# Patient Record
Sex: Male | Born: 1945 | Race: White | Hispanic: No | Smoking: Former smoker
Health system: Southern US, Community
[De-identification: ages and names within clinical notes are randomized; demographics above are authoritative.]

## PROBLEM LIST (undated history)

## (undated) DIAGNOSIS — C189 Malignant neoplasm of colon, unspecified: Secondary | ICD-10-CM

## (undated) DIAGNOSIS — G4733 Obstructive sleep apnea (adult) (pediatric): Secondary | ICD-10-CM

## (undated) DIAGNOSIS — R0789 Other chest pain: Secondary | ICD-10-CM

## (undated) DIAGNOSIS — D509 Iron deficiency anemia, unspecified: Secondary | ICD-10-CM

## (undated) DIAGNOSIS — E119 Type 2 diabetes mellitus without complications: Secondary | ICD-10-CM

## (undated) DIAGNOSIS — I219 Acute myocardial infarction, unspecified: Secondary | ICD-10-CM

## (undated) DIAGNOSIS — I2510 Atherosclerotic heart disease of native coronary artery without angina pectoris: Secondary | ICD-10-CM

## (undated) DIAGNOSIS — I739 Peripheral vascular disease, unspecified: Secondary | ICD-10-CM

## (undated) DIAGNOSIS — I639 Cerebral infarction, unspecified: Secondary | ICD-10-CM

## (undated) DIAGNOSIS — E669 Obesity, unspecified: Secondary | ICD-10-CM

## (undated) DIAGNOSIS — M489 Spondylopathy, unspecified: Secondary | ICD-10-CM

## (undated) DIAGNOSIS — G40909 Epilepsy, unspecified, not intractable, without status epilepticus: Secondary | ICD-10-CM

## (undated) DIAGNOSIS — K219 Gastro-esophageal reflux disease without esophagitis: Secondary | ICD-10-CM

## (undated) DIAGNOSIS — E782 Mixed hyperlipidemia: Secondary | ICD-10-CM

## (undated) DIAGNOSIS — I779 Disorder of arteries and arterioles, unspecified: Secondary | ICD-10-CM

## (undated) DIAGNOSIS — I480 Paroxysmal atrial fibrillation: Secondary | ICD-10-CM

## (undated) HISTORY — DX: Gastro-esophageal reflux disease without esophagitis: K21.9

## (undated) HISTORY — PX: OTHER SURGICAL HISTORY: SHX169

## (undated) HISTORY — PX: FEMORAL ARTERY - POPLITEAL ARTERY BYPASS GRAFT: SUR180

## (undated) HISTORY — DX: Peripheral vascular disease, unspecified: I73.9

## (undated) HISTORY — DX: Type 2 diabetes mellitus without complications: E11.9

## (undated) HISTORY — DX: Disorder of arteries and arterioles, unspecified: I77.9

## (undated) HISTORY — DX: Mixed hyperlipidemia: E78.2

## (undated) HISTORY — PX: CAROTID ENDARTERECTOMY: SUR193

## (undated) HISTORY — DX: Obstructive sleep apnea (adult) (pediatric): G47.33

## (undated) HISTORY — DX: Atherosclerotic heart disease of native coronary artery without angina pectoris: I25.10

## (undated) HISTORY — DX: Paroxysmal atrial fibrillation: I48.0

## (undated) HISTORY — PX: PARTIAL COLECTOMY: SHX5273

## (undated) HISTORY — DX: Spondylopathy, unspecified: M48.9

## (undated) HISTORY — PX: CHOLECYSTECTOMY: SHX55

## (undated) HISTORY — DX: Iron deficiency anemia, unspecified: D50.9

## (undated) HISTORY — PX: TONSILLECTOMY AND ADENOIDECTOMY: SUR1326

## (undated) HISTORY — DX: Obesity, unspecified: E66.9

## (undated) HISTORY — DX: Epilepsy, unspecified, not intractable, without status epilepticus: G40.909

## (undated) HISTORY — DX: Cerebral infarction, unspecified: I63.9

## (undated) HISTORY — DX: Other chest pain: R07.89

---

## 1997-09-27 ENCOUNTER — Ambulatory Visit (HOSPITAL_COMMUNITY): Admission: RE | Admit: 1997-09-27 | Discharge: 1997-09-27 | Payer: Self-pay | Admitting: Neurosurgery

## 1997-10-30 ENCOUNTER — Ambulatory Visit (HOSPITAL_COMMUNITY): Admission: RE | Admit: 1997-10-30 | Discharge: 1997-10-30 | Payer: Self-pay | Admitting: Neurosurgery

## 1999-09-01 ENCOUNTER — Encounter: Payer: Self-pay | Admitting: Emergency Medicine

## 1999-09-01 ENCOUNTER — Inpatient Hospital Stay (HOSPITAL_COMMUNITY): Admission: EM | Admit: 1999-09-01 | Discharge: 1999-09-07 | Payer: Self-pay | Admitting: Emergency Medicine

## 1999-11-16 ENCOUNTER — Encounter: Admission: RE | Admit: 1999-11-16 | Discharge: 1999-11-16 | Payer: Self-pay | Admitting: Orthopedic Surgery

## 1999-11-16 ENCOUNTER — Encounter: Payer: Self-pay | Admitting: Orthopedic Surgery

## 1999-12-31 ENCOUNTER — Encounter: Payer: Self-pay | Admitting: Orthopedic Surgery

## 2000-01-06 ENCOUNTER — Inpatient Hospital Stay (HOSPITAL_COMMUNITY): Admission: RE | Admit: 2000-01-06 | Discharge: 2000-01-07 | Payer: Self-pay | Admitting: Orthopedic Surgery

## 2000-02-02 ENCOUNTER — Ambulatory Visit (HOSPITAL_COMMUNITY): Admission: RE | Admit: 2000-02-02 | Discharge: 2000-02-02 | Payer: Self-pay | Admitting: *Deleted

## 2000-02-18 ENCOUNTER — Ambulatory Visit (HOSPITAL_COMMUNITY): Admission: RE | Admit: 2000-02-18 | Discharge: 2000-02-19 | Payer: Self-pay | Admitting: *Deleted

## 2000-02-21 ENCOUNTER — Inpatient Hospital Stay (HOSPITAL_COMMUNITY): Admission: EM | Admit: 2000-02-21 | Discharge: 2000-02-23 | Payer: Self-pay | Admitting: Interventional Cardiology

## 2001-02-22 ENCOUNTER — Encounter: Payer: Self-pay | Admitting: *Deleted

## 2001-02-24 ENCOUNTER — Ambulatory Visit (HOSPITAL_COMMUNITY): Admission: RE | Admit: 2001-02-24 | Discharge: 2001-02-24 | Payer: Self-pay | Admitting: *Deleted

## 2001-03-28 ENCOUNTER — Inpatient Hospital Stay (HOSPITAL_COMMUNITY): Admission: RE | Admit: 2001-03-28 | Discharge: 2001-04-01 | Payer: Self-pay | Admitting: *Deleted

## 2001-03-29 ENCOUNTER — Encounter: Payer: Self-pay | Admitting: *Deleted

## 2002-04-19 DIAGNOSIS — I639 Cerebral infarction, unspecified: Secondary | ICD-10-CM

## 2002-04-19 HISTORY — DX: Cerebral infarction, unspecified: I63.9

## 2002-09-11 ENCOUNTER — Encounter: Payer: Self-pay | Admitting: *Deleted

## 2002-09-13 ENCOUNTER — Ambulatory Visit (HOSPITAL_COMMUNITY): Admission: RE | Admit: 2002-09-13 | Discharge: 2002-09-13 | Payer: Self-pay | Admitting: *Deleted

## 2002-09-19 ENCOUNTER — Inpatient Hospital Stay (HOSPITAL_COMMUNITY): Admission: RE | Admit: 2002-09-19 | Discharge: 2002-09-23 | Payer: Self-pay | Admitting: *Deleted

## 2002-09-20 ENCOUNTER — Encounter: Payer: Self-pay | Admitting: *Deleted

## 2003-04-17 ENCOUNTER — Inpatient Hospital Stay (HOSPITAL_COMMUNITY): Admission: EM | Admit: 2003-04-17 | Discharge: 2003-04-27 | Payer: Self-pay | Admitting: Neurology

## 2003-04-25 ENCOUNTER — Encounter (INDEPENDENT_AMBULATORY_CARE_PROVIDER_SITE_OTHER): Payer: Self-pay | Admitting: Specialist

## 2003-05-07 ENCOUNTER — Encounter (HOSPITAL_COMMUNITY): Admission: RE | Admit: 2003-05-07 | Discharge: 2003-06-06 | Payer: Self-pay | Admitting: Neurology

## 2003-06-11 ENCOUNTER — Encounter (HOSPITAL_COMMUNITY): Admission: RE | Admit: 2003-06-11 | Discharge: 2003-07-11 | Payer: Self-pay | Admitting: Neurology

## 2003-07-16 ENCOUNTER — Encounter (HOSPITAL_COMMUNITY): Admission: RE | Admit: 2003-07-16 | Discharge: 2003-08-15 | Payer: Self-pay | Admitting: Neurology

## 2003-08-20 ENCOUNTER — Encounter (HOSPITAL_COMMUNITY): Admission: RE | Admit: 2003-08-20 | Discharge: 2003-09-19 | Payer: Self-pay | Admitting: Neurology

## 2003-09-19 ENCOUNTER — Encounter (HOSPITAL_COMMUNITY): Admission: RE | Admit: 2003-09-19 | Discharge: 2003-10-22 | Payer: Self-pay | Admitting: Neurology

## 2003-10-24 ENCOUNTER — Encounter (HOSPITAL_COMMUNITY): Admission: RE | Admit: 2003-10-24 | Discharge: 2003-11-23 | Payer: Self-pay | Admitting: Neurology

## 2003-11-26 ENCOUNTER — Encounter (HOSPITAL_COMMUNITY): Admission: RE | Admit: 2003-11-26 | Discharge: 2003-12-26 | Payer: Self-pay | Admitting: Neurology

## 2004-01-22 ENCOUNTER — Encounter (HOSPITAL_COMMUNITY): Admission: RE | Admit: 2004-01-22 | Discharge: 2004-02-25 | Payer: Self-pay | Admitting: Neurology

## 2004-07-07 ENCOUNTER — Inpatient Hospital Stay (HOSPITAL_COMMUNITY): Admission: EM | Admit: 2004-07-07 | Discharge: 2004-07-12 | Payer: Self-pay | Admitting: Emergency Medicine

## 2004-12-22 ENCOUNTER — Encounter (HOSPITAL_COMMUNITY): Admission: RE | Admit: 2004-12-22 | Discharge: 2005-01-15 | Payer: Self-pay | Admitting: Neurology

## 2005-04-15 ENCOUNTER — Ambulatory Visit: Payer: Self-pay | Admitting: Cardiology

## 2005-04-19 DIAGNOSIS — C189 Malignant neoplasm of colon, unspecified: Secondary | ICD-10-CM

## 2005-04-19 HISTORY — DX: Malignant neoplasm of colon, unspecified: C18.9

## 2005-05-18 ENCOUNTER — Emergency Department (HOSPITAL_COMMUNITY): Admission: EM | Admit: 2005-05-18 | Discharge: 2005-05-18 | Payer: Self-pay | Admitting: Emergency Medicine

## 2005-10-28 ENCOUNTER — Ambulatory Visit: Payer: Self-pay | Admitting: Cardiology

## 2005-11-08 ENCOUNTER — Encounter: Admission: RE | Admit: 2005-11-08 | Discharge: 2005-11-08 | Payer: Self-pay | Admitting: Cardiology

## 2005-11-10 ENCOUNTER — Encounter: Admission: RE | Admit: 2005-11-10 | Discharge: 2005-11-10 | Payer: Self-pay | Admitting: Cardiology

## 2005-11-25 ENCOUNTER — Ambulatory Visit: Payer: Self-pay | Admitting: Cardiology

## 2006-02-10 ENCOUNTER — Emergency Department (HOSPITAL_COMMUNITY): Admission: EM | Admit: 2006-02-10 | Discharge: 2006-02-10 | Payer: Self-pay | Admitting: *Deleted

## 2006-05-20 HISTORY — PX: CORONARY ARTERY BYPASS GRAFT: SHX141

## 2006-06-05 ENCOUNTER — Inpatient Hospital Stay (HOSPITAL_COMMUNITY): Admission: EM | Admit: 2006-06-05 | Discharge: 2006-06-17 | Payer: Self-pay | Admitting: Emergency Medicine

## 2006-06-06 ENCOUNTER — Encounter: Payer: Self-pay | Admitting: Vascular Surgery

## 2006-06-06 ENCOUNTER — Ambulatory Visit: Payer: Self-pay | Admitting: Surgery

## 2006-06-23 ENCOUNTER — Ambulatory Visit: Payer: Self-pay | Admitting: Cardiothoracic Surgery

## 2006-06-27 ENCOUNTER — Inpatient Hospital Stay (HOSPITAL_COMMUNITY): Admission: AD | Admit: 2006-06-27 | Discharge: 2006-07-01 | Payer: Self-pay | Admitting: Cardiothoracic Surgery

## 2006-06-27 ENCOUNTER — Ambulatory Visit: Payer: Self-pay | Admitting: Cardiothoracic Surgery

## 2006-07-12 ENCOUNTER — Ambulatory Visit: Payer: Self-pay | Admitting: Surgery

## 2006-07-26 ENCOUNTER — Ambulatory Visit: Payer: Self-pay | Admitting: Surgery

## 2006-09-22 ENCOUNTER — Encounter: Admission: RE | Admit: 2006-09-22 | Discharge: 2006-09-22 | Payer: Self-pay | Admitting: Cardiology

## 2006-09-23 ENCOUNTER — Ambulatory Visit: Payer: Self-pay | Admitting: Hematology & Oncology

## 2006-10-07 LAB — CBC WITH DIFFERENTIAL/PLATELET
EOS%: 3.3 % (ref 0.0–7.0)
LYMPH%: 44.3 % (ref 14.0–48.0)
MCH: 24.9 pg — ABNORMAL LOW (ref 28.0–33.4)
MCHC: 33.4 g/dL (ref 32.0–35.9)
MCV: 74.7 fL — ABNORMAL LOW (ref 81.6–98.0)
MONO%: 13.2 % — ABNORMAL HIGH (ref 0.0–13.0)
Platelets: 211 10*3/uL (ref 145–400)
RBC: 3.79 10*6/uL — ABNORMAL LOW (ref 4.20–5.71)
RDW: 18.2 % — ABNORMAL HIGH (ref 11.2–14.6)

## 2006-10-10 LAB — COMPREHENSIVE METABOLIC PANEL
Albumin: 3.9 g/dL (ref 3.5–5.2)
Alkaline Phosphatase: 50 U/L (ref 39–117)
Calcium: 8.5 mg/dL (ref 8.4–10.5)
Chloride: 104 mEq/L (ref 96–112)
Glucose, Bld: 102 mg/dL — ABNORMAL HIGH (ref 70–99)
Potassium: 4.9 mEq/L (ref 3.5–5.3)
Sodium: 139 mEq/L (ref 135–145)
Total Protein: 7.9 g/dL (ref 6.0–8.3)

## 2006-10-10 LAB — LACTATE DEHYDROGENASE: LDH: 120 U/L (ref 94–250)

## 2006-10-10 LAB — ERYTHROPOIETIN: Erythropoietin: 65.4 m[IU]/mL — ABNORMAL HIGH (ref 2.6–34.0)

## 2006-11-23 ENCOUNTER — Ambulatory Visit: Payer: Self-pay | Admitting: Hematology & Oncology

## 2006-11-25 LAB — CBC & DIFF AND RETIC
BASO%: 0.5 % (ref 0.0–2.0)
EOS%: 2.5 % (ref 0.0–7.0)
HCT: 33.6 % — ABNORMAL LOW (ref 38.7–49.9)
LYMPH%: 41 % (ref 14.0–48.0)
MCH: 28.6 pg (ref 28.0–33.4)
MCHC: 34.5 g/dL (ref 32.0–35.9)
NEUT%: 43.8 % (ref 40.0–75.0)
Platelets: 250 10*3/uL (ref 145–400)
lymph#: 2 10*3/uL (ref 0.9–3.3)

## 2007-01-03 ENCOUNTER — Ambulatory Visit: Payer: Self-pay | Admitting: Hematology & Oncology

## 2007-02-13 LAB — CBC & DIFF AND RETIC
Basophils Absolute: 0 10*3/uL (ref 0.0–0.1)
Eosinophils Absolute: 0.1 10*3/uL (ref 0.0–0.5)
HGB: 12.3 g/dL — ABNORMAL LOW (ref 13.0–17.1)
LYMPH%: 37.8 % (ref 14.0–48.0)
MCV: 89.9 fL (ref 81.6–98.0)
MONO%: 8.6 % (ref 0.0–13.0)
NEUT#: 2.9 10*3/uL (ref 1.5–6.5)
Platelets: 247 10*3/uL (ref 145–400)
RETIC #: 65.2 10*3/uL (ref 31.8–103.9)

## 2007-04-06 ENCOUNTER — Ambulatory Visit: Payer: Self-pay | Admitting: Hematology & Oncology

## 2007-04-10 LAB — CBC & DIFF AND RETIC
Basophils Absolute: 0.1 10*3/uL (ref 0.0–0.1)
Eosinophils Absolute: 0.2 10*3/uL (ref 0.0–0.5)
HGB: 13.4 g/dL (ref 13.0–17.1)
IRF: 0.29 (ref 0.070–0.380)
MONO#: 0.6 10*3/uL (ref 0.1–0.9)
MONO%: 11.1 % (ref 0.0–13.0)
NEUT#: 2.1 10*3/uL (ref 1.5–6.5)
RBC: 4.13 10*6/uL — ABNORMAL LOW (ref 4.20–5.71)
RDW: 13.6 % (ref 11.2–14.6)
RETIC #: 35.9 10*3/uL (ref 31.8–103.9)
Retic %: 0.9 % (ref 0.7–2.3)
WBC: 5.1 10*3/uL (ref 4.0–10.0)
lymph#: 2.2 10*3/uL (ref 0.9–3.3)

## 2007-04-10 LAB — FERRITIN: Ferritin: 255 ng/mL (ref 22–322)

## 2007-05-04 ENCOUNTER — Ambulatory Visit: Payer: Self-pay | Admitting: *Deleted

## 2007-07-11 ENCOUNTER — Ambulatory Visit: Payer: Self-pay | Admitting: Hematology & Oncology

## 2007-09-25 ENCOUNTER — Ambulatory Visit: Payer: Self-pay | Admitting: Vascular Surgery

## 2007-10-05 ENCOUNTER — Ambulatory Visit: Payer: Self-pay | Admitting: Hematology & Oncology

## 2007-10-09 LAB — CBC & DIFF AND RETIC
BASO%: 0.4 % (ref 0.0–2.0)
EOS%: 2.2 % (ref 0.0–7.0)
Eosinophils Absolute: 0.1 10*3/uL (ref 0.0–0.5)
LYMPH%: 36.9 % (ref 14.0–48.0)
MCH: 31.2 pg (ref 28.0–33.4)
MCHC: 35.1 g/dL (ref 32.0–35.9)
MCV: 88.8 fL (ref 81.6–98.0)
MONO%: 10.5 % (ref 0.0–13.0)
NEUT#: 2.5 10*3/uL (ref 1.5–6.5)
RBC: 3.82 10*6/uL — ABNORMAL LOW (ref 4.20–5.71)
RDW: 13 % (ref 11.2–14.6)
RETIC #: 51.6 10*3/uL (ref 31.8–103.9)
Retic %: 1.4 % (ref 0.7–2.3)

## 2007-11-30 ENCOUNTER — Ambulatory Visit: Payer: Self-pay | Admitting: Hematology & Oncology

## 2007-12-04 LAB — FERRITIN: Ferritin: 292 ng/mL (ref 22–322)

## 2007-12-04 LAB — CBC WITH DIFFERENTIAL (CANCER CENTER ONLY)
BASO%: 0.5 % (ref 0.0–2.0)
EOS%: 4.5 % (ref 0.0–7.0)
HCT: 36.7 % — ABNORMAL LOW (ref 38.7–49.9)
LYMPH#: 2.2 10*3/uL (ref 0.9–3.3)
LYMPH%: 50 % — ABNORMAL HIGH (ref 14.0–48.0)
MCHC: 35.3 g/dL (ref 32.0–35.9)
NEUT%: 35.5 % — ABNORMAL LOW (ref 40.0–80.0)
Platelets: 233 10*3/uL (ref 145–400)
RDW: 12.3 % (ref 10.5–14.6)
WBC: 4.4 10*3/uL (ref 4.0–10.0)

## 2007-12-04 LAB — RETICULOCYTES (CHCC)
RBC.: 4.19 MIL/uL — ABNORMAL LOW (ref 4.22–5.81)
Retic Ct Pct: 1.5 % (ref 0.4–3.1)

## 2008-01-24 ENCOUNTER — Encounter: Payer: Self-pay | Admitting: Gastroenterology

## 2008-01-24 DIAGNOSIS — R933 Abnormal findings on diagnostic imaging of other parts of digestive tract: Secondary | ICD-10-CM | POA: Insufficient documentation

## 2008-03-05 ENCOUNTER — Ambulatory Visit: Payer: Self-pay | Admitting: Hematology & Oncology

## 2008-03-06 LAB — CBC WITH DIFFERENTIAL (CANCER CENTER ONLY)
BASO%: 0.6 % (ref 0.0–2.0)
EOS%: 4.3 % (ref 0.0–7.0)
HCT: 35.7 % — ABNORMAL LOW (ref 38.7–49.9)
LYMPH%: 47.1 % (ref 14.0–48.0)
MCH: 30.9 pg (ref 28.0–33.4)
MCHC: 34.2 g/dL (ref 32.0–35.9)
MCV: 90 fL (ref 82–98)
MONO#: 0.6 10*3/uL (ref 0.1–0.9)
MONO%: 13.3 % — ABNORMAL HIGH (ref 0.0–13.0)
NEUT%: 34.7 % — ABNORMAL LOW (ref 40.0–80.0)
Platelets: 218 10*3/uL (ref 145–400)
RDW: 12.6 % (ref 10.5–14.6)

## 2008-03-06 LAB — CHCC SATELLITE - SMEAR

## 2008-03-06 LAB — RETICULOCYTES (CHCC): ABS Retic: 55.2 10*3/uL (ref 19.0–186.0)

## 2008-05-02 ENCOUNTER — Ambulatory Visit: Payer: Self-pay | Admitting: *Deleted

## 2008-07-03 ENCOUNTER — Ambulatory Visit: Payer: Self-pay | Admitting: Hematology & Oncology

## 2008-07-04 LAB — FERRITIN: Ferritin: 167 ng/mL (ref 22–322)

## 2008-07-04 LAB — RETICULOCYTES (CHCC)
RBC.: 4.29 MIL/uL (ref 4.22–5.81)
Retic Ct Pct: 1.1 % (ref 0.4–3.1)

## 2008-07-04 LAB — CBC WITH DIFFERENTIAL (CANCER CENTER ONLY)
BASO#: 0 10*3/uL (ref 0.0–0.2)
Eosinophils Absolute: 0.2 10*3/uL (ref 0.0–0.5)
HCT: 40.4 % (ref 38.7–49.9)
HGB: 13.5 g/dL (ref 13.0–17.1)
LYMPH#: 1.7 10*3/uL (ref 0.9–3.3)
MCV: 93 fL (ref 82–98)
MONO#: 0.4 10*3/uL (ref 0.1–0.9)
NEUT%: 47.4 % (ref 40.0–80.0)
WBC: 4.4 10*3/uL (ref 4.0–10.0)

## 2008-07-04 LAB — CHCC SATELLITE - SMEAR

## 2008-10-23 ENCOUNTER — Ambulatory Visit: Payer: Self-pay | Admitting: Hematology & Oncology

## 2008-10-23 LAB — RETICULOCYTES (CHCC)
RBC.: 4.08 MIL/uL — ABNORMAL LOW (ref 4.22–5.81)
Retic Ct Pct: 1.2 % (ref 0.4–3.1)

## 2008-10-23 LAB — CBC WITH DIFFERENTIAL (CANCER CENTER ONLY)
BASO%: 0.4 % (ref 0.0–2.0)
Eosinophils Absolute: 0.2 10*3/uL (ref 0.0–0.5)
HCT: 37.7 % — ABNORMAL LOW (ref 38.7–49.9)
LYMPH#: 1.9 10*3/uL (ref 0.9–3.3)
MONO#: 0.5 10*3/uL (ref 0.1–0.9)
NEUT%: 36.2 % — ABNORMAL LOW (ref 40.0–80.0)
Platelets: 180 10*3/uL (ref 145–400)
RBC: 4.11 10*6/uL — ABNORMAL LOW (ref 4.20–5.70)
RDW: 12.6 % (ref 10.5–14.6)
WBC: 4.2 10*3/uL (ref 4.0–10.0)

## 2008-10-23 LAB — CHCC SATELLITE - SMEAR

## 2009-02-26 ENCOUNTER — Ambulatory Visit: Payer: Self-pay | Admitting: Hematology & Oncology

## 2009-02-27 LAB — RETICULOCYTES (CHCC): ABS Retic: 42 10*3/uL (ref 19.0–186.0)

## 2009-02-27 LAB — CBC WITH DIFFERENTIAL (CANCER CENTER ONLY)
BASO%: 0.7 % (ref 0.0–2.0)
Eosinophils Absolute: 0.2 10*3/uL (ref 0.0–0.5)
LYMPH%: 48.1 % — ABNORMAL HIGH (ref 14.0–48.0)
MCV: 88 fL (ref 82–98)
MONO#: 0.4 10*3/uL (ref 0.1–0.9)
NEUT#: 1.4 10*3/uL — ABNORMAL LOW (ref 1.5–6.5)
Platelets: 166 10*3/uL (ref 145–400)
RBC: 4.13 10*6/uL — ABNORMAL LOW (ref 4.20–5.70)
RDW: 12.5 % (ref 10.5–14.6)
WBC: 4 10*3/uL (ref 4.0–10.0)

## 2009-02-27 LAB — FERRITIN: Ferritin: 269 ng/mL (ref 22–322)

## 2009-05-07 ENCOUNTER — Ambulatory Visit: Payer: Self-pay | Admitting: Vascular Surgery

## 2009-05-20 ENCOUNTER — Ambulatory Visit: Payer: Self-pay | Admitting: Vascular Surgery

## 2009-06-03 ENCOUNTER — Ambulatory Visit: Payer: Self-pay | Admitting: Surgery

## 2009-06-03 ENCOUNTER — Inpatient Hospital Stay (HOSPITAL_COMMUNITY): Admission: AD | Admit: 2009-06-03 | Discharge: 2009-06-08 | Payer: Self-pay | Admitting: Surgery

## 2009-06-05 ENCOUNTER — Ambulatory Visit: Payer: Self-pay | Admitting: Vascular Surgery

## 2009-06-05 ENCOUNTER — Encounter: Payer: Self-pay | Admitting: Surgery

## 2009-07-01 ENCOUNTER — Ambulatory Visit: Payer: Self-pay | Admitting: Vascular Surgery

## 2009-07-29 ENCOUNTER — Ambulatory Visit: Payer: Self-pay | Admitting: Hematology & Oncology

## 2009-07-31 LAB — CBC WITH DIFFERENTIAL (CANCER CENTER ONLY)
EOS%: 3.6 % (ref 0.0–7.0)
Eosinophils Absolute: 0.2 10*3/uL (ref 0.0–0.5)
HCT: 33.4 % — ABNORMAL LOW (ref 38.7–49.9)
HGB: 11.2 g/dL — ABNORMAL LOW (ref 13.0–17.1)
LYMPH#: 1.6 10*3/uL (ref 0.9–3.3)
MCH: 30.9 pg (ref 28.0–33.4)
MCV: 92 fL (ref 82–98)
RBC: 3.64 10*6/uL — ABNORMAL LOW (ref 4.20–5.70)
WBC: 5.4 10*3/uL (ref 4.0–10.0)

## 2009-07-31 LAB — RETICULOCYTES (CHCC)
RBC.: 3.67 MIL/uL — ABNORMAL LOW (ref 4.22–5.81)
Retic Ct Pct: 0.9 % (ref 0.4–3.1)

## 2009-10-08 ENCOUNTER — Ambulatory Visit: Payer: Self-pay | Admitting: Vascular Surgery

## 2009-10-10 ENCOUNTER — Inpatient Hospital Stay (HOSPITAL_COMMUNITY): Admission: EM | Admit: 2009-10-10 | Discharge: 2009-10-11 | Payer: Self-pay | Admitting: Emergency Medicine

## 2009-10-16 ENCOUNTER — Ambulatory Visit: Payer: Self-pay | Admitting: Vascular Surgery

## 2009-10-16 ENCOUNTER — Ambulatory Visit (HOSPITAL_COMMUNITY): Admission: RE | Admit: 2009-10-16 | Discharge: 2009-10-16 | Payer: Self-pay | Admitting: Vascular Surgery

## 2009-10-22 ENCOUNTER — Ambulatory Visit: Payer: Self-pay | Admitting: Hematology & Oncology

## 2009-11-18 ENCOUNTER — Ambulatory Visit: Payer: Self-pay | Admitting: Vascular Surgery

## 2009-11-19 ENCOUNTER — Inpatient Hospital Stay (HOSPITAL_COMMUNITY): Admission: RE | Admit: 2009-11-19 | Discharge: 2009-11-25 | Payer: Self-pay | Admitting: Vascular Surgery

## 2009-11-19 ENCOUNTER — Ambulatory Visit: Payer: Self-pay | Admitting: Vascular Surgery

## 2009-11-21 ENCOUNTER — Encounter: Payer: Self-pay | Admitting: Vascular Surgery

## 2009-12-09 ENCOUNTER — Ambulatory Visit: Payer: Self-pay | Admitting: Vascular Surgery

## 2010-02-13 ENCOUNTER — Ambulatory Visit: Payer: Self-pay | Admitting: Cardiovascular Disease

## 2010-03-10 ENCOUNTER — Ambulatory Visit: Payer: Self-pay | Admitting: Vascular Surgery

## 2010-05-10 ENCOUNTER — Encounter: Payer: Self-pay | Admitting: Cardiology

## 2010-06-15 ENCOUNTER — Ambulatory Visit (INDEPENDENT_AMBULATORY_CARE_PROVIDER_SITE_OTHER): Payer: Medicare Other | Admitting: Cardiology

## 2010-06-15 DIAGNOSIS — M79609 Pain in unspecified limb: Secondary | ICD-10-CM

## 2010-06-15 DIAGNOSIS — I2510 Atherosclerotic heart disease of native coronary artery without angina pectoris: Secondary | ICD-10-CM

## 2010-07-03 LAB — BASIC METABOLIC PANEL
BUN: 18 mg/dL (ref 6–23)
CO2: 29 mEq/L (ref 19–32)
CO2: 29 mEq/L (ref 19–32)
Calcium: 7.7 mg/dL — ABNORMAL LOW (ref 8.4–10.5)
Chloride: 102 mEq/L (ref 96–112)
Chloride: 106 mEq/L (ref 96–112)
Chloride: 107 mEq/L (ref 96–112)
Creatinine, Ser: 0.59 mg/dL (ref 0.4–1.5)
Creatinine, Ser: 0.64 mg/dL (ref 0.4–1.5)
GFR calc Af Amer: >60 mL/min (ref >60)
GFR calc Af Amer: >60 mL/min (ref >60)
GFR calc non Af Amer: >60 mL/min (ref >60)
Glucose, Bld: 118 mg/dL — ABNORMAL HIGH (ref 70–99)
Potassium: 3.6 mEq/L (ref 3.5–5.1)
Potassium: 4.2 mEq/L (ref 3.5–5.1)
Sodium: 138 mEq/L (ref 135–145)
Sodium: 140 mEq/L (ref 135–145)

## 2010-07-03 LAB — BLOOD GAS, ARTERIAL
Acid-Base Excess: 2.4 mmol/L — ABNORMAL HIGH (ref 0.0–2.0)
O2 Saturation: 97.2 %
TCO2: 28.4 mmol/L (ref 0–100)
pCO2 arterial: 46.3 mmHg — ABNORMAL HIGH (ref 35.0–45.0)
pO2, Arterial: 82.2 mmHg (ref 80.0–100.0)

## 2010-07-03 LAB — URINALYSIS, ROUTINE W REFLEX MICROSCOPIC
Bilirubin Urine: NEGATIVE
Glucose, UA: NEGATIVE mg/dL
Hgb urine dipstick: NEGATIVE
Ketones, ur: 15 mg/dL — AB
Ketones, ur: NEGATIVE mg/dL
Leukocytes, UA: NEGATIVE
Nitrite: NEGATIVE
Nitrite: NEGATIVE
Specific Gravity, Urine: 1.019 (ref 1.005–1.030)
Urobilinogen, UA: 1 mg/dL (ref 0.0–1.0)
pH: 6 (ref 5.0–8.0)
pH: 6 (ref 5.0–8.0)

## 2010-07-03 LAB — PROTIME-INR
INR: 1.04 (ref 0.00–1.49)
INR: 1.28 (ref 0.00–1.49)
INR: 2.41 — ABNORMAL HIGH (ref 0.00–1.49)
INR: 2.89 — ABNORMAL HIGH (ref 0.00–1.49)
Prothrombin Time: 13.8 seconds (ref 11.6–15.2)
Prothrombin Time: 16.2 seconds — ABNORMAL HIGH (ref 11.6–15.2)
Prothrombin Time: 26.4 seconds — ABNORMAL HIGH (ref 11.6–15.2)
Prothrombin Time: 30.3 seconds — ABNORMAL HIGH (ref 11.6–15.2)
Prothrombin Time: 35.8 seconds — ABNORMAL HIGH (ref 11.6–15.2)

## 2010-07-03 LAB — COMPREHENSIVE METABOLIC PANEL
ALT: 8 U/L (ref 0–53)
Calcium: 8.6 mg/dL (ref 8.4–10.5)
GFR calc non Af Amer: >60 mL/min (ref >60)
Glucose, Bld: 123 mg/dL — ABNORMAL HIGH (ref 70–99)
Sodium: 139 mEq/L (ref 135–145)
Total Bilirubin: 0.4 mg/dL (ref 0.3–1.2)
Total Protein: 7.5 g/dL (ref 6.0–8.3)

## 2010-07-03 LAB — CBC
HCT: 25.1 % — ABNORMAL LOW (ref 39.0–52.0)
HCT: 28.4 % — ABNORMAL LOW (ref 39.0–52.0)
HCT: 28.6 % — ABNORMAL LOW (ref 39.0–52.0)
Hemoglobin: 8.2 g/dL — ABNORMAL LOW (ref 13.0–17.0)
Hemoglobin: 9.3 g/dL — ABNORMAL LOW (ref 13.0–17.0)
Hemoglobin: 9.5 g/dL — ABNORMAL LOW (ref 13.0–17.0)
Hemoglobin: 9.7 g/dL — ABNORMAL LOW (ref 13.0–17.0)
MCH: 31.3 pg (ref 26.0–34.0)
MCH: 31.3 pg (ref 26.0–34.0)
MCH: 31.4 pg (ref 26.0–34.0)
MCHC: 32.7 g/dL (ref 30.0–36.0)
MCHC: 33.4 g/dL (ref 30.0–36.0)
MCV: 93.5 fL (ref 78.0–100.0)
MCV: 94.4 fL (ref 78.0–100.0)
MCV: 95.8 fL (ref 78.0–100.0)
Platelets: 145 10*3/uL — ABNORMAL LOW (ref 150–400)
Platelets: 152 10*3/uL (ref 150–400)
RBC: 3.01 MIL/uL — ABNORMAL LOW (ref 4.22–5.81)
RBC: 3.09 MIL/uL — ABNORMAL LOW (ref 4.22–5.81)
RBC: 3.26 MIL/uL — ABNORMAL LOW (ref 4.22–5.81)
RBC: 3.58 MIL/uL — ABNORMAL LOW (ref 4.22–5.81)
RDW: 13.7 % (ref 11.5–15.5)
RDW: 13.7 % (ref 11.5–15.5)
WBC: 19.9 10*3/uL — ABNORMAL HIGH (ref 4.0–10.5)
WBC: 5.4 10*3/uL (ref 4.0–10.5)
WBC: 6.2 10*3/uL (ref 4.0–10.5)

## 2010-07-03 LAB — GLUCOSE, CAPILLARY
Glucose-Capillary: 101 mg/dL — ABNORMAL HIGH (ref 70–99)
Glucose-Capillary: 102 mg/dL — ABNORMAL HIGH (ref 70–99)
Glucose-Capillary: 103 mg/dL — ABNORMAL HIGH (ref 70–99)
Glucose-Capillary: 126 mg/dL — ABNORMAL HIGH (ref 70–99)
Glucose-Capillary: 141 mg/dL — ABNORMAL HIGH (ref 70–99)
Glucose-Capillary: 70 mg/dL (ref 70–99)
Glucose-Capillary: 72 mg/dL (ref 70–99)
Glucose-Capillary: 82 mg/dL (ref 70–99)
Glucose-Capillary: 84 mg/dL (ref 70–99)
Glucose-Capillary: 92 mg/dL (ref 70–99)

## 2010-07-03 LAB — CROSSMATCH: ABO/RH(D): A POS

## 2010-07-03 LAB — HEPARIN LEVEL (UNFRACTIONATED): Heparin Unfractionated: <0.1 IU/mL — ABNORMAL LOW (ref 0.30–0.70)

## 2010-07-03 LAB — SURGICAL PCR SCREEN: MRSA, PCR: NEGATIVE

## 2010-07-03 LAB — APTT: aPTT: 38 seconds — ABNORMAL HIGH (ref 24–37)

## 2010-07-03 LAB — HEMOGLOBIN A1C: Hgb A1c MFr Bld: 6.6 % — ABNORMAL HIGH (ref <5.7)

## 2010-07-05 LAB — CBC
MCHC: 34.2 g/dL (ref 30.0–36.0)
Platelets: 180 10*3/uL (ref 150–400)
RDW: 13.1 % (ref 11.5–15.5)
WBC: 9.3 10*3/uL (ref 4.0–10.5)

## 2010-07-05 LAB — CULTURE, BLOOD (ROUTINE X 2)
Culture: NO GROWTH
Culture: NO GROWTH

## 2010-07-05 LAB — URINALYSIS, ROUTINE W REFLEX MICROSCOPIC
Nitrite: NEGATIVE
Protein, ur: 30 mg/dL — AB
Specific Gravity, Urine: 1.015 (ref 1.005–1.030)
Urobilinogen, UA: 1 mg/dL (ref 0.0–1.0)

## 2010-07-05 LAB — BASIC METABOLIC PANEL
BUN: 28 mg/dL — ABNORMAL HIGH (ref 6–23)
Calcium: 9 mg/dL (ref 8.4–10.5)
GFR calc non Af Amer: >60 mL/min (ref >60)
Glucose, Bld: 160 mg/dL — ABNORMAL HIGH (ref 70–99)

## 2010-07-05 LAB — GLUCOSE, CAPILLARY
Glucose-Capillary: 128 mg/dL — ABNORMAL HIGH (ref 70–99)
Glucose-Capillary: 92 mg/dL (ref 70–99)

## 2010-07-05 LAB — CK TOTAL AND CKMB (NOT AT ARMC)
CK, MB: 1 ng/mL (ref 0.3–4.0)
CK, MB: 1.1 ng/mL (ref 0.3–4.0)
CK, MB: 1.5 ng/mL (ref 0.3–4.0)
Relative Index: 0.9 (ref 0.0–2.5)
Total CK: 106 U/L (ref 7–232)
Total CK: 80 U/L (ref 7–232)

## 2010-07-05 LAB — POCT I-STAT, CHEM 8
BUN: 22 mg/dL (ref 6–23)
HCT: 33 % — ABNORMAL LOW (ref 39.0–52.0)
Sodium: 144 mEq/L (ref 135–145)
TCO2: 30 mmol/L (ref 0–100)

## 2010-07-05 LAB — DIFFERENTIAL
Basophils Absolute: 0 10*3/uL (ref 0.0–0.1)
Basophils Relative: 0 % (ref 0–1)
Lymphocytes Relative: 13 % (ref 12–46)
Neutro Abs: 7.4 10*3/uL (ref 1.7–7.7)
Neutrophils Relative %: 79 % — ABNORMAL HIGH (ref 43–77)

## 2010-07-05 LAB — CARDIAC PANEL(CRET KIN+CKTOT+MB+TROPI)
CK, MB: 1.1 ng/mL (ref 0.3–4.0)
Total CK: 71 U/L (ref 7–232)
Troponin I: 0.02 ng/mL (ref 0.00–0.06)

## 2010-07-05 LAB — LACTIC ACID, PLASMA: Lactic Acid, Venous: 2.8 mmol/L — ABNORMAL HIGH (ref 0.5–2.2)

## 2010-07-05 LAB — HEMOGLOBIN A1C: Mean Plasma Glucose: 140 mg/dL — ABNORMAL HIGH (ref <117)

## 2010-07-05 LAB — URINE CULTURE: Colony Count: NO GROWTH

## 2010-07-05 LAB — BRAIN NATRIURETIC PEPTIDE: Pro B Natriuretic peptide (BNP): 504 pg/mL — ABNORMAL HIGH (ref 0.0–100.0)

## 2010-07-05 LAB — URINE MICROSCOPIC-ADD ON

## 2010-07-08 LAB — GLUCOSE, CAPILLARY
Glucose-Capillary: 101 mg/dL — ABNORMAL HIGH (ref 70–99)
Glucose-Capillary: 104 mg/dL — ABNORMAL HIGH (ref 70–99)
Glucose-Capillary: 107 mg/dL — ABNORMAL HIGH (ref 70–99)
Glucose-Capillary: 111 mg/dL — ABNORMAL HIGH (ref 70–99)
Glucose-Capillary: 119 mg/dL — ABNORMAL HIGH (ref 70–99)
Glucose-Capillary: 125 mg/dL — ABNORMAL HIGH (ref 70–99)
Glucose-Capillary: 125 mg/dL — ABNORMAL HIGH (ref 70–99)
Glucose-Capillary: 131 mg/dL — ABNORMAL HIGH (ref 70–99)
Glucose-Capillary: 145 mg/dL — ABNORMAL HIGH (ref 70–99)
Glucose-Capillary: 146 mg/dL — ABNORMAL HIGH (ref 70–99)
Glucose-Capillary: 149 mg/dL — ABNORMAL HIGH (ref 70–99)
Glucose-Capillary: 149 mg/dL — ABNORMAL HIGH (ref 70–99)
Glucose-Capillary: 87 mg/dL (ref 70–99)
Glucose-Capillary: 90 mg/dL (ref 70–99)
Glucose-Capillary: 98 mg/dL (ref 70–99)

## 2010-07-08 LAB — HEMOGLOBIN A1C
Hgb A1c MFr Bld: 6.4 % — ABNORMAL HIGH (ref 4.6–6.1)
Mean Plasma Glucose: 137 mg/dL

## 2010-07-08 LAB — POCT I-STAT, CHEM 8
Calcium, Ion: 1.15 mmol/L (ref 1.12–1.32)
Chloride: 105 mEq/L (ref 96–112)
Glucose, Bld: 121 mg/dL — ABNORMAL HIGH (ref 70–99)
HCT: 33 % — ABNORMAL LOW (ref 39.0–52.0)
Hemoglobin: 11.2 g/dL — ABNORMAL LOW (ref 13.0–17.0)
TCO2: 27 mmol/L (ref 0–100)

## 2010-07-08 LAB — BASIC METABOLIC PANEL
BUN: 15 mg/dL (ref 6–23)
BUN: 18 mg/dL (ref 6–23)
BUN: 38 mg/dL — ABNORMAL HIGH (ref 6–23)
CO2: 26 mEq/L (ref 19–32)
Calcium: 8.5 mg/dL (ref 8.4–10.5)
Chloride: 103 mEq/L (ref 96–112)
Chloride: 103 mEq/L (ref 96–112)
Chloride: 103 mEq/L (ref 96–112)
Creatinine, Ser: 0.8 mg/dL (ref 0.4–1.5)
Creatinine, Ser: 0.82 mg/dL (ref 0.4–1.5)
Creatinine, Ser: 0.89 mg/dL (ref 0.4–1.5)
Glucose, Bld: 107 mg/dL — ABNORMAL HIGH (ref 70–99)
Glucose, Bld: 138 mg/dL — ABNORMAL HIGH (ref 70–99)
Glucose, Bld: 206 mg/dL — ABNORMAL HIGH (ref 70–99)
Potassium: 4.1 mEq/L (ref 3.5–5.1)

## 2010-07-08 LAB — CBC
HCT: 29.5 % — ABNORMAL LOW (ref 39.0–52.0)
HCT: 33.1 % — ABNORMAL LOW (ref 39.0–52.0)
MCHC: 33.8 g/dL (ref 30.0–36.0)
MCHC: 34.6 g/dL (ref 30.0–36.0)
MCHC: 34.9 g/dL (ref 30.0–36.0)
MCV: 95.4 fL (ref 78.0–100.0)
MCV: 95.6 fL (ref 78.0–100.0)
MCV: 95.8 fL (ref 78.0–100.0)
Platelets: 176 10*3/uL (ref 150–400)
Platelets: 199 10*3/uL (ref 150–400)
RBC: 3.52 MIL/uL — ABNORMAL LOW (ref 4.22–5.81)
RDW: 13.1 % (ref 11.5–15.5)
RDW: 13.3 % (ref 11.5–15.5)
RDW: 13.5 % (ref 11.5–15.5)
WBC: 5.1 10*3/uL (ref 4.0–10.5)

## 2010-07-08 LAB — CROSSMATCH

## 2010-07-08 LAB — MRSA PCR SCREENING: MRSA by PCR: NEGATIVE

## 2010-07-08 LAB — PROTIME-INR: Prothrombin Time: 13.9 seconds (ref 11.6–15.2)

## 2010-07-15 ENCOUNTER — Ambulatory Visit (HOSPITAL_COMMUNITY)
Admission: RE | Admit: 2010-07-15 | Discharge: 2010-07-15 | Disposition: A | Discharge status: Home / Self Care | Payer: Medicare Other | Source: Ambulatory Visit | Attending: Physical Medicine & Rehabilitation | Admitting: Physical Medicine & Rehabilitation

## 2010-07-17 ENCOUNTER — Ambulatory Visit (HOSPITAL_COMMUNITY)
Admission: RE | Admit: 2010-07-17 | Discharge: 2010-07-17 | Disposition: A | Discharge status: Home / Self Care | Payer: Medicare Other | Source: Ambulatory Visit | Attending: Physical Therapy | Admitting: Physical Therapy

## 2010-07-22 ENCOUNTER — Ambulatory Visit (HOSPITAL_COMMUNITY): Payer: Medicare Other

## 2010-07-23 ENCOUNTER — Ambulatory Visit (HOSPITAL_COMMUNITY): Payer: Medicare Other

## 2010-07-29 ENCOUNTER — Ambulatory Visit (HOSPITAL_COMMUNITY)
Admission: RE | Admit: 2010-07-29 | Discharge: 2010-07-29 | Disposition: A | Discharge status: Home / Self Care | Payer: Medicare Other | Source: Ambulatory Visit | Attending: Physical Medicine & Rehabilitation | Admitting: Physical Medicine & Rehabilitation

## 2010-07-31 ENCOUNTER — Ambulatory Visit (HOSPITAL_COMMUNITY): Admission: RE | Admit: 2010-07-31 | Payer: Medicare Other | Source: Ambulatory Visit | Admitting: Physical Therapy

## 2010-08-05 ENCOUNTER — Ambulatory Visit (HOSPITAL_COMMUNITY): Payer: Medicare Other | Admitting: Physical Therapy

## 2010-08-07 ENCOUNTER — Ambulatory Visit (HOSPITAL_COMMUNITY)
Admission: RE | Admit: 2010-08-07 | Discharge: 2010-08-07 | Disposition: A | Discharge status: Home / Self Care | Payer: Medicare Other | Source: Ambulatory Visit | Admitting: Physical Therapy

## 2010-08-19 ENCOUNTER — Ambulatory Visit (HOSPITAL_COMMUNITY): Payer: Medicare Other

## 2010-08-21 ENCOUNTER — Ambulatory Visit (HOSPITAL_COMMUNITY): Payer: Medicare Other | Admitting: Physical Therapy

## 2010-09-01 NOTE — Consult Note (Signed)
NEW PATIENT CONSULTATION   [name], [name]  DOB:  January 27, 1946                                       05/20/2009  ZOXWR#:[id number]   The patient is a 65 year old male patient who has been operated on  multiple times in the past by Dr. Liliane Bade but has not been seen by a  surgeon in this group for many years.  He has been followed on the  protocol.  He has remote history of left superficial femoral to  popliteal bypass graft and a superficial femoral endarterectomy done in  1998 with patency remaining in the left leg graft.  He also has had a  right lower extremity bypass graft performed in 2004.  ABIs have  remained about 1.0 bilaterally over the years but his most recent study  on the left has diminished to 0.67.  I ordered the study today and  reviewed this and interpreted it and I am concerned that there may be a  new lesion developing in his left arterial tree causing a decrease in  the ABI on the left side.  His right ABI has remained patent.  He does  not complain of any severe claudication symptoms, rest pain, history of  nonhealing ulcers, infection or gangrene.   He is able to ambulate long distances he states.   CHRONIC STABLE PROBLEMS:  1. Coronary artery disease.  2. Hypertension.  3. Hyperlipidemia.  4. Seizure disorder.  5. 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  6. Diabetes mellitus.  7. GERD.   FAMILY HISTORY:  Positive for coronary artery disease, diabetes and  stroke in his mother and carotid artery disease in a brother.   SOCIAL HISTORY:  He is married, has one child and is disabled.  Has not  smoked since 1995.  Does not use alcohol.   REVIEW OF SYSTEMS:  Negative for chest pain, dyspnea on exertion, no  weight loss, anorexia.  Does have a history of reflux esophagitis and  hiatal hernia, history of stroke causing cognitive problems, seizure  disorder, arthritis, anxiety.  All other systems in review of  systems  are negative.   PHYSICAL EXAMINATION:  Vital signs:  Blood pressure 124/70, heart rate  50, respirations 14, temperature 98.  General:  He is a middle-aged male  who is chronically ill, is alert and oriented x3.  He is well-developed  and well-nourished.  HEENT:  Reveals poor oral dentition.  EOMs intact.  Chest:  Clear to auscultation.  Cardiovascular:  Regular rhythm.  No  murmurs.  Neck:  Is supple, 3+ carotid pulses palpable.  No bruits.  Neurological:  Normal.  Abdomen:  Obese.  No palpable masses.  Right leg  has 3+ femoral, 2+ popliteal pulse.  No distal pulses.  Left leg has 3+  femoral, 2+ popliteal pulse.  No distal pulses.  Both the grafts  perfused.   I reviewed his lower extremity Doppler studies as well as his carotid  duplex exam.  The carotid duplex reveals no evidence of restenosis on  the left and widely patent right ICA.  As noted he does appear to have  diminished left ABI.  We will schedule him for angiography to rule out a  lesion which might be amenable to angioplasty and stenting on the left  side to protect his left femoral-popliteal graft  which has been patent  for 13 years.  We have scheduled that for 05/27/2009 with Dr. Ivar Bury D. Hart Rochester, M.D.  Electronically Signed   JDL/MEDQ  D:  05/20/2009  T:  05/21/2009  Job:  [id number]

## 2010-09-01 NOTE — Procedures (Signed)
DUPLEX DEEP VENOUS EXAM - LOWER EXTREMITY   INDICATION:  Right leg swelling.   HISTORY:  Edema:  Yes.  Trauma/Surgery:  Right leg graft revision.  Pain:  No.  PE:  No.  Previous DVT:  No.  Anticoagulants:  Coumadin.  Other:   DUPLEX EXAM:                CFV   SFV   PopV  PTV    GSV                R  L  R  L  R  L  R   L  R     L  Thrombosis    o  o  o     o     o      Not imaged  Spontaneous   +  +  +     +     +      Not imaged  Phasic        +  +  +     +     +  Augmentation  +  +  +     +     +  Compressible  +  +  +     +     +  Competent     +  +  +     +     +   Legend:  + - yes  o - no  p - partial  D - decreased   IMPRESSION:  All veins imaged appear compressible and free from deep  venous thrombus.  A technically difficult study due to swelling in right  leg.  Unable to adequately image mid superficial femoral vein.    _____________________________  Quita Skye. Hart Rochester, M.D.   CB/MEDQ  D:  12/09/2009  T:  12/09/2009  Job:  [id number]

## 2010-09-01 NOTE — Procedures (Signed)
BYPASS GRAFT EVALUATION   INDICATION:  Follow up left superficial femoral artery to posterior  tibial artery bypass graft.   HISTORY:  Diabetes:  Yes.  Cardiac:  CABG.  Hypertension:  Yes.  Smoking:  Yes.  Previous Surgery:  Re-do of the left superficial femoral artery to  posterior tibial artery, 06/04/2009.   SINGLE LEVEL ARTERIAL EXAM                               RIGHT              LEFT  Brachial:                    139                134  Anterior tibial:             Not detected       132  Posterior tibial:            31                 131  Peroneal:  Ankle/brachial index:        0.22               0.95   PREVIOUS ABI:  Date: 05/07/09  RIGHT:  1.04  LEFT:  0.67   LOWER EXTREMITY BYPASS GRAFT DUPLEX EXAM:   DUPLEX:  Patent left superficial femoral artery to posterior tibial  artery bypass graft with no evidence of focal stenosis.  There is an  incidental finding of an occluded right femoral to popliteal bypass  graft.   IMPRESSION:  1. Normal left ankle brachial index; however, the right ankle brachial      index has dropped.  The right ankle brachial index suggests severe      arterial disease.  2. Patent left superficial femoral artery to posterior tibial artery      bypass graft.  3. Occluded right femoral to popliteal bypass graft.   ___________________________________________  Quita Skye. Hart Rochester, M.D.   CB/MEDQ  D:  10/09/2009  T:  10/09/2009  Job:  [id number]

## 2010-09-01 NOTE — Procedures (Signed)
BYPASS GRAFT EVALUATION   INDICATION:  Followup bilateral lower extremity bypass grafts.   HISTORY:  Diabetes:  Yes.  Cardiac:  CABG.  Hypertension:  Yes.  Smoking:  No.  Previous Surgery:  Bilateral fem-pop bypass grafts 1998 with revision of  right 2004 by Dr. Madilyn Fireman.   SINGLE LEVEL ARTERIAL EXAM                               RIGHT              LEFT  Brachial:                    127                131  Anterior tibial:             133                87  Posterior tibial:            136                88  Peroneal:  Ankle/brachial index:        1.04               0.67   PREVIOUS ABI:  Date:  05/02/2008  RIGHT:  1.05  LEFT:  0.98   LOWER EXTREMITY BYPASS GRAFT DUPLEX EXAM:   DUPLEX:  Patent right femoral-popliteal artery bypass graft with no  evidence of significant stenosis.  Patent left superficial femoral artery to popliteal artery bypass graft  with a significant velocity increase in popliteal artery compared to  graft velocities.  Monophasic Doppler waveforms throughout left superficial femoral artery  and the graft with heavy acoustic shadowing in the left common femoral  artery area which may mask higher velocities.   IMPRESSION:  1. Right ankle brachial index is within normal limits and stable.  2. Left ankle brachial index shows significant decrease from previous      study.  3. Patent right femoral-popliteal artery bypass graft.  4. Patent left superficial femoral artery to popliteal artery bypass      graft with native popliteal stenosis.  5. Monophasic waveforms in left superficial femoral artery and bypass      graft with heavy acoustic shadowing in the left common femoral      artery which may be masking higher velocities.  6. Appointment scheduled to see Dr. Hart Rochester.         ___________________________________________  Janetta Hora. Fields, MD   AS/MEDQ  D:  05/07/2009  T:  05/08/2009  Job:  [id number]

## 2010-09-01 NOTE — Procedures (Signed)
CAROTID DUPLEX EXAM   INDICATION:  Follow up left carotid endarterectomy.   HISTORY:  Diabetes:  yes  Cardiac:  CABG  Hypertension:  yes  Smoking:  no  Previous Surgery:  Left CEA on 04/25/2003. Bilateral femoral popliteal  bypass graft 1998 and revision on right 2004 by Dr. Madilyn Fireman.  CV History:  asymptomatic  Amaurosis Fugax No, Paresthesias No, Hemiparesis No                                       RIGHT             LEFT  Brachial systolic pressure:         127               131  Brachial Doppler waveforms:         WNL               WNL  Vertebral direction of flow:        Not detected      antegrade  DUPLEX VELOCITIES (cm/sec)  CCA peak systolic                   74                83  ECA peak systolic                   128               92  ICA peak systolic                   95                57  ICA end diastolic                   24                17  PLAQUE MORPHOLOGY:                  calcific          mixed  PLAQUE AMOUNT:                      moderate          mild  PLAQUE LOCATION:                    ICA/CCA           CCA   IMPRESSION:  1. Right internal carotid artery velocities are suggestive of a 20-39%      stenosis; however may be underestimated due to acoustic shadowing.  2. Left internal carotid artery shows no evidence of restenosis,      status post carotid endarterectomy.  3. Right vertebral artery flow not detected.  4. Left vertebral artery flow antegrade.  5. No significant changes from previous study.         ___________________________________________  Janetta Hora Fields, MD   AS/MEDQ  D:  05/07/2009  T:  05/08/2009  Job:  [id number]

## 2010-09-01 NOTE — Procedures (Signed)
BYPASS GRAFT EVALUATION   INDICATION:  Followup right bypass graft placement.   HISTORY:  Diabetes:  Yes.  Cardiac:  CABG.  Hypertension:  Yes.  Smoking:  Previous.  Previous Surgery:  Left SFA to popliteal artery bypass graft 07/30/1996.  Revision of right femoral to popliteal artery bypass graft on  10/11/2002.  Both by Dr. Madilyn Fireman.  Right femoral to tib peroneal trunk  bypass graft on 11/19/2009.  Redo of left SFA to posterior tibial artery  bypass graft 06/04/2009 by Dr. Hart Rochester.   SINGLE LEVEL ARTERIAL EXAM                               RIGHT              LEFT  Brachial:                    145                140  Anterior tibial:             195                186  Posterior tibial:            198                193  Peroneal:  Ankle/brachial index:        1.37               1.33   PREVIOUS ABI:  Date:  12/09/2009  RIGHT:  1.10  LEFT:  1.10   LOWER EXTREMITY BYPASS GRAFT DUPLEX EXAM:   DUPLEX:  Patent right femoral to tibial peroneal trunk bypass graft with  biphasic waveforms noted throughout.  Distal thigh had limited visualization of the bypass graft.   IMPRESSION:  1. Elevated ankle brachial indices bilaterally, with biphasic      waveforms.  2. Patent right bypass graft with velocity measurements attached.   ___________________________________________  Quita Skye. Hart Rochester, M.D.   EM/MEDQ  D:  03/10/2010  T:  03/10/2010  Job:  [id number]

## 2010-09-01 NOTE — Assessment & Plan Note (Signed)
OFFICE VISIT   LYNDALL, WINDT  DOB:  1946-03-07                                       12/09/2009  ZOXWR#:[id number]   The patient had redo right common femoral to tibial peroneal trunk  bypass graft with 6 mm Gore-Tex performed by me on August 3 for an  ischemic right leg.  He has done well from a vascular standpoint since  that time with resolution of his rest pain and severe ischemic symptoms.  He has had a moderate amount of swelling in the calf and thigh which has  been worse than it was preoperatively.  He has previously had saphenous  vein removed from the right leg.   On examination today blood pressure 128/69, heart rate 60, respirations  20.  His lower extremity exam reveals 3+ femoral and 2+ popliteal graft  pulse on the right with diffuse 1-2+ edema from the foot to the inguinal  crease.  His right foot is well-perfused.   Today I ordered lower extremity arterial Doppler study as well as a  venous duplex to rule out DVT.  There is no evidence of deep venous  obstruction and his ABI in the right leg is now 1.10 compared to 0.22  preoperatively.   I think he is doing well.  I have encouraged him to elevate the leg as  much as possible both at night and during the day.  He will return in 3  months with a scan of the graft and to see me at that time.  He is on  Coumadin therapy which Dr. Sherril Croon is managing.     Quita Skye Hart Rochester, M.D.  Electronically Signed   JDL/MEDQ  D:  12/09/2009  T:  12/10/2009  Job:  [id number]

## 2010-09-01 NOTE — Assessment & Plan Note (Signed)
OFFICE VISIT   [name], [name]  DOB:  03/09/1946                                       07/01/2009  ZOXWR#:[id number]   The patient returns for a followup having undergone a left popliteal to  posterior tibial bypass by me on February 16 for failing left  superficial femoral to popliteal vein graft which had been placed in the  1990s.  He had stenosis of his proximal superficial femoral artery which  was treated by Dr. Myra Gianotti with PTA and stenting and he had severe  tibial peroneal trunk disease which was treated by me with insertion of  a vein graft from the existing vein graft to the below knee popliteal  artery down to the posterior tibial artery making this now sequential  superficial femoral to popliteal to posterior tibial graft.  He does  have 1+ edema in the leg on examination today and well-healed incisions.  He has a 2-3+ posterior tibial pulse in the left foot.  ABI has improved  from 0.6-0.9 on the left and is stable on the right at 0.74.   I stressed to him the importance of continuing the surveillance with  scanning of the graft on a regular basis and he will return to see [place] in  3 months with a fem-pop protocol.  Today his blood pressure is 119/73,  heart rate 58 and temperature 98.     Quita Skye Hart Rochester, M.D.  Electronically Signed   JDL/MEDQ  D:  07/01/2009  T:  07/02/2009  Job:  [id number]

## 2010-09-01 NOTE — Assessment & Plan Note (Signed)
OFFICE VISIT   [name], [name]  DOB:  05-27-1945                                       03/10/2010  ZOXWR#:[id number]   Patient returns today, having had a redo femoral tibial peroneal trunk  bypass graft performed by me August 23 for rest ischemia.  He has done  well since that time with the exception of some moderate swelling in the  right leg, which is improving with elevation.  He is still taking Tylox  for pain, as he does have a chronic pain problem.   On physical exam today, his blood pressure is 159/73, heart rate 47  respirations 18.  The incisions in the right leg have healed nicely.  He  has 1+ edema throughout.  He has a palpable popliteal graft pulse with a  well-perfused right foot.   Today I ordered lower extremity arterial Dopplers which revealed a  widely patent graft on duplex scan and biphasic flow in the right foot  with an ABI of 1.34.   He was reassured regarding these findings and will be followed on a  regular basis in the vascular lab.  He was given a prescription for  Tylox #30 tablets today with the understanding that we would not  continue to prescribe pain medicine for chronic pain syndrome.     Quita Skye Hart Rochester, M.D.  Electronically Signed   JDL/MEDQ  D:  03/10/2010  T:  03/11/2010  Job:  [id number]

## 2010-09-01 NOTE — Assessment & Plan Note (Signed)
OFFICE VISIT   KILO, ESHELMAN  DOB:  11-Mar-1946                                       11/18/2009  EAVWU#:[id number]   The patient has an occluded right femoral popliteal graft inserted by  Dr. Madilyn Fireman in 2004.  He had an angiogram performed by Dr. Darrick Penna on June  30.  This revealed occlusion of the graft with severe disease in the  right common femoral artery and reconstitution of the tibial peroneal  trunk with two-vessel runoff.  His family has requested another  physician to perform the surgery and since I operated on the patient in  February of this year, they requested me.  The patient does have an  extensive history of cardiac problems including coronary artery bypass  grafting and had evaluation by Dr. Colleen Can. Deborah Chalk one month ago  including a stress test which was negative and he was cleared for redo  femoral popliteal surgery.  He has also had a recurrent stroke in the  past and has been seen by Dr. Sharene Skeans in the past.   CHRONIC MEDICAL PROBLEMS:  1. Coronary artery disease.  2. Hypertension.  3. Hyperlipidemia.  4. Seizure disorder.  5. Stroke.  6. Diabetes.  7. Reflux esophagitis.   SOCIAL HISTORY:  He is married and has 1 child and is disabled. He has  not smoked since 1995.  He does not use alcohol regularly.   REVIEW OF SYSTEMS:  Denies any active chest pain or dyspnea on exertion.  He does have aphasia from his previous stroke.   PHYSICAL EXAMINATION:  Blood pressure 163/76, heart rate 50,  respirations 14, temperature 98.  General:  He is a chronically ill-  appearing male who is in no apparent distress, alert and oriented x3.  HEENT:  EOMs intact.  Lungs:  Clear to auscultation.  No rhonchi or  wheezing.  Cardiovascular:  Regular rhythm. No murmurs.  Lower extremity  exam:  Reveals right leg to have  2+ femoral, absent popliteal and  distal pulse.  Left femoral pulse is difficult to palpate but he does  have a 3+ popliteal and  posterior tibial pulse.  Saphenous vein has been  removed entirely in left leg and to the mid calf in the right leg.   I reviewed the angiogram today and the patient is a candidate for redo  right femoral tibial peroneal trunk grafting with 6 mm Gore-Tex.  Risks  and benefits were  thoroughly discussed with the patient as far as a  possible failure of the graft and ultimate amputation and he would like  proceed.  We will schedule that for tomorrow.     Quita Skye Hart Rochester, M.D.  Electronically Signed   JDL/MEDQ  D:  11/18/2009  T:  11/18/2009  Job:  [id number]

## 2010-09-01 NOTE — Procedures (Signed)
CAROTID DUPLEX EXAM   INDICATION:  Follow-up left carotid endarterectomy.   HISTORY:  Diabetes:  Yes.  Cardiac:  CABG.  Hypertension:  Yes.  Smoking:  No.  Previous Surgery:  Left carotid endarterectomy on 04/25/2003, bilateral  fem-pop bypass grafts.  CV History:  Asymptomatic.  Amaurosis Fugax No, Paresthesias No, Hemiparesis No.                                       RIGHT             LEFT  Brachial systolic pressure:         174               168  Brachial Doppler waveforms:         Normal            Normal  Vertebral direction of flow:        Not visualized    Antegrade  DUPLEX VELOCITIES (cm/sec)  CCA peak systolic                   89                110  ECA peak systolic                   88                116  ICA peak systolic                   88                43  ICA end diastolic                   11                15  PLAQUE MORPHOLOGY:                  Calcific          Mixed  PLAQUE AMOUNT:                      Moderate          Mild  PLAQUE LOCATION:                    ICA/CCA           CCA   IMPRESSION:  1. Doppler velocities suggest a 1-39% stenosis of the right internal      carotid artery, however the percentage of stenosis may be      underestimated due to calcific shadowing.  2. Patent left carotid endarterectomy site with no evidence of      stenosis.  3. The right vertebral artery flow was not adequately visualized.  4. No significant change noted when compared to the previous exam on      05/04/2007.   ___________________________________________  P. Liliane Bade, M.D.   CH/MEDQ  D:  05/02/2008  T:  05/02/2008  Job:  [id number]

## 2010-09-01 NOTE — Procedures (Signed)
BYPASS GRAFT EVALUATION   INDICATION:  Follow-up bilateral lower extremity bypass grafts.   HISTORY:  Diabetes:  Yes.  Cardiac:  CABG.  Hypertension:  Yes.  Smoking:  No.  Previous Surgery:  Left superficial femoral-to-popliteal artery bypass  graft on 07/30/1996, right fem-pop bypass graft revision on 10/11/2002.   SINGLE LEVEL ARTERIAL EXAM                               RIGHT              LEFT  Brachial:                    174                168  Anterior tibial:             168                160  Posterior tibial:            182                170  Peroneal:  Ankle/brachial index:        1.05               0.98   PREVIOUS ABI:  Date: 09/25/2007  RIGHT:  1.08  LEFT:  1.07   LOWER EXTREMITY BYPASS GRAFT DUPLEX EXAM:   DUPLEX:  Biphasic Doppler waveforms noted throughout the bilateral lower  extremity bypass grafts and native vessels with an increased velocity of  208 cm/s noted in the left popliteal artery.   IMPRESSION:  1. Patent bilateral fem-pop bypass grafts with increased velocity of      the left popliteal artery noted, as described above.  2. Stable bilateral ankle brachial indices noted.  3. No significant change when compared to the previous exam on      05/04/2007.   ___________________________________________  P. Liliane Bade, M.D.   CH/MEDQ  D:  05/02/2008  T:  05/02/2008  Job:  [id number]

## 2010-09-01 NOTE — Procedures (Signed)
BYPASS GRAFT EVALUATION   INDICATION:  Followup, bilateral bypass grafts.   HISTORY:  Diabetes:  Yes, on oral medication.  Cardiac:  CABG in February, 2008 by Dr. Laneta Simmers.  Hypertension:  Yes.  Smoking:  No.  Previous Surgery:  Left superficial femoral to popliteal artery bypass  graft with saphenous vein on 07/30/96, revision of right femoral to  popliteal artery bypass graft with Gore-Tex interposition graft and  reverse saphenous vein on 10/11/2002, both by Dr. Madilyn Fireman.   SINGLE LEVEL ARTERIAL EXAM                               RIGHT              LEFT  Brachial:                    140                144  Anterior tibial:             144                134  Posterior tibial:            150                140  Peroneal:  Ankle/brachial index:        >1.0               >1.0   PREVIOUS ABI:  Date: 06/06/2006  RIGHT:  1.0  LEFT:  >1.0   LOWER EXTREMITY BYPASS GRAFT DUPLEX EXAM:   DUPLEX:  Doppler arterial waveforms are biphasic proximal to,  throughout, and distal to the grafts bilaterally.  Elevated velocities  in the native artery on the left distal to the graft are stable from  previous exam.   IMPRESSION:  1. Patent bilateral lower extremity grafts.  2. Ankle brachial indices are stable bilaterally.   ___________________________________________  P. Liliane Bade, M.D.   DP/MEDQ  D:  05/04/2007  T:  05/04/2007  Job:  [id number]

## 2010-09-01 NOTE — Procedures (Signed)
CAROTID DUPLEX EXAM   INDICATION:  Follow up carotid artery disease.   HISTORY:  Diabetes:  Yes, on oral medication.  Cardiac:  CABG in February, 2008 by Dr. Laneta Simmers.  Hypertension:  Yes.  Smoking:  No.  Previous Surgery:  Left carotid endarterectomy with DPA on April 25, 2003 by Dr. Madilyn Fireman.  CV History:  Patient states that he had a CVA prior to his surgery in  2005.  Amaurosis Fugax No, Paresthesias No, Hemiparesis No                                       RIGHT             LEFT  Brachial systolic pressure:         140               144  Brachial Doppler waveforms:         Biphasic          Biphasic  Vertebral direction of flow:        Not identified    Antegrade  DUPLEX VELOCITIES (cm/sec)  CCA peak systolic                   72                67  ECA peak systolic                   68                46  ICA peak systolic                   60                72  ICA end diastolic                   10                13  PLAQUE MORPHOLOGY:                  Calcified with shadowing            Intimal thickening  PLAQUE AMOUNT:                      Moderate          Mild  PLAQUE LOCATION:                    ICA               Proximal ICA   IMPRESSION:  1. 20-39% right internal carotid artery stenosis; however, calcified      plaque with shadowing could obscure a more severe stenosis.  2. No left internal carotid artery stenosis, status post      endarterectomy.  3. Right vertebral artery not identified, possible occlusion.  4. Study essentially unchanged from January, 2008.   ___________________________________________  P. Liliane Bade, M.D.   DP/MEDQ  D:  05/04/2007  T:  05/04/2007  Job:  [id number]

## 2010-09-01 NOTE — Assessment & Plan Note (Signed)
OFFICE VISIT   [name], [name]  DOB:  Mar 31, 1946                                       10/08/2009  NFAOZ#:[id number]   CHIEF COMPLAINT:  Right foot pain.   The patient is a 65 year old male who has previously undergone a right  femoral to below knee popliteal bypass by Dr. Madilyn Fireman in December 2002.  He subsequently underwent revision with replacement of the proximal  aspect with PTFE in June 2004.  His wife states that he has been having  pain with numbness in the right foot for approximately 2 weeks.  He was  seen in our noninvasive vascular lab today and found have an occluded  fem-pop bypass on the right side.   Of note, he recently had some left superficial femoral artery stenting  followed by revision of his left fem-pop bypass to posterior tibial  artery by Dr. Hart Rochester in February 2011.  During that hospital stay he was  felt to also have had a recurrent stroke and was seen by Dr. Sharene Skeans  during that hospital admission.  He is followed chronically by Dr.  Deborah Chalk or his cardiology issues.  Chronic medical problems include  coronary artery disease, hypertension, hyperlipidemia, seizure disorder,  stroke, diabetes and reflux.  These are all currently stable.  His  neurologic problems have been followed by Dr. Sharene Skeans most recently.   He denies any claudication symptoms. He actually has to walk with a cane  currently and he is ambulatory but only minimally.  He also has some  cognitive deficits from his previous stroke.  His wife gave most of the  information during the interview today.   FAMILY HISTORY:  Remarkable for coronary artery disease and diabetes.   SOCIAL HISTORY:  He is married, has 1 child and is disabled.  He has not  smoked since 1995.  He does not consume alcohol regularly.   REVIEW OF SYSTEMS:  A full 12 point review of systems was reviewed from  February 2011 in his office visit with Dr. Hart Rochester and this is unchanged.   PHYSICAL  EXAMINATION:  Blood pressure is 126/80 in the right arm, pulse  is 16 and regular.  Temperature is 98, respirations 16. General:  White  male in no acute distress.  HEENT:  Unremarkable.  Neck:  Has 2+ carotid  pulses without bruit.  Chest:  Clear to auscultation but with distant  breath sounds.  He also has distant cardiac sounds but has regular rate  and rhythm.  Abdomen:  Slightly obese, soft, nontender, nondistended.  Extremities:  He had bilateral radial artery harvest sites.  In the  lower extremity he has a 2+ right femoral pulse.  He has an absent left  femoral pulse.  He has a pink, warm, left foot with absent popliteal or  pedal pulses.  The right foot is slightly more dusky but delayed  capillary refill, slightly decreased sensation in the right foot.  There  are no open ulcerations.  Skin:  Has no other ulcers or rashes.  Neurologic exam:  He is slow to answer some questions and becomes  confused easily.  Upper and lower extremities have 5 over 5 motor  strength.   Duplex exam today shows an occluded right fem-pop bypass graft.   MEDICATIONS:  Depakote, Tegretol,  Procardia XL, Lopid, Imdur, Prilosec,  Zocor, Glucotrol, Altace,  Synthroid, Lopressor, Celexa, Xanax and Tylox.  He was given a renewal for his Tylox prescription today for the right  foot pain.   In summary, this patient now has occluded his right leg bypass that has  previously undergone 2 previous operations.  He apparently has fairly  significant overall cardiac risk but apparently was seen by Dr. Deborah Chalk  prior to his last bypass operation.  He has an office visit scheduled  with Dr. Deborah Chalk next week.  We will plan to do an arteriogram and lower  extremity runoff on him on Friday, June 24 with a plan to potentially  redo his right leg bypass on the following Thursday, June the 30th.  This will be pending Dr. Ronnald Nian  evaluation on Wednesday June 29.  Risks, benefits, possible complications and procedure  details of the  arteriogram as well as bypass were explained to the patient and his wife  today.  He understands and agrees to proceed.     Janetta Hora. Fields, MD  Electronically Signed   CEF/MEDQ  D:  10/08/2009  T:  10/09/2009  Job:  [id number]   cc:   Colleen Can. Deborah Chalk, M.D.

## 2010-09-04 NOTE — Consult Note (Signed)
NAMECRISTAN, [name] NO.:  [id number]   MEDICAL RECORD NO.:  [id number]          PATIENT TYPE:  INP   LOCATION:  2907                         FACILITY:  [facility]   PHYSICIAN:  Payton Doughty, M.D.      DATE OF BIRTH:  23-Feb-1946   DATE OF CONSULTATION:  07/09/2004  DATE OF DISCHARGE:                                   CONSULTATION   REFERRING PHYSICIAN:  Colleen Can. Deborah Chalk, M.D.   I was called to see this 65 year old right-handed white gentleman who is  well-known to me with a long history of left chest and arm pain.  He was  admitted for shortness of breath, left chest and arm pain and underwent  coronary angiography, the results of which I do not know.  Dr. Deborah Chalk asked  me to see him.  He has some complaints of some left arm numbness and pain  that is paroxysmal in nature.  There is a medical history of question of  post traumatic stress disorder and hypertension.  He uses chronic narcotics  for his peripheral vascular disease, he has seizure disorder, hyponatremia.  He had several episodes such as this in the past.   PHYSICAL EXAMINATION:  GENERAL APPEARANCE:  He is awake, alert and oriented  x3.  HEENT:  Pupils are equal, round and reactive to light.  Extraocular  movements are intact.  Facial movement and sensation are intact.  Tongue is  in the midline. Shoulder shrug is normal.  He describes swallowing  difficulties.  MOTOR:  Examination shows 5/5 strength throughout the upper and lower  extremities.  Sensation intact.  Reflexes are 1 at the biceps and triceps, 1  at the knees, 1 at the ankles, toes downgoing.  Hoffman's is negative.  MR  was apparently tried to be obtained but because of his claustrophobia, he  could not do it.   CLINICAL IMPRESSION:  Left arm pain without neurologic deficit __________  for over 10 years and he has been evaluated for episodes such as this on  numerous occasions in the past.  He probably will require some sedation for  his  MR.  I will review it as it becomes available.      MWR/MEDQ  D:  07/09/2004  T:  07/09/2004  Job:  [id number]

## 2010-09-04 NOTE — Procedures (Signed)
EEG [id number].  [age]  The patient is a right handed Caucasian gentleman  with an excessive history of peripheral vascular disease, coronary artery  disease and a seizure disorder of unknown origin.   REFERRING PHYSICIAN:  Dr. Doreen Beam.   CONSULTING PHYSICIAN:  Melvyn Novas, M.D.   TYPE OF ELECTROENCEPHALOGRAM RECORDING:  Portable ICU study.   STATE OF PATIENT:  Awake and asleep, right handed.   ACTIVATION PROCEDURES:  None.   MEDICATIONS:  Depakote, carbamazepine, Isosorbide, dinitrate, nifedipine,  Guaifenesin, Protonix, Lipitor, Glucotrol, Altace, Synthroid, Metoprolol,  Tylox and Phenergan.   TECHNICAL DATA:  This is a 17-channel EEG recording with one EEG channel  representing heart rate and rhythm.  The 10-20 placement system of  electrodes was chosen to monitor the study.  This is a portable study.   The posterior dominant background was difficult to establish and at the same  time there is excessive beta fast activity in the anterior leads indicating  __________ medication related changes.  There is focal additional slowing.  The background can be estimated at 7 hertz bilaterally fairly equal.  The  focal slowing occurs over the left temporal region with a maximum at T5P3  and lacks correlation to it's right sided counterparts.  There is no  epileptiform discharge noted.  There is drowsiness with theta range  frequencies noted throughout the study, but the patient never proceeded into  full sleep pattern with delta range frequencies.  There are no vertex waves  or __________ seen.   This is an abnormal electroencephalogram due to:  1. Generalized slowing with a background of only 7 hertz frequency.  2. Focal slowing to 3 and 4 hertz frequencies especially over the left     temporal region T3/5 and excessive movement artifact as well as beta fast     activity in the anterior leads, indicating noncompliance.   Clinical correlation is recommended.  The patient was  transferred from an  outside hospital with a question of right sided acute MCA stroke by CT.    Melvyn Novas, M.D.   ZO:XWRU  D:  29/03/2003 20:27:20  T:  29/03/2003 04:54:09  Job #:  [id number]

## 2010-09-04 NOTE — Op Note (Signed)
NAME:  [name]                           ACCOUNT NO.:  [id number]   MEDICAL RECORD NO.:  [id number]                   PATIENT TYPE:  INP   LOCATION:  2876                                 FACILITY:  [facility]   PHYSICIAN:  Balinda Quails, M.D.                 DATE OF BIRTH:  1945/09/27   DATE OF PROCEDURE:  09/19/2002  DATE OF DISCHARGE:                                 OPERATIVE REPORT   PREOPERATIVE DIAGNOSIS:  Poorly functioning right superficial femoral  popliteal bypass graft.   POSTOPERATIVE DIAGNOSIS:  Poorly functioning right superficial femoral  popliteal bypass graft.   OPERATION PERFORMED:  Revision of right superficial femoral popliteal bypass  graft with 6 mm Gore-Tex interposition.   SURGEON:  Balinda Quails, M.D.   ASSISTANT:  Eber Hong, P.A.   ANESTHESIA:  General endotracheal.   ANESTHESIOLOGIST:  Judie Petit, M.D.   INDICATIONS FOR PROCEDURE:  [name] is a 65 year old male with a history of  advanced peripheral vascular disease.  He has previously undergone bilateral  superficial femoral to popliteal bypass vein grafts.  He developed a drop in  the pressure in his right leg and with recurrent claudication symptoms.  He  underwent arteriography in the right superficial femoral artery proximal to  the take off of the bypass graft.  This is severely stenotic and subtotally  occluded.  He is brought to the operating room at this time for revision.   DESCRIPTION OF PROCEDURE:  The patient was brought to the operating room in  stable condition.  Placed in supine position.  General endotracheal  anesthesia induced.  The right leg prepped and draped in sterile fashion.   A longitudinal skin incision made through the scar in the right groin.  Dissection carried down through the subcutaneous tissue.  Lymphatics divided  with electrocautery.  Deep dissection carried down to expose the common  femoral artery at the inguinal ligament.  This was layered  with plaque. The  common femoral artery mobilized and encircled with a vessel loop.  Distal  dissection carried down to the margin of the profundus and superficial  femoral arteries.  The profunda femoris artery encircled with a vessel loop.  The origin of the superficial femoral artery severely diseased and encircled  with a vessel loop.  There was a soft area in the right lateral anterior  aspect of the right common femoral artery for take off of the graft.  A  second skin incision then made in the midthigh through the scar.  Dissection  carried down through the subcutaneous tissue.  The sartorius muscle  mobilized.  The proximal vein graft was identified right at the anastomosis  to the superficial femoral artery.  This was mobilized and encircled with a  vessel loop.   The patient was administered 7000 units of heparin intravenously. Adequate  circulation time permitted.  The right femoral vessels  were controlled with  clamps.  A longitudinal arteriotomy made in the right common femoral artery.  A 6 mm  Gore-Tex graft was anastomosed end-to-side to the right common  femoral artery using running 6-0 Prolene suture.  The femoral vessels then  flushed and the graft controlled with a fistula clamp.  This was then  brought down through a subsartorial tunnel along the right superficial  femoral artery to the exposed vein graft.  The vein graft controlled  proximally and distally with Seraphim clamps. A longitudinal venotomy made  in the vein graft.  The Gore-Tex graft beveled and anastomosis end-to-side  to the vein graft using running 6-0 Prolene suture.  Adequate flushing  carried out.  Clamps were then removed.  Excellent flow present.  Adequate  hemostasis obtained.  Sponge and instrument counts were correct.   Patient administered 50 mg of protamine intravenously.  The wound was  irrigated with antibiotic solution.  Both incisions were closed with a deep  layer of running 2-0 Vicryl  suture and subcutaneous layer of running 2-0  Vicryl suture.  Staples applied to the skin.  Sterile dressings applied.  The patient tolerated the procedure well.  Transferred to recovery room in  stable condition.                                               Balinda Quails, M.D.    PGH/MEDQ  D:  09/19/2002  T:  09/19/2002  Job:  [id number]

## 2010-09-04 NOTE — Procedures (Signed)
[facility]  Patient:    [name], [name] Visit Number: [id number] MRN: [id number]          Service Type: DSU Location: [facility] 2899 [id number] Attending Physician:  Melvenia Needles Dictated by:   Denman George, M.D. Proc. Date: 02/24/01 Admit Date:  02/24/2001 Discharge Date: 02/24/2001   CC:         Colleen Can. Deborah Chalk, M.D.             Peripheral Vascular Catheterization Lab                           Procedure Report  DIAGNOSIS: Recurrent left lower extremity claudication.  PROCEDURES: 1. Aortogram with bilateral lower extremity runoff arteriography. 2. Selective right lower extremity arteriogram.  CONTRAST: Visipaque 160 mL (severe generalized debilitation).  ACCESS: Right common femoral artery 6 French sheath.  CLINICAL NOTE: This is a 65 year old male, with a history of coronary artery disease and peripheral vascular disease. He has underlying diabetes. On two occasions he has previously undergone angioplasty of right superficial femoral artery lesion for severe limiting claudication. He has also previously undergone a left superficial femoral popliteal bypass graft.  He represented to the office with recurrent claudication symptoms in his right lower extremity. He last underwent angioplasty of the right superficial femoral artery one year ago. He is brought to the catheterization lab at this time for diagnostic arteriography and possible re-do intervention.  DESCRIPTION OF PROCEDURE: The patient was brought to the catheterization laboratory in stable condition. Informed consent was obtained. Both groins were prepped and draped in a sterile fashion.  Skin and subcutaneous tissue in the right groin was instilled with 1% Xylocaine. The patient was administered 2 mg of Versed, 4 mg of Nubain intravenously. During the procedure, he received 10 mg of labetalol intravenously for hypertension.  A needle was easily introduced to the left  common femoral artery. A 0.035 Rosen guide wire passed through the needle into the midabdominal aorta. Initial attempt was made to pass a 5 [place] short sheath; however, this was unsuccessful. A 6 French dilator was then advanced over the guide wire. Following this, a short 6 French sheath was advanced over the guide wire. However, the guide wire became dislodged. The sheath did not flush well. Attempt was made to reinsert the guide wire. A small injection of contrast was made through the sheath revealed the sheath to be outside of the artery. The sheath was then removed and pressure placed on the left groin. The patients blood pressure remained stable.  Further attempt was then made in the left groin with the left common femoral accessed with a needle. The Rosen guide wire was advanced through the needle into the mid abdominal aorta. A long 6 French sheath was then advanced over the guide wire into the left external iliac artery. The dilator was removed and the sheath was flushed with heparin saline solution. Position verified in the left external iliac artery.  A pigtail catheter was then advanced over the guide wire to the midabdominal aorta.  Standard AP midabdominal aortogram obtained. This revealed single bilateral renal arteries which were widely patent. The infrarenal aorta revealed mild atherosclerotic irregularity without significant stenosis. The common iliac artery bilaterally also revealed atherosclerotic irregularity without dominant stenosis. There was a large exophytic plaque at the origin of the left internal iliac artery. The right internal iliac artery was patent.  The pigtail catheter was then brought down  to the aortic bifurcation. Standard AP pelvic arteriogram obtained. This revealed the external iliac arteries bilaterally to be widely patent to the common femoral level. Lower extremity runoff arteriography was then obtained.  The left lower extremity revealed  the left common femoral artery to be widely patent.  There was a severe stenosis at the origin of the left profunda femoris artery. The proximal left superficial femoral artery was patent down to the adductor canal. There was occlusion of the left superficial femoral artery at the adductor canal.  There was patent left superficial femoral to popliteal vein graft bypass without evidence of stenosis in the graft or at the proximal or distal anastomoses. left lower extremity runoff revealed patent distal popliteal artery and three-vessel tibial artery runoff in the left calf.  Right lower extremity revealed the right external iliac artery to be patent. The right common femoral artery was normal. The right profunda femoris artery revealed mild stenosis at its origin. A proximal right superficial femoral artery revealed irregularity but there was no dominant stenosis. The right superficial femoral artery was then patent in the proximal and midcalf and then occluded at the adductor canal. The distal superficial femoral artery and proximal right popliteal artery were occluded. There was reconstitution of the popliteal artery at the knee joint and there was a exophytic plaque in the mid reconstituted midportion of the right popliteal artery.  The distal right popliteal artery was patent with intact three-vessel tibial runoff in the right lower extremity.  The pigtail catheter was then brought down to the aortic bifurcation. The Rosen guide wire advanced across the bifurcation into the right common and external iliac arteries. Exchange carried out for an end-hole catheter which was advanced into the right external iliac artery. Peak hole right lower extremity arteriogram was obtained at the knee joint. This revealed in detail complex plaque which was occluding the right superficial femoral popliteal junction. This appeared to be heavily calcified and exophytic.  Further peak hole view of the  tibial vessels revealed intact tibial runoff  with patent three-vessel tibial arteries in the right calf.  This completed the arteriogram procedure. The guide wire was reinserted and the end-hole catheter and guide wire removed. The patient was transferred to the holding area where the left femoral sheath was removed.  FINAL IMPRESSION: 1. Widely left superficial femoral popliteal vein graft bypass. 2. Recurrent occlusion of the right superficial femoral popliteal junction    with complex exophytic calcified plaque.  DISPOSITION: These results have been discussed with the patient and family. Further treatment will require bypass in the right lower extremity for revascularization. This will be reviewed with Dr. Deborah Chalk prior to scheduled surgery. Dictated by:   Denman George, M.D. Attending Physician:  Melvenia Needles DD:  02/24/01 TD:  02/25/01 Job: [id number]

## 2010-09-04 NOTE — Op Note (Signed)
NAME:  [name], [name]                           ACCOUNT NO.:  [id number]   MEDICAL RECORD NO.:  [id number]                   PATIENT TYPE:  INP   LOCATION:  2005                                 FACILITY:  [facility]   PHYSICIAN:  Balinda Quails, M.D.                 DATE OF BIRTH:  1945/07/02   DATE OF PROCEDURE:  04/25/2003  DATE OF DISCHARGE:  04/27/2003                                 OPERATIVE REPORT   SURGEON:  Balinda Quails, M.D.   ASSISTANT:  Coral Ceo, P.A.-C.   ANESTHESIA:  General endotracheal anesthesia.   ANESTHESIOLOGIST:  Bedelia Person, M.D.   PREOPERATIVE DIAGNOSIS:  Severe left internal carotid artery stenosis.   POSTOPERATIVE DIAGNOSIS:  Severe left internal carotid artery stenosis.   PROCEDURE:  Left carotid endarterectomy with Dacron patch angioplasty.   CLINICAL NOTE:  [name] is a 65 year old patient with diffuse  atherosclerotic vascular disease.  He was admitted to [facility] on  April 17, 2003, with a fluent aphasia.  Workup for this revealed evidence  of severe left internal carotid artery stenosis.  This was felt to be a  symptomatic lesion.  He was referred for evaluation and management of his  left carotid disease.  After a complete workup including preoperative  cardiac clearance, he is brought to the operating room at this time for left  carotid endarterectomy for reduction of stroke risk.  The risks of this  operative procedure with the major morbidity and mortality of 1-2% to  include but not limited to MI, CVA, cranial nerve injury, and death were  discussed with the patient.   OPERATIVE PROCEDURE:  The patient was brought to the operating room in  stable condition.  He was placed under general endotracheal anesthesia.  A  Foley catheter and arterial line were placed.  The left neck was prepped and  draped in a sterile fashion.  A curvilinear skin incision was made along the  anterior border of the left sternocleidomastoid muscle.   The incision was  extended deeply through the subcutaneous tissue.  The platysma was divided  with electrocautery.  Deep dissection was carried down along the  sternomastoid muscle to expose the the carotid bifurcation.  The facial vein  was ligated with 2-0 silk and divided.  The carotid bifurcation was exposed.  The common carotid artery was mobilized down to the omohyoid muscle and  encircled with a vessel loop.  The vagus nerve was reflected posteriorly and  preserved.  The origin of the superior thyroid and external carotid were  encircled with vessel loops.  The internal carotid artery was followed  distally up to the posterior belly of the digastric muscle.  There was a  large amount of plaque at the carotid bifurcation extending into the  internal carotid artery.  The distal internal carotid artery was soft and  free of plaque.   The  patient was administered 7000 units of heparin intravenously.  Adequate  circulation time was permitted.  The carotid vessels were controlled with  clamps.  A longitudinal arteriotomy was made in the distal common carotid  artery.  The arteriotomy extended across the carotid bulb and up into the  internal carotid artery.  There was a large amount of plaque at the carotid  bifurcation with a high grade left internal carotid artery stenosis.  A  shunt was inserted.  An endarterectomy was was carried down into the common  carotid artery where the plaque was divided transversely with Potts  scissors.  The plaque was then raised up into the bulb where the superior  thyroid and external carotid were endarterectomized using the eversion  technique.  The internal carotid plaque then feathered out distally.  Fragments of plaque were removed with plaque forceps.  The site was  irrigated with heparin saline solution.  A Finesse Dacron patch was placed  over the endarterectomy site with running 6-0 Prolene suture.  At the  completion of the patch angioplasty, the  shunt was removed.  All vessels  were well flushed.  Antegrade flow was introduced into the external carotid  artery, following this the internal carotid was released.  Adequate  hemostasis was obtained.  Sponge and instrument counts were correct.  The  patient was administered 50 mg protamine intravenously.  The  sternocleidomastoid fascia was closed with running 2-0 Vicryl suture.  The  platysma was closed with running 3-0 Vicryl suture.  The skin was closed  with 4-0 Monocryl.  Steri-Strips were applied.  The patient tolerated the  procedure well.  He was transferred to the recovery room in stable  condition.                                               Balinda Quails, M.D.    PGH/MEDQ  D:  08/21/2003  T:  08/21/2003  Job:  [id number]   cc:   Colleen Can. Deborah Chalk, M.D.  Fax: [phone]

## 2010-09-04 NOTE — Consult Note (Signed)
[name], [name]                 ACCOUNT NO.:  [id number]   MEDICAL RECORD NO.:  [id number]          PATIENT TYPE:  INP   LOCATION:  2012                         FACILITY:  [facility]   PHYSICIAN:  Evelene Croon, M.D.     DATE OF BIRTH:  April 24, 1945   DATE OF CONSULTATION:  06/06/2006  DATE OF DISCHARGE:                                 CONSULTATION   CARDIOVASCULAR SURGICAL CONSULTATION:   REASON FOR CONSULTATION:  Severe three-vessel coronary artery disease  with unstable angina.   CLINICAL HISTORY:  This patient is a 65 year old gentleman with diabetes  and diffuse vascular disease who has a history of coronary artery  disease in the past treated medically.  He presented with a 2-3 day  history of recurrent episodes of substernal chest pain requiring  sublingual nitroglycerin.  He went to [facility] but left against  medical advice.  He had recurrent chest pain home, and his wife brought  him to [facility] to see Dr. Deborah Chalk.  His initial enzymes were  negative.  Electrocardiogram showed inferolateral EKG changes.  He  underwent cardiac catheterization today which showed severe three-vessel  coronary disease with diffusely diseased coronary arteries.  There is  extensive coronary calcification.  The LAD had proximal irregularities  was 50-60% stenosis.  There is a 90% distal stenosis before the apex.  The first diagonal had about 80% ostial stenosis and was a moderate  sized vessel.  Left circumflex was occluded e proximally.  There was  TIMI II flow into the distal vessel.  The right coronary had 90% mid  vessel stenosis.  There was 99% stenosis of a small acute marginal  branch.  There was about 30-50% stenosis at the crux.  Left ventricular  ejection fraction of about 40% with apical akinesis.  There was no  significant mitral regurgitation.   REVIEW OF SYSTEMS:  His review of systems was obtained from his wife.  GENERAL:  No fever or chills.  No recent weight changes.   Appetite has  been good.  He has had fatigue.  EYES:  Negative.  ENT:  Negative.  ENDOCRINE:  He has adult onset diabetes and hypothyroidism.  CARDIOVASCULAR:  He has had recent recurrent episodes of substernal  chest pain.  He has had associated shortness of breath.  He denies any  palpitations.  She had no PND or orthopnea.  RESPIRATORY:  No cough or  sputum production.  GI:  No nausea or vomiting.  No melena or bright red  blood per rectum.  GU:  No dysuria or hematuria.  NEUROLOGICAL:  No  focal weakness or numbness.  No dizziness or syncope.  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  ALLERGIES:  MORPHINE SULFATE.  NEUROLOGICAL:  He does have a history of seizure  disorder.  PSYCHIATRIC:  Negative.  VASCULAR:  He has had a long history  of peripheral vascular disease as noted in his past medical history.   PAST MEDICAL HISTORY:  1. Significant for coronary disease as mentioned above.  His last  catheterization prior to this admission was in 2006, at which time      he had moderate disease and was treated medically.  2. He has a history of cerebrovascular disease status post stroke in      2004 or 2005.  He underwent left carotid endarterectomy by Dr.      Madilyn Fireman for high-grade left internal carotid artery stenosis in 2005.  3. He has a history of peripheral vascular disease status post a left      femoral popliteal bypass with saphenous vein around 1998 and      subsequent right femoral-popliteal bypass of the saphenous vein in      2002.  He underwent revision of the right femoral popliteal bypass      with Gore-Tex interposition graft in June of 2004.  4. History of diabetes.  5. Hypertension.  6. History of gastroesophageal reflux disease.  7. History of seizure disorder.  8. He is status post colon resection for colon cancer last August      performed at [facility].  He did not require chemotherapy or      any other adjuvant  therapy.  9. He is status post cholecystectomy in the 1980s.  10.He is status post left shoulder surgery.  11.Status post tonsillectomy and adenoidectomy.   MEDICATIONS:  As noted on his home medication reconciliation orders.   SOCIAL HISTORY:  He is disabled and lives with his wife.  He quit  smoking about 16 years ago.  Denies alcohol abuse.   FAMILY HISTORY:  Negative.   PHYSICAL EXAMINATION:  VITAL SIGNS:  Blood pressure is 148/80.  Pulse is  65 and regular.  Respiratory rate is 18.  GENERAL:  He is currently very sedated from his the cardiac  catheterization.  HEENT:  Normocephalic and atraumatic.  I was not able to examine his  pupils or extraocular muscles.  NECK:  Normal carotid pulses bilaterally.  There are no bruits.  There  is a left cervical scar from his previous carotid endarterectomy.  There  is no adenopathy or thyromegaly.  CARDIAC:  Regular rate and rhythm with normal S1-S2.  There is no  murmur, rub or gallop.  LUNGS:  Clear.  ABDOMEN:  Active bowel sounds.  His abdomen is soft and nontender.  There are no palpable masses or organomegaly.  There is an old right  upper quadrant scar from his cholecystectomy and a transverse right  lower quadrant scar from his colon resection.  EXTREMITIES:  Multiple bilateral leg incisions from previous lower  extremity revascularization surgery.  His femoral pulses are palpable  bilaterally.  Left dorsalis pedis pulses is strongly palpable.  Left  posterior tibial pulses not palpable.  Right pedal pulses are not  palpable.  SKIN:  Warm and dry.  NEUROLOGIC:  I was not able to perform.   CHEST X-RAY:  Cardiomegaly with mild vascular congestion, stable  peribronchial thickening and basilar atelectasis or scarring.   ELECTROCARDIOGRAM:  Normal sinus rhythm with inferolateral T-wave  changes.   LABORATORY DATA:  His CPK enzymes were negative.  Troponin was 0.06. Electrolytes were normal with a BUN of 12, creatinine of 0.7.   White  blood cell count 4.5, hemoglobin 9.9, platelet count 221,000.  Coagulation profile is within normal limits.   IMPRESSION:  Mr. Frankl has severe diffuse three-vessel coronary disease  with heavily calcified coronary arteries.  He has mild left ventricular  dysfunction.  He presented with unstable anginal symptoms.  I agree that  coronary artery bypass graft surgery is the best treatment for his  coronary disease.  Unfortunately, he has had bilateral lower extremity  revascularization using his saphenous veins, and there is no conduit in  his legs.  We can use bilateral internal mammary grafts although that  would not provide complete revascularization and would increase his risk  of sternal nonunion and wound healing problems including mediastinitis,  given diabetes and obesity are present.  We will have the vascular lab  do upper extremity vascular exams to decide whether his radial arteries  could be used.  I think his operative risk would be significantly  increased, given all of his underlying problems and diffuse vascular  disease.  I discussed all the above with the patient's wife.  He is very  sedated after his procedure and did not wake up at all while we were  talking of examining the patient.  We will await his upper extremity  vascular exam, and then make further decisions depending on that.      Evelene Croon, M.D.  Electronically Signed     BB/MEDQ  D:  06/06/2006  T:  06/07/2006  Job:  [id number]   cc:   Colleen Can. Deborah Chalk, M.D.

## 2010-09-04 NOTE — Op Note (Signed)
[facility]  Patient:    [name], [name] Visit Number: [id number] MRN: [id number]          Service Type: SUR Location: [id number] Attending Physician:  Melvenia Needles Dictated by:   Denman George, M.D. Proc. Date: 03/28/01 Admit Date:  03/28/2001   CC:         Colleen Can. Deborah Chalk, M.D.   Operative Report  SURGEON:  Denman George, M.D.  ASSISTANT:  Brayton Layman, M.D. and Liane Comber, P.A.  ANESTHESIA:  General endotracheal anesthesia.  ANESTHESIOLOGIST:  Cliffton Asters. Ivin Booty, M.D.  PREOPERATIVE DIAGNOSIS:  Ischemic rest pain right foot.  POSTOPERATIVE DIAGNOSIS:  Ischemic rest pain right foot.  PROCEDURE:  Right superficial femoral popliteal bypass with reversed saphenuos vein graft.  INDICATIONS:  This is a 65 year old male with diffuse peripheral vascular disease and coronary vascular disease.  He has previously undergone a left superficial femoral popliteal bypass approximately four years ago.  He has had two previous angioplasties of the right superficial femoral artery.  He recently presented with increasing claudication symptoms and rest pain at night.  He underwent repeat arteriography and this reveals an occluded right superficial femoral artery.  He is brought to the operating room at this time for revascularization of right lower extremity.  DESCRIPTION OF PROCEDURE:  The patient was brought to the operating room in stable condition.  He was placed under general endotracheal anesthesia.  Right leg prepped and draped in the usual sterile fashion.  A longitudinal skin incision was made in the right groin.  Dissection was carried down to expose the right saphenofemoral junction.  Tributaries of the saphenous vein ligated with 3-0 silk and divided.  Separated longitudinal skin incisions were then made throughout the course of the right thigh down to the right midcalf.  Saphenous vein exposed through these incisions.   Tributaries ligated with 3-0 silk and divided.  Saphenous vein then harvested ligating it proximally and distally with 2-0 silk.  The vein reversed, cannulated, and dilated with heparin saline solution.  Good size and quality throughout the length.  Through the midthigh level incision, the sartorius muscle was reflected anteriorly.  The superficial femoral artery was exposed and encircled proximally and distally with vessel loops.  Through the medial popliteal incision, the popliteal artery exposed.  The artery freed from the vein, encircled proximally and distally with vessel loops.  A subsartorial tunnel was then created between the two incisions.  The patient administered 5000 units of heparin intravenously.  The superficial femoral artery was then controlled proximally and distally with clamps.  Longitudinal arteriotomy made.  The reversed vein anastomosed end-to-side to the superficial femoral artery with running 6-0 Prolene suture. At completion of the proximal anastomosis the artery was flushed.  Clamps were removed from the artery and the vein revealed excellent flow controlled with Serephine clamp.  The vein then tunneled through the subsartorial tunnel to the popliteal artery.  The popliteal artery controlled proximally and distally with fine clamps. Longitudinal arteriotomy made.  The vein trimmed an appropriate length and anastomosed end-to-side to the popliteal artery with running 6-0 Prolene suture.  The artery and vein then flushed.  Clamps were removed.  Excellent flow present.  Adequate hemostasis obtained.  Sponge, needle, and instrument counts were correct.  Subcutaneous tissue closed in the harvest incisions in two layers of running 2-0 Vicryl suture and staples applied to the skin.  The popliteal incision closed with a deep layer of interrupted 2-0 Vicryl  suture for the fascia.  Subcutaneous layer closed with running 2-0 Vicryl suture.  Staples applied to the  skin.  A sterile dressing applied.  The patient tolerated the procedure well and transferred to the recovery room in stable condition. Dictated by:   Denman George, M.D. Attending Physician:  Melvenia Needles DD:  03/28/01 TD:  03/28/01 Job: [id number]

## 2010-09-04 NOTE — Discharge Summary (Signed)
[name], [name] NO.:  [id number]   MEDICAL RECORD NO.:  [id number]          PATIENT TYPE:  INP   LOCATION:  2021                         FACILITY:  [facility]   PHYSICIAN:  Sheliah Plane, MD    DATE OF BIRTH:  1946-03-12   DATE OF ADMISSION:  06/27/2006  DATE OF DISCHARGE:                               DISCHARGE SUMMARY   PRIMARY DIAGNOSIS:  Left forearm radial artery harvest wound hematoma  and infection.   SECONDARY DIAGNOSES:  1. Coronary artery disease, status post coronary artery bypass      grafting June 10, 2006.  2. History of cerebrovascular disease status post stroke in December      of 2004.  Patient continues to have residual expressive and      cognitive dysphagia.  3. Status post left carotid endarterectomy by Dr. Madilyn Fireman in 2005.  4. History of peripheral vascular occlusive disease, status post left      femoral popliteal bypass with saphenous vein graft in 1998 and      subsequent right femoral popliteal bypass to saphenous vein in      2002.  5. The patient underwent revision of right femoral popliteal bypass      with Gore-Tex interposition graft in June of 2004.  6. Diabetes mellitus type two.  7. Hypertension.  8. Gastroesophageal reflux disease.  9. History of seizures.  10.Status post colon resection for colon cancer in August of 2007.  11.History of cholecystectomy in 1980s.  12.Left shoulder surgery.  13.Status post tonsillectomy and adenoidectomy.   HOSPITAL OPERATIONS/PROCEDURES:  Incision and drainage of left forearm  wound, hematoma.   HISTORY AND PHYSICAL/HOSPITAL COURSE:  [name] is a 65 year old  Caucasian male with a history of prior CVA and status post coronary  artery bypass grafting using bilateral internal mammary arteries and  bilateral radial artery June 10, 2006 by Dr. Laneta Simmers.  The patient's  hospital course was prolonged secondary to rehabilitation issues.  Ultimately, he went home with home health  nursing, physical therapy and  occupational therapy June 17, 2006.  Nurse evaluated the patient's  arm June 23, 2006 saying that left forearm had developed severe redness.  The patient contacted office and he was seen and evaluated by Dr.  Tyrone Sage.  At that time, Keflex was prescribed.  He was told to clean  his incision daily with hydrogen peroxide.  Despite this, the patient  presented back to the office on June 27, 2006 and states that it was  not looking any better.  Dr. Cornelius Moras did a debridement of left arm wound  and felt that the patient needed admission to [facility] on IV  antibiotics.  For details of the patient's past medical history and  physical exam, please see dictated history and physical.   The patient was admitted to [facility] on June 27, 2006 with  left forearm hematoma and infection.  He was started on IV antibiotics  at that time.  The patient had ordered dressing changes b.i.d. with wet  to dry dressings.  Wound had no change and Dr.  Cornelius Moras felt best to take  the patient to the operating room for incision and drainage.  The  patient acknowledged understanding and agreed to proceed.   He was taken to the operating room June 29, 2006 for incision and  drainage of left forearm.  He tolerated this well and was readmitted to  the telemetry floor.  The patient was restarted on his IV antibiotics.  Wound Care was consulted June 30, 2006 for evaluation of wound vac  placement.  At that time, wound care felt best to apply wound vac to the  patient's left forearm and felt that this would be the best form of  treatment to promote granulation.  The patient continued on IV  vancomycin during his hospital course.   Prior to being admitted to [facility], Dr. Cornelius Moras did culture the  site and it grew a few staph aureus organisms.  During the patient's  hospital course, vital signs were monitored and noted to be stable.  The  patient remained afebrile.   He remained off oxygen sating greater than  90%.  Due to the patient's history of diabetes mellitus, blood sugars  were monitored closely and were stable.  He remained in normal sinus  rhythm.  Pulmonary stats remained stable.  Sternal incision continued to  heal as well as right forearm incision.   The patient tentatively is ready for discharge home on July 01, 2006,  postoperative day two with wound vac.  Home health nursing will also be  arranged to change the wound vac Monday, Wednesday and Friday.   FOLLOW UP:  Our office will contact the patient for follow up  appointment with Dr. Laneta Simmers.  The patient is to keep his follow up  appointment with his Cardiology.  Activity:  The patient is instructed  no driving, no heavy greater than 10 pounds.  He is told to ambulate 3-4  times per day as tolerated and continue his breathing exercises.   Incisional care: Home health nurse to change the wound vac on Monday,  Wednesday and Friday.  Contact our office if wound worsens.  He is to  wash incisions using soap and water.   Diet:  The patient is educated on diet to be carbohydrate modified,  medium calorie diet.   DISCHARGE MEDICATIONS:  1. Glipizide 5 mg daily.  2. Procardia XL 90 mg daily.  3. Xanax 1 mg at night.  4. Niferex 150 mg daily.  5. Aspirin 325 mg daily.  6. Lopressor 75 mg b.i.d.  7. Altace 10 mg b.i.d.  8. Lipitor 40 mg at night.  9. Celexa 20 mg daily.  10.Depakote 1000 mg b.i.d.  11.Depakote 500 mg lunch and dinner.  12.Tegretol 200 mg t.i.d. with meals.  13.Tegretol 400 mg at night.  14.Lopid 600 mg b.i.d.  15.Synthroid 175 mcg daily.  16.Prilosec 40 mg daily.  17.Percocet 5/325 1-2 tabs p.o. q4-6 hours p.r.n. pain.  18.Keflex 250 mg 4 times per day times 14 days.   DICTATED BY:  Theda Belfast, PA      Sheliah Plane, MD  Electronically Signed    EG/MEDQ  D:  06/30/2006  T:  07/01/2006  Job:  [id number]   cc:   Salvatore Decent. Cornelius Moras, M.D.

## 2010-09-04 NOTE — Cardiovascular Report (Signed)
NAMEDHANVIN, SZETO NO.:  [id number]   MEDICAL RECORD NO.:  [id number]          PATIENT TYPE:  INP   LOCATION:  2012                         FACILITY:  [facility]   PHYSICIAN:  Colleen Can. Deborah Chalk, M.D.DATE OF BIRTH:  10-31-1945   DATE OF PROCEDURE:  06/06/2006  DATE OF DISCHARGE:                            CARDIAC CATHETERIZATION   PROCEDURE:  Left heart catheterization with selective coronary  angiography and left ventricular angiography.   TYPE AND SITE OF ENTRY:  Percutaneous right femoral artery.   CATHETERS:  A 6-French 4 curved Judkins right and left coronary  catheters, 6-French pigtail ventriculographic catheter.   CONTRAST MATERIAL:  Omnipaque.   MEDICATIONS GIVEN PRIOR PROCEDURE:  Valium 10 mg p.o.   MEDICATIONS GIVEN DURING THE PROCEDURE:  Versed 2 mg IV.   COMMENTS:  The patient tolerated the procedure well.   HEMODYNAMIC DATA:  The aortic pressure was 145/77, LV was 163/20-30.  There was no aortic valve gradient noted on pullback.   ANGIOGRAPHIC FINDINGS:  The left ventricular angiogram was performed in  the RAO position.  There was apical akinesia and perhaps some mild  dyskinesia.  The global ejection fraction was estimated to be 40%.  There was no mitral regurgitation noted.   CORONARY ARTERIES:  There was extensive coronary calcification.   The right coronary artery had 90% stenosis in its midportion.  It was a  dominant vessel.  There was a 99% small acute marginal vessel.  There  was 30-50% narrowing at the crux.   Left main coronary artery had calcification but otherwise was normal.   Left circumflex:  The left circumflex was totally occluded.  There was  calcification and TIMI grade 3 flow and a questionable filling defect  after the essentially total occlusion.  There is extensive calcification  in the vessel.   The first diagonal vessel had an 80% ostial lesion.  He had good distal  vessels that would be suitable for bypass  grafting.   The left anterior descending had proximal irregularities.  There was 50-  60% narrowing in the midportion.  There is distal 90% stenosis as it  crosses the apex.  There is collaterals to what appear to be a distal  right coronary artery branch.   Left internal mammary artery:  The left internal mammary artery had  satisfactory flow from the subclavian and proximal portions.   OVERALL IMPRESSION:  1. Moderate left ventricular dysfunction with distal apical akinesia      with moderate reduction of ejection fraction of approximately 40%.  2. Extensive three-vessel coronary disease with 90% mid right coronary      artery, totally occluded left circumflex, 80% ostial large first      diagonal and distal left anterior descending disease with moderate      proximal disease.  3. Extensive coronary calcification.   DISCUSSION:  In light of the extensive nature of these findings, it is  felt that Dema Severin would be best served with coronary artery bypass  grafting.      Colleen Can. Deborah Chalk, M.D.  Electronically Signed  SNT/MEDQ  D:  06/06/2006  T:  06/06/2006  Job:  [id number]

## 2010-09-04 NOTE — Op Note (Signed)
[place]  Patient:    [name], [name]                        MRN: [id number] Proc. Date: 01/05/00 Adm. Date:  [id number] Disc. 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  OPERATIONS:  Diagnostic arthroscopy glenohumeral joint left shoulder; open resection distal clavicle left shoulder; NEER anterior 1/3rd acromioplasty left shoulder.  SURGEON:  Lenard Galloway. Chaney Malling, M.D.  ANESTHESIA:  General.  DESCRIPTION OF PROCEDURE:  The patient was placed on the operating table in the supine position.  After oral endotracheal anesthesia the patient was placed in a semisitting position.  The right upper extremity was prepped with DuraPrep and the left upper extremity was draped out in the usual manner. Through a posterior portal arthroscope was inserted into the glenohumeral joint.  The shoulder was then distended.  A very careful examination of the intra-articular structures were accomplished.  The articular cartilage over the humerus appeared absolutely normal.  The glenoid was visualized and was absolutely normal.  The anterior labrum was intact and was palpated with an 18 gauge spinal needle.  The biceps tendon appeared normal.  There were no tears at the origin of biceps tendon.  The rotator cuff was then evaluated very carefully and no tears or erosions were about the supraspinatus tendon or rotator cuff.  No lose bodies or other abnormalities were seen.  No synovitis. Essentially this was a normal arthroscopic examination of the shoulder with no significant pathology.  Once this was accomplished the arthroscope was then removed.  An incision made over the distal clavicle out to the tip of the acromion. Skin  edges were retracted.  Bleeders were coagulated.  A subperiosteal dissection of the distal clavicle was then accomplished.  Gelpis were placed in the wound.  Throughout the procedure the shoulder was irrigated with copious amounts of antibiotic solution.  The distal clavicle was then exposed at the [facility] joint.  There is significant osteoarthritis at the distal end of the clavicle.  There is essentially no articular cartilage over the distal clavicle and this had a cobblestone appearance.  Using a small power saw about 8 to 9 mm of distal clavicle was then resected.  Excellent decompression was achieved.  The deltoid fibers were then released off the anterior aspect of the acromion.  The deltoid fibers then split parallel to the muscular bundles. The saw was then used to do an anterior one third acromioplasty of the acromion.  Excellent decompression was achieved.  Again, the shoulder was irrigated with antibiotic solution.  Using 0 Vicryl the periosteum was then brought in a pants-over-vest fashion using interrupted sutures.  A complete excellent closure of the distal clavicle with excellent stability was achieved in the area between the distal clavicle and the acromion and was tied together in pursestring fashion.  The deltoid fibers were then reattached to the anterior aspect of the acromion.  Excellent repair was achieved.  Then 2-0 Vicryl was then used to close the subcutaneous tissue and stainless steel staples used to close the skin.   Sterile dressings were applied and the patient returned to recovery room in  excellent condition.  Technically this procedure went extremely well.  COMPLICATIONS:  None.  DRAINS:  None. DD:  01/05/00 TD:  01/07/00 Job: [id number]

## 2010-09-04 NOTE — Op Note (Signed)
[name], [name]                 ACCOUNT NO.:  [id number]   MEDICAL RECORD NO.:  [id number]          PATIENT TYPE:  INP   LOCATION:  2314                         FACILITY:  [facility]   PHYSICIAN:  Evelene Croon, M.D.     DATE OF BIRTH:  [birth year]   DATE OF PROCEDURE:  06/10/2006  DATE OF DISCHARGE:                               OPERATIVE REPORT   PREOPERATIVE DIAGNOSIS:  Severe three-vessel coronary artery disease  with unstable angina.   POSTOPERATIVE DIAGNOSIS:  Severe three-vessel coronary artery disease  with unstable angina.   OPERATIVE PROCEDURE:  1. Median sternotomy.  2. Extracorporeal circulation.  3. Coronary artery bypass graft surgery x4 using a left internal      mammary artery graft to the optional diagonal coronary artery, a      right internal mammary artery graft to the left anterior descending      coronary artery, a radial artery graft to the obtuse marginal      branch of the left circumflex coronary artery, and a radial artery      graft to the posterior descending branch of the right coronary      artery.  4. Insertion of right common femoral arterial line for blood pressure      monitoring.   ATTENDING SURGEON:  Evelene Croon, MD   ASSISTANT:  Ammie Ferrier, PA-C   ANESTHESIA:  General endotracheal.   CLINICAL HISTORY:  This patient is a 65 year old gentleman with history  of diabetes and diffuse vascular disease, who has a history of coronary  disease and has been treated medically in the past.  He was admitted  with unstable anginal symptoms.  Cardiac catheterization showed severe  three-vessel disease with extensively calcified coronary arteries.  The  LAD had proximal 50% to 60% stenosis.  There was also about 50% to 60%  midvessel stenosis and a 90% distal stenosis at the apex.  The first  diagonal branch was a moderate-sized vessel that had 80% ostial  stenosis.  The left circumflex was occluded proximally and filled with  TIMI-2 flow into the  distal vessel.  The right coronary artery had 90%  midvessel stenosis.  Left ventricular ejection fraction was about 40%  with apical akinesis.  There was no significant mitral regurgitation.  The patient also had a history of severe peripheral vascular disease,  status post bilateral femoropopliteal bypass grafting with saphenous  vein from the legs.  He was also status post left carotid endarterectomy  following a stroke in 2005.  Since there was no lower extremity  saphenous vein to use, we did upper extremity arterial Doppler exams  which showed normal flow in the palmar arch bilaterally with ulnar and  radial compression.  Therefore, plans were made use of both radial  arteries as well as bilateral internal mammary artery grafts.  I  discussed the operative procedure with the patient and his wife  including use of both internal mammary artery grafts and both radial  artery grafts.  I discussed alternatives, benefits, and potential risks  including, but not limited to, bleeding, blood transfusion, infection,  stroke, myocardial infarction, graft failure, paresthesias in the upper  extremities and hands secondary to the sternal retraction and harvesting  of the radial arteries, and possible difficulties healing his sternum  and an increased risk of mediastinitis since we are using bilateral  internal mammary grafts.  I also discussed the small possibility that he  may have death as an outcome from the surgery.  He understood all this  and agreed to proceed with surgery.   OPERATIVE PROCEDURE:  The patient was taken to the operating room and  placed on the table in a supine position.  After induction of general  endotracheal anesthesia, a Foley catheter was placed in the bladder  using sterile technique.  Then a right common femoral arterial line was  placed using standard Seldinger technique.  The femoral artery was  somewhat calcified.  There was a good arterial tracing and no  hematoma  formation.  Then the patient was positioned supine with the arms out on  arm boards.  The neck, chest, abdomen and both groins as well as both  arms were prepped with Betadine soaping solution and draped into one  sterile field.  Then the left and right radial arteries were harvested.  These were exposed through a longitudinal incision over the artery.  The  arteries were occluded distally with an atraumatic vascular clamp and  Doppler flow beyond the clamp was confirmed, indicating a patent  superficial palmar arch.  Then the radial arterial branches were divided  using the harmonic scalpel.  The arteries were divided proximally and  distally and the stumps suture-ligated with 2-0 silk suture ligatures.  Both arteries were flushed with papaverine and saline solution and the  side branches were clipped.  The wounds were irrigated with saline.  There was complete hemostasis.  The fascia was reapproximated with  continuous 2-0 Vicryl suture.  The subcutaneous tissue was closed with a  continuous 2-0 Vicryl suture and the skin with a 3-0 Vicryl subcuticular  closure.  The sponge, needles and instrument counts were correct  according to the scrub nurse.  Dry sterile dressings were applied over  the incisions.  The arms were wrapped with sterile Ace wraps.  Then the  arms were repositioned at the patient's sides for the remainder of the  surgery.   Then the chest was opened through a median sternotomy incision and the  pericardium opened in the midline.  Examination of the heart showed good  ventricular contractility.  The ascending aorta was of normal size and  relatively soft with no palpable plaque present.   Then the left internal mammary artery was harvested from the chest wall  as a pedicle graft.  This was a medium-caliber vessel with excellent  blood flow through it.  Then the right internal mammary artery was harvested as a pedicle graft.  This artery was also of medium  size and  good quality.   Then the patient was heparinized and when an adequate activated clotting  time was achieved, the distal ascending aorta was cannulated using a 22-  French aortic cannula for arterial inflow.  Venous outflow was achieved  using a 2-stage venous cannula through the right atrial appendage.  An  antegrade cardioplegia and vent cannula was inserted in the aortic root.   The patient placed on cardiopulmonary bypass and the distal coronary  arteries identified.  He had severe diffuse three-vessel coronary artery  disease.  There was calcified plaque extending the whole length of all  the  arteries.  The LAD did have an area in the midportion that was soft  and suitable for grafting, at least on the anterior wall of the vessel.  The optional diagonal branch was heavily calcified proximally and then  the midportion became intramyocardial.  It exited distally, where it was  suitable for grafting, but still had some plaque present.  The obtuse  marginal branch was lying fairly high on the lateral wall and was a  medium-sized graftable vessel.  The right coronary artery was diffusely  diseased.  The posterior descending branch was a medium-sized graftable  vessel.   Then the aorta was crossclamped and 1000 mL of cold blood antegrade  cardioplegia were administered in the aortic root with quick arrest of  the heart.  Systemic hypothermia to 28 degrees centigrade and topical  hypothermic with iced saline was used.  A temperature probe was placed  in septum and an insulating pad in the pericardium.   Then 1st distal anastomosis was performed to the obtuse marginal branch.  The internal diameter was about 1.6 mm.  The conduit that was used was  the left radial artery graft and this was anastomosed in an end-to-side  manner continuous 8-0 Prolene suture.  Flow was measured through the  graft and was excellent.   The 2nd distal anastomosis was then performed to the optional  diagonal  branch.  The internal diameter of this vessel distally was about 1.6 mm.  The conduit that used was the left internal mammary graft and this was  brought through an opening in the left pericardium anterior to the  phrenic nerve.  It was anastomosed to the optional diagonal branch using  a continuous 8-0 Prolene suture in an end-to-side manner.  Flow was  noted through the graft and was excellent.  The pedicle was suture to  the epicardium with 6-0 Prolene sutures.  Then another dose of  cardioplegia was given in the aortic root.   The 3rd distal anastomosis was performed to the posterior descending  coronary artery.  The internal diameter of this vessel was about 1.6 mm.  The conduit that was used was the other radial artery graft and this was  anastomosed in an end-to-side manner using continuous 8-0 Prolene  suture.  Flow was measured through the graft and was excellent.  Then the 2 proximal anastomoses of the radial artery grafts were  performed to the aortic root in an end-to-side manner using continuous 7-  0 Prolene suture.  Then another dose of cardioplegia was given.  The  final distal anastomosis was then performed to the midportion of the  LAD.  The internal diameter here was about 1.75 mm.  The conduit that  was used was the right internal mammary graft.  This was brought through  a split in the right pericardium anterior to the phrenic nerve.  It was  anastomosed to the LAD in an end-to-side manner using continuous 8-0  Prolene suture.  The pedicle was suture to the epicardium with 6-0  Prolene sutures.  Then the patient was rewarmed to 37 degrees  centigrade.  The clamps were removed from the mammary pedicles.  The  crossclamp was removed with a time of 117 minutes.  There was  spontaneous return of sinus rhythm.  The proximal and distal anastomoses  appeared hemostatic and the lines of the grafts satisfactory.  Graft  markers were placed around the proximal  anastomoses.  Two temporary  right ventricular and right atrial pacing wires  were placed and brought  out through the skin.   When the patient had rewarmed to 37 degrees centigrade, he weaned from  cardiopulmonary bypass on no inotropic agents.  Total bypass time was  139 minutes.  Cardiac function appeared excellent with a cardiac output  of 5 L a minute.  Protamine was given and the venous and aortic cannulas  were removed without difficulty.  Hemostasis was achieved.  Four chest  tubes were placed with a tube in the posterior pericardium, 1 in the  anterior mediastinum and 1 in each pleural space.  The sternum was then  reapproximated with double #6 stainless steel wires.  The fascia was  closed with a continuous #1 Vicryl suture.  Subcutaneous tissue was  closed with a continuous 2-0 Vicryl and the skin with a 3-0 Vicryl  subcuticular closure.  The sponge, needle and instrument counts were  correct according to the scrub nurse.  Dry sterile dressings were  applied over the incisions and around the chest tubes, which were hooked  to Pleur-evac suction.  The patient remained hemodynamically stable and  was transported to the SICU in guarded, but stable condition.      Evelene Croon, M.D.  Electronically Signed     BB/MEDQ  D:  06/10/2006  T:  06/11/2006  Job:  [id number]   cc:   Colleen Can. Deborah Chalk, M.D.  [facility]

## 2010-09-04 NOTE — H&P (Signed)
[place]. [facility]  Patient:    RENALD, HAITHCOCK Visit Number: [id number] MRN: [id number]          Service Type: Attending:  Denman George, M.D. Dictated by:   Durenda Age, P.A.-C. Adm. Date:  02/24/01   CC:         Colleen Can. Deborah Chalk, M.D.                         History and Physical  DATE OF BIRTH:  Sep 22, 1945  CHIEF COMPLAINT:  PVOD.  HISTORY OF PRESENT ILLNESS:  Mr. Stork is a pleasant 65 year old white male with known history of PVOD status post right PTA of the popliteal artery in 1996 and again in 2001.  The patient returned for a follow-up visit in October 2002 with new complaints of claudication in the right leg.  Dopplers demonstrated a decrease in the right ABI from greater than 1.0 to 0.58 now, consistent with restenosis of his right superficial femoral artery at the previous PTA site.  Dr. Madilyn Fireman, upon evaluation, recommended to proceed with a new arteriogram on February 24, 2001 and if possible, proceed with redo angioplasty in the area.  The patient complains of right thigh pain extending to the calf especially with ambulation, as well as right foot pain. Occasionally, he experiences rest and night pain.  He denies any slow healing ulcers or gangrenous changes.  No ischemic changes.  No peripheral edema.  He notices a slight decrease in temperature in his toes.  PAST MEDICAL HISTORY: 1. PVOD. 2. Hypertension. 3. Hypothyroidism. 4. History of seizure disorder. 5. CAD status post MI x 3. 6. GERD symptoms. 7. NIDDM. 8. Osteoarthritis. 9. History of nephrolithiasis.  SURGERY: 1. Status post fem-pop bypass graft in 1998 - Dr. Madilyn Fireman. 2. Status post right PTA of the popliteal artery on February 16, 2000 -    Dr. Madilyn Fireman. 3. Status post right PTA of the right popliteal artery in 1996. 4. Status post PTCA x 4 in 1991, 1992, 1994, and 1998. 5. Status post cholecystectomy in 1987. 6. Status post shoulder surgery in  2001.  MEDICATIONS:  1. Tegretol 100 mg three p.o. q.a.m., two p.o. at noon, three p.o. in early     evening, and two p.o. at bedtime.  2. Depakote 500 mg one p.o. q.a.m., one p.o. at noon, one p.o. early     evening, two p.o. before bedtime.  3. Procardia XL 90 mg q.d.  4. Synthroid 0.175 mg q.d.  5. Lopressor 50 mg one-and-a-half p.o. b.i.d.  6. Tenex 2 mg q.d.  7. Imdur 60 mg q.d.  8. Lipitor 40 mg q.d.  9. Protonix 40 mg q.d. 10. Glucotrol XL 5 mg q.d. 11. Altace 10 mg q.d. 12. Tylox 500 mg p.o. p.r.n.  ALLERGIES:  MORPHINE causes rash.  REVIEW OF SYSTEMS:  See HPI and past medical history for significant positives.  Occasionally, he experiences anginal symptoms, quickly resolved. No palpitations.  He has frequent shortness of breath with dyspnea on exertion after ambulating.  No recent history of kidney disease.  No asthma.  FAMILY HISTORY:  Mother alive; history of diabetes, heart disease, and PVOD. Father died in a motor vehicle accident at 51.  He has three brothers who died of cancer.  SOCIAL HISTORY:  Married.  He has one grown son.  He is on disability.  He served in battle during the [place] War.  He quit in 1995 the use of  one to two packs of cigarettes for 25 years.  He quit 20 years ago heavy alcohol intake.  PHYSICAL EXAMINATION:  GENERAL:  This is a well-developed, well-nourished 65 year old white male in no acute distress, alert and oriented x 3.  VITAL SIGNS:  Blood pressure 130/70, pulse 68, respirations 18.  HEENT:  Normocephalic, atraumatic.  PERRLA, EOMI.  Funduscopic exam reveals beginning stages of cataracts on the right and questionable left macular degeneration.  NECK:  Supple.  No JVD.  Right faint bruit.  No lymphadenopathy.  CHEST:  Symmetric on inspirations.  LUNGS:  With the exception of mild atelectasis in the right base, no wheezes, rhonchi, or rales.  No lymphadenopathy.  CARDIOVASCULAR:  Regular rate and rhythm.  No murmurs,  rubs, or gallops.  ABDOMEN:  Moderately obese.  Soft, nontender.  Bowel sounds x 4.  No masses or bruits.  GENITOURINARY AND RECTAL:  Deferred.  EXTREMITIES:  No clubbing, cyanosis, or edema.  No ulcerations.  Warm temperature.  The right toe has slightly decreased temperature.  PERIPHERAL PULSES:  Carotid 2+; femoral 2+; popliteal, dorsalis pedis, and posterior tibialis 2+ on the left, absent on the right.  NEUROLOGIC:  With the exception of mild resting tremor on the right index finger, no other neurological findings are seen.  The patient has not had a recurrence of any type of seizures during the past year.  Gait steady.  DTRs 2+.  Muscle strength 5/5.  ASSESSMENT AND PLAN:  Peripheral vascular occlusive disease, for arteriogram on February 24, 2001 with possible right percutaneous transluminal angioplasty for resolution of the claudication symptoms.  Dr. Madilyn Fireman has seen and evaluated this patient prior to the admission and has explained the risks and benefits involved in the procedure, and the patient has agreed to continue. Dictated by:   Durenda Age, P.A.-C. Attending:  Denman George, M.D. DD:  02/22/01 TD:  02/22/01 Job: [id number]

## 2010-09-04 NOTE — Cardiovascular Report (Signed)
[place]. [facility]  Patient:    [name], [name]                        MRN: [id number] Proc. Date: 02/18/00 Adm. Date:  [id number] Attending:  Melvenia Needles CC:         Colleen Can. Deborah Chalk, M.D.  Peripheral Vascular Catheterization Lab, [facility]   Cardiac Catheterization  DIAGNOSIS:  Right lower extremity claudication.  PROCEDURE: 1. Right femoral arteriogram. 2. Right superficial femoral percutaneous transluminal angioplasty. 3. Right popliteal percutaneous transluminal angioplasty.  SURGEON:  Denman George, M.D.  ACCESS:  Right common femoral artery antegrade 6-French sheath.  CONTRAST: 65 ml Visipaque (generalized severe debilitation).  COMPLICATIONS:  None apparent.  CLINICAL NOTE:  This is an 65 year old male with advanced peripheral vascular disease.  He is status post left superficial femoral-to-popliteal bypass. Approximately five years ago, he underwent right superficial femoral percutaneous transluminal angioplasty.  he did well initially from this until the past several months when he developed recurrent claudication.  He is brought to the [facility] Peripheral Catheterization Lab at this time for redo right superficial femoral percutaneous transluminal arteriography.  PROCEDURE NOTE:  The patient was brought to the catheterization lab in stable condition, placed in supine position, informed consent obtained.  Intravenous sedation 2 mg Versed, 4 mg Nubain administered.  Skin and subcutaneous tissue in right groin instilled with 1% Xylocaine.  Antegrade needle stick made in the right common femoral artery.  A 0.035 J wire passed through the needle into the superficial femoral artery under fluoroscopy.  A 6-French sheath passed over the guidewire and dilator removed. Sheath flushed with heparin and saline solution.  The patient was administered 3000 units of heparin intravenously.  A Wholey guidewire was then  advanced down to the area of superficial femoral artery stenosis.  There was heavily calcified exophytic plaque evident.  The Wholey guidewire was manipulated but could not be advanced through this.  The Wholey guidewire removed and a 0.035 glide wire advanced.  With the assistance of an end hole catheter, the glide wire was advanced across the stenotic distal right superficial femoral artery.  The end hole catheter, however, could not be advanced beyond this.  The end hole catheter was removed.  A Powerflex 4 x 2 balloon was advanced over the glide wire and could be advanced beyond the stenotic lesion.  The glide wire was removed.  A small injection of contrast made through the lumen of the balloon catheter, and this verified intraluminal position distally.  A Wholey guidewire was then readvanced through the balloon lumen.  A ______ tape placed on the right thigh.  Hand contrast injection made of 10 cc.  This verified position of the right superficial femoral stenosis.  The Powerflex balloon was appropriate positioned and inflated at 12 atmospheres x 30 seconds, the balloon then repositioned and inflated 10 atmospheres x 30 seconds.  The balloon was withdrawn, and repeat hand ingestion of contrast revealed improved lumen.  There was, however, heavily calcified exophytic plaque evident.  Repeat hand injection of 10 cc contrast revealed evidence of an area of radiolucency consistent with moderate narrowing in the popliteal artery.  The 4 x 2 balloon was advanced to this and inflated at 8 atmospheres x 40 seconds. the balloon was then withdrawn.  Completion arteriogram obtained with injections of 10 cc of contrast solution by hand antegrade through the sheath.  This verified moderate residual stenosis in the  right superficial artery and minimal residual stenosis in the right popliteal artery.  This was felt the represent the maximal technical result obtainable at this time.  The  guidewire was removed.  There were no apparent complications.  The patient was transferred to the holding area for removal of right femoral sheath.  FINAL IMPRESSION: 1. Successful right superficial femoral artery percutaneous transluminal    angioplasty with Powerflex 4 x 2 balloon. 2. Successful right popliteal percutaneous transluminal angioplasty with    Powerflex 4 x 2 balloon. DD:  02/18/00 TD:  02/18/00 Job: [id number]

## 2010-09-04 NOTE — H&P (Signed)
[place]. [facility]  Patient:    [name], [name]                          MRN: [id number] Adm. Date:  09/01/99 Attending:  Colleen Can. Deborah Chalk, M.D. Dictator:   Jennet Maduro. Earl Gala, R.N., A.N.P. CC:         Oleh Genin, M.D., [place], [place]                         History and Physical  CHIEF COMPLAINT:  Prolonged chest pain.  HISTORY OF PRESENT ILLNESS:  Mr. Blouch is a 65 year old white male who has known atherosclerotic cardiovascular disease.  He has had previous history of revascularization.  He presents to the emergency room this morning with refractory chest pain.  He notes that he awoke at approximately 5 a.m. with chest pain more located to to the left side, with radiation down the left arm and some numbness of the fingers.  He has had some associated shortness of breath and possibly some diaphoresis.  He took nitroglycerin x 2 at home without relief and subsequently was referred here.  Since he has been in the emergency room he has received IV nitroglycerin, heparin, and morphine and still continues to have chest pain, 6-7 on a scale of 0/10.  He is now admitted for further evaluation as well as for cardiac catheterization.  PAST MEDICAL HISTORY:  1. ASCVD with a history of angioplasty to the left circumflex, right coronary     artery on two occasions in the LAD in 1993.  His last catheterization was     in March 1998 showing diffuse and extensive coronary disease with     calcifications and inferoapical hypokinesis with previous infarction with     continued medical therapy.  He did have an adenosine Cardiolite study in     February of this year which showed inferoapical myocardial infarction scar     with wall motion, as well as a perfusion defect.  EF was 47% and there was     mild inferior ischemia and he was continued to be treated medically since     that point in time.  2. Seizure disorder.  3. Hyponatremia felt to be due to Tegretol.  4. Hypertension.  5. Hypothyroidism, currently on replacement.  6. Peripheral vascular disease, status post angioplasty of the iliac vessels     on the right in 1996, with a left femoral patch angioplasty in October     1998.  7. Hypercholesterolemia.  8. Diabetes, currently non-insulin dependent and on oral agents.  9. Chronic pain syndrome. 10. History of ECT for personality disorder. 11. Status post cholecystectomy. 12. Obesity. 13. Prior tobacco use.  ALLERGIES:  None known.  CURRENT MEDICATIONS:  1. Procardia XL 90 mg a day.  2. Lopressor 75 b.i.d.  3. Tenex 2 mg a day.  4. Synthroid 0.15 daily.  5. Tegretol 200 mg 5 times a day.  6. Depakote 500 mg 1 tablet in the morning, 1 tablet at supper, 2 tablets at     bedtime.  7. Nitroglycerin p.r.n.  8. Tylox 3 times a day, chronically.  9. Xanax 0.5 daily. 10. Lipitor 10 q.d. 11. Glucotrol XL 5 q.d.  FAMILY HISTORY:  Mother has had a history of diabetes.  Father died secondary to drowning.  SOCIAL HISTORY:  He is married with one daughter.  He is disabled.  There is  no recent tobacco abuse reported.  REVIEW OF SYSTEMS:  Basically as noted above.  He states he had previously been doing fairly well up until this morning.  He has noted to have some frequent night sweats of recent onset which is of questionable etiology. There has been no fevers.  There may be some associated diarrhea.  There has been no abdominal pain.  No constipation.  No blood in the stool.  No complaints of palpitations.  He remains on chronic Tylox therapy for chronic pain syndrome and is managed at the pain clinic at [facility].  He continues to have chronic pain in the left neck and shoulder, and certainly his symptoms are very difficult to sort out whether they are musculoskeletal origin or of other etiology.  PHYSICAL EXAMINATION:  GENERAL:  He is alert.  He is continuing to complain of chest pain.  He does not appear to be in any acute  distress.  VITAL SIGNS:  Blood pressure is 132/79, heart rate 76, respirations 20.  SKIN:  Warm and dry.  Color is unremarkable.  LUNGS:  Diminished breath sounds in the bases.  CARDIAC:  Regular rhythm.  ABDOMEN:  Obese, yet soft.  Positive bowel sounds.  EXTREMITIES:  Without edema.  LABORATORY:  EKG shows no acute changes.  Chest x-ray shows some scarring of the bases, but no acute abnormality.  Other labs are pending.  IMPRESSION:  1. Acute chest pain.  2. Known atherosclerotic cardiovascular disease.  3. Hypertension.  4. Chronic pain syndrome.  5. Hypercholesterolemia.  6. Diabetes, non-insulin dependent.  PLAN:  He will be admitted.  We will check follow-up labs to include CK-MBs serially.  We will continue IV nitroglycerin and IV heparin.  We will proceed on with cardiac catheterization this morning.DD:  09/01/99 TD:  09/01/99 Job: [id number]

## 2010-09-04 NOTE — Cardiovascular Report (Signed)
[place]. [facility]  Patient:    [name], [name]                        MRN: [id number] Proc. Date: 09/01/99 Adm. Date:  [id number] Attending:  Eleanora Neighbor                        Cardiac Catheterization  REFERRING PHYSICIAN:  Colleen Can. Deborah Chalk, M.D.  INDICATIONS:  Mr. Resnick presents with acute onset of chest pain of 5 oclock in the morning.  ECG showed nonspecific changes.  He has had multiple catheterizations, as well as angioplasties in the past.  He has peripheral vascular disease as well as cervical neuropathy and has a degree of ongoing pain in his neck and shoulders.  This pain was more severe, required nitroglycerin and was located in the anterior chest and was ongoing in the emergency room.  PROCEDURE:  Left heart catheterization with selective coronary angiography, left ventricular angiography.  TYPE AND SITE OF ENTRY:  Percutaneous right femoral artery with Perclose.  CATHETERS:  A 6 French 4 curved Judkins right and left coronary catheters, 6 French pigtail ventriculographic catheter (7 [place] sheath).  MEDICATIONS GIVEN PRIOR TO THE PROCEDURE:  Morphine, IV nitroglycerin and heparin.  MEDICATIONS GIVEN DURING THE PROCEDURE:  Morphine.  COMMENTS:  The patient tolerated the procedure well.  HEMODYNAMIC DATA:  The aortic pressure was 149.86, LV was 150/18. There was no aortic valve gradient noted on pullback.  ANGIOGRAPHIC DATA: The coronary arteries were extensively calcified throughout almost all segments. 1. Left main coronary artery:  Left main coronary artery has very mild    proximal narrowing. 2. Left circumflex:  The left circumflex is extensively calcified.  It    continues as a large bifurcating obtuse marginal system.  There is    calcification but no real significant focal disease. 3. Left anterior descending:  The left anterior descending extends to and    around the apex.  It bifurcates at the apex.  There is  extensive    calcification in the left anterior descending.  There are scattered 30-40%    narrowings in the left anterior descending but no significant focal    stenosis is present. 4. Intermediate coronary:  The intermediate coronary is of a moderate size.    It has a 50% narrowing near its beginning portion and distal vessel is    relatively large and free of significant focal disease. 5. Right coronary artery:  The right coronary artery is a moderate sized    dominant vessel.  There is extensive calcification throughout the vessel.    The right coronary artery itself has irregularities of 50-60% nature    scattered throughout but has satisfactory flow.  There is a small 1 mm    acute marginal vessel that has slow antegrade flow and is noted to have    some retrograde from left to right collaterals and is presumed to be    the infarct related vessel.  This vessel would not be approachable with    angioplasty because of its small nature.  LEFT VENTRICULOGRAPHY:  Left ventricular angiogram was performed in the RAO projection.  The overall cardiac size and silhouette are normal.  The global ejection fraction would be estimated to be in the 55-60% range. Regional wall motion appears to be satisfactory, although there may be some slight hypokinesis.  OVERALL IMPRESSION: 1. Well-preserved left ventricular function with  mild apical hypokinesis. 2. Extensive coronary calcification. 3. Mild to moderate diffuse coronary atherosclerosis with severe stenosis in    acute marginal vessel of the right coronary artery with 30-50% narrowings    throughout the right coronary artery, 50% narrowing in the proximal    intermediate, 30-40% narrowings in the left anterior descending and 20-30%    narrowings in the left circumflex.  DISCUSSION:  We will manage Izmael along medically.  He is at low risk from a pure cardiac standpoint, but his problem remains that of ongoing severe angina in spite of this  disease.  However, it is felt that his prognosis is satisfactory and we have managed conservatively. DD:  09/01/99 TD:  09/02/99 Job: [id number]

## 2010-09-04 NOTE — H&P (Signed)
[place]. [facility]  Patient:    [name], [name]                        MRN: [id number] Adm. Date:  [id number] Attending:  Melvenia Needles Dictator:   Lissa Hoard, P.A.-C.                         History and Physical  DATE OF BIRTH:  1945-08-28  HISTORY OF PRESENT ILLNESS:  This is a 65 year old white male with known history of peripheral vascular occlusive disease and claudication, who is status superficial femoral and popliteal bypass graft in 1998, and a right percutaneous transluminal angioplasty in 1996, who has been followed closely by Dr. Denman George.  Recently he developed right calf claudication, which prompted a further evaluation by Dr. Madilyn Fireman.  A workup revealed a possible reocclusion or stenosis of the right popliteal artery.  Because of his condition, Dr. Madilyn Fireman recommended a percutaneous transluminal angioplasty of the right popliteal artery.  He underwent this procedure on February 18, 2000.  PAST MEDICAL HISTORY:  1. Peripheral vascular disease.  2. Hypertension.  3. Hypothyroidism.  4. History of seizure disorder.  5. Coronary artery disease.  6. Hypercholesterolemia.  7. Gastroesophageal reflux disease.  8. Non-insulin-dependent diabetes mellitus.  9. Osteoarthritis. 10. Nephrolithiasis. 11. Status post myocardial infarction.  PAST SURGICAL HISTORY: 1. Status post left superficial femoral artery popliteal bypass with    a reverse saphenous vein graft in April 1998. 2. Status post right popliteal artery percutaneous transluminal    angioplasty on February 14, 1995. 3. Status post percutaneous transluminal coronary angioplasty x 4    in 1991, 1992, 1994, and 1998. 4. Status post cholecystectomy in 1987. 5. Status post shoulder surgery in 2001.  CURRENT MEDICATIONS:  1. Aspirin 81 mg p.o. q.d.  2. Tegretol 200 mg q.i.d.  3. Depakote 500 mg q.i.d.  4. Procardia XL 90 mg q.d.  5. Synthroid 0.175 mg q.d.  6. Lopressor 50  mg 1-1/2 tablets b.i.d.  7. Tenex 2 mg q.d.  8. Imdur 60  mg q.d.  9. Lipitor 20 mg q.d. 10. Protonix 40 mg q.d. 11. Glucotrol XL 5 mg q.d. 12. Plavix 75 mg q.d.  ALLERGIES:  The patient reports no known medication allergies.  REVIEW OF SYSTEMS:  The patient denies any kidney disease, asthma, chronic obstructive pulmonary disease, TIA, cerebrovascular accident, amaurosis fugax, syncope, or presyncope.  He denies any history of PE, or deep vein thrombosis. No GI bleeding.  No diarrhea, constipation, or hematuria.  See the history of present illness for other significant positives.  FAMILY HISTORY:  The patient states his mother is still living and has a history of heart disease, diabetes mellitus, and cerebrovascular occlusive disease.  Father was deceased at age 69, secondary to a motor vehicle accident. He has three brothers who died of cancer.  SOCIAL HISTORY:  The patient is a 25-pack-year cigarette smoker, but he quit in 1995.  He denies any alcohol or illicit drug use.  PHYSICAL EXAMINATION:  GENERAL:  The patient was found to be alert and oriented x 3.  HEENT:  Normocephalic, atraumatic.  Eyes:  Pupils equal, round, reactive to light and accommodation.  Extraocular motions intact without nystagmus or scleral icterus.  Ears:  Auditory acuity was grossly intact.  Sinuses were nontender, moist, without exudates.  NECK:  Supple without jugular venous distention, carotid bruits, thyromegaly, or lymphadenopathy.  CHEST:  Symmetrical with nonlabored respirations.  LUNGS:  Clear to auscultation bilaterally.  CARDIAC:  A regular rate and rhythm, without murmurs, gallops, or rubs.  ABDOMEN:  Soft, nontender, nondistended, with positive bowel sounds in all four quadrants.  EXTREMITIES:  No cyanosis, clubbing, or edema; however, dorsalis pedis and posterior tibial pulses were nonpalpable bilaterally.  NEUROLOGIC:  No focal neurological deficits.  Cranial nerves II-XII  were grossly intact.  ASSESSMENT:  Peripheral vascular occlusive disease.  PLAN:  The patient underwent a right popliteal artery percutaneous transluminal angioplasty on February 18, 2000.  He will be admitted for observation for 24 hours. DD:  02/18/00 TD:  02/18/00 Job: [id number]

## 2010-09-04 NOTE — H&P (Signed)
NAME:  [name], [name]                           ACCOUNT NO.:  [id number]   MEDICAL RECORD NO.:  [id number]                   PATIENT TYPE:  OIB   LOCATION:  2899                                 FACILITY:  [facility]   PHYSICIAN:  Balinda Quails, M.D.                 DATE OF BIRTH:  29-Nov-1945   DATE OF ADMISSION:  09/13/2002  DATE OF DISCHARGE:                                HISTORY & PHYSICAL   PRIMARY CARE PHYSICIANS:  His physicians are Dr. Denman George, Dr.  Burgess Estelle cardiologist, Dr. Bronson Ing primary caregiver.   PRESENTING CIRCUMSTANCE:   I am having trouble walking.  My right leg gives  out.   HISTORY OF PRESENT ILLNESS:  [name] is a 65 year old male, well known to  the cardiovascular/thoracic surgeons at [facility] for infrainguinal  arterial occlusive disease. He is status post bilateral femoropopliteal  bypass surgeries.  He recently went to [facility] where he did a lot of  walking.  This became increasingly stressful.  He can only walk 1-2 blocks  or for about 10 minutes before his right calf cramps up, he has to stop or  risk falling.  He presented to Dr. Burgess Estelle who noted decreased pulse and  decreased perfusion in the right leg.  He has just had right lower extremity  arteriogram by Dr. Denman George with attempted percutaneous transluminal  angioplasty of a focal stenosis this morning.  He needs revision of his  right femoropopliteal bypass.   ALLERGIES:  MORPHINE SULFATE, nausea.   MEDICATIONS:  1. Imdur 120 mg daily.  2. Protonix 40 mg daily.  3. Procardia XL 90 mg daily.  4. Tenex 2 mg daily.  5. Metoprolol 50 mg twice daily.  6. Lipitor 40 mg at bedtime.  7. Glucotrol XL 5 mg daily.  8. Depakote 500 mg q.i.d.  9. Tegretol 100 mg b.i.d.  10.      Tegretol 200 mg q.i.d.  11.      Synthroid 0.175 mg daily.  12.      Altace 10 mg daily.  13.      Xanax 1 mg at bedtime.  14.      Tylox as needed.   PAST MEDICAL HISTORY:  1. Atherosclerotic coronary  artery disease.  History of myocardial     infarction x4 in the period of 1991 and 1994.  2. Epilepsy seizure disorder.  Grand mal seizure 15 years ago, petit mal     seizure one year ago.  3. Ongoing angina, atypical chest pain.  4. Hypothyroidism.  5. 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  6. Hypercholesterolemia.  7. Hiatal hernia/GERD.  8. Type 2 diabetes mellitus, diagnosed 3-4 years ago.  9. Anxiety.  10.      Hypertension.  11.      History of tobacco habituation.  12.  History of ethanol use.   PAST SURGICAL HISTORY:  1. Status post left femoropopliteal bypass using greater saphenous vein as     conduit 1998.  2. Status post right femoropopliteal bypass December 2002 using greater     saphenous vein conduit.  3. Status post PCTA of coronary on four different occasional.  4. Status post cholecystitis in 1986.  5. Status post appendectomy in childhood.  6. Normal nuclear stress test for the right femoropopliteal bypass, December     2002.  7. Status post left shoulder surgery to remove bone spurs.   SOCIAL HISTORY:  The patient is married for the second time for 24 years,  has a supportive spouse.  He has one child, a daughter, who attempted  suicide yesterday, and now in intensive care.  The patient is disabled,  worked as a Academic librarian, Buyer, retail.  History of tobacco  habituation over a 27-year-period, stopped after a second heart attack at  age 73.  History of ethanol use, but none in the last 24 years.   FAMILY HISTORY:  Mother living with a history of diabetes and angina.  Father drowned in a boating accident in 26.  He has three brothers, one  brother with a history of testicular cancer at age 26, and second with  cancer of the chest wall, more recently he is in to both ethanol and drug  abuse.  One brother at age 70 with hypertension, one brother status post  cerebral aneurysm with speech  difficulty.  He was a heavy smoker/drinker.  One sister, age 31, good health.   PHYSICAL EXAMINATION:  GENERAL:  Alert, oriented male, in no acute distress,  status post right lower extremity angiogram earlier this morning.  VITAL SIGNS:  Temperature 97.6, blood pressure 117/90, respirations 18,  pulse is 66 and regular, weight 237.5 pounds.  HEENT:  Speech is slow, mentation is hesitant.  Extraocular movements  intact.  Pupils equal, round and reactive to light.  He has a left oral  angle droop, wears an upper denture.  NECK:  No jugular venous distention, no carotid bruits auscultated.  No  cervical lymphadenopathy.  LUNGS:  Clear to auscultation and percussion bilaterally.  Breath sounds are  diminished.  HEART:  Regular rate and rhythm, without murmur.  ABDOMEN:  Soft, nondistended, bowel sounds are present.  No masses present  on palpation.  No bruits auscultated.  EXTREMITIES:  Show well-healed cicatrices to both legs.  Femoral pulses on  the right are not palpable.  On the left they were not palpated secondary to  recent angiography.  The left foot has a palpable dorsalis pedis pulse, the  right foot is cool without ulceration however.  The right posterior tibial  there is a strong Doppler signal.  Doppler signals are absent in the right  dorsalis pedis and right perineal area.  NEUROLOGIC:  Speech is slow, but clear.  Grip strength is 5/5 bilaterally.  He walks at home with a cane.  He has no focal deficits.    IMPRESSION:  1. Infrainguinal arterial occlusive disease.  2. Ischemic right lower extremity with progressive claudication symptoms.   PLAN:  Revision of right femoropopliteal bypass, September 19, 2002 by Dr. Denman George.     Maple Mirza, P.A.                    Balinda Quails, M.D.    GM/MEDQ  D:  09/13/2002  T:  09/13/2002  Job:  [id number]

## 2010-09-04 NOTE — Discharge Summary (Signed)
[place]  Patient:    NICKLAS, MCSWEENEY                        MRN: [id number] Adm. Date:  [id number] Disc. Date: [id number] Attending:  Eleanora Neighbor Dictator:   Genia Del. Riley Nearing, M.D. CC:         Dr. Antionette Poles, [place], [place]   Discharge Summary  PRIMARY DISCHARGE DIAGNOSES: 1. Chronic chest pain syndrome, with negative cardiac enzymes to continue with    medical therapy. 2. Significant hyponatremia with nausea and vomiting, resolving.  SECONDARY DISCHARGE DIAGNOSES: 1. Atherosclerotic vascular disease with a chronic chest pain syndrome with    last cardiac catheterization May 2001, showing well persevered left    ventricular function with mild apical hypokinesis, extensive coronary    calcification, mild to moderate diffuse coronary artery disease with severe    stenosis in the acute marginal vessel of the right coronary with 30-50%    narrowing throughout the right coronary, 50% narrowing in the proximal    intermediate, 30-40% narrowing in the LAD, 20-30% narrowing in the left    circumflex felt to be amenable to medical management. 2. Prior history of hyponatremia felt to be secondary to medications as well    as previous medical procedures. 3. Prior history of angioplasty to the left circumflex and right coronary    artery in 1993. 4. Seizure disorder, maintained on Tegretol and Depakote. 5. Hypertension, currently controlled. 6. Hypothyroidism on Synthroid. 7. Non-insulin-dependent diabetes. 8. Chronic pain syndrome, on chronic Tylox use.  HISTORY OF PRESENT ILLNESS:  Mr. Boehringer is a 65 year old white male who has multiple medical problems.  He had been hospitalized for peripheral vascular angioplasty on November 1.  That procedure apparently went well without any known complications.  He presented to the emergency room at [facility] on February 21, 2000, with complaints of nausea, vomiting, and chest pain.  He was transferred here  due to concern regarding his chest pain syndrome.  It was not mentioned to the receiving physician that the sodium, however, was 120, and was subsequently admitted here for further evaluation.  Please see the dictated history and physical for further patient presentation and profile.  LABORATORY DATA ON ADMISSION:  Originally done at [facility] showed a serum sodium of 121, hemoglobin 13.6, BUN 14, creatinine .6.  Old EKG showing old inferior infarction with nonspecific ST and T wave abnormalities.  HOSPITAL COURSE:  The patient was admitted.  He was initially placed on IV nitroglycerin and heparin.  He ruled out negative for myocardial infarction. It was felt that he was not suffering from an acute coronary syndrome, especially based on the history, and it was felt that his chronic chest pain that he was having was currently aggravated by the retching that he was having associated with nausea and vomiting, presumably due to hyponatremia.  He was placed on fluid restriction, and his home medicines were continued.   He ruled out negative for myocardial infarction per serial enzymes.  Sodium on November 5 was up to 128, and the patient was feeling better.  He had been previously on Darvocet as well as aspirin which was basically causing no resolution in his chest pain.  He was subsequently switched back to Tylox which he takes chronically at home.  Today, on February 23, 2000, he is feeling much better. His chemistries are currently pending, but it is felt that if sodium continues to improve, that  he is felt to be stable for discharge today from a medical standpoint.  CONDITION ON DISCHARGE:  Stable.  DISCHARGE MEDICATIONS:  1. Imdur 60 mg q.d.  2. Synthroid 175 mcg q.d.  3. Tenex 2 mg q.d.  4. Depakote and Tegretol as before.  5. Glucotrol XL 5 mg q.d.  6. Lipitor 10 a day.  7. Lopressor 75 b.i.d.  8. Protonix 40 mg q.d.  9. Aspirin daily. 10. Tylox as before. 11.  Procardia XL 90 mg per day. 12. Nitroglycerin p.r.n. chest pain.  ACTIVITY:  As tolerated.  DIET:  Low fat diabetic.  SPECIAL INSTRUCTIONS:  He is to call for any problems.  We will ask him to touch base with Dr. Antionette Poles for continued medical care.  Otherwise we will plan on seeing him back for cardiology follow-up in approximately two weeks.  He is asked to call to schedule that appointment. DD:  02/23/00 TD:  02/23/00 Job: [id number]

## 2010-09-04 NOTE — Cardiovascular Report (Signed)
NAMEERYK, BEAVERS NO.:  [id number]   MEDICAL RECORD NO.:  [id number]          PATIENT TYPE:  INP   LOCATION:  2907                         FACILITY:  [facility]   PHYSICIAN:  Colleen Can. Deborah Chalk, M.D.DATE OF BIRTH:  Aug 21, 1945   DATE OF PROCEDURE:  07/08/2004  DATE OF DISCHARGE:                              CARDIAC CATHETERIZATION   HISTORY:  [name] presents with neck, shoulder pain, and chest pain.  He  has had multiple episodes of this before.  He has had extensive coronary  disease and vascular disease in the past as well as degenerative joint  disease of the neck.  He had severe and unrelenting chest pain.  His cardiac  enzymes were negative.  He is referred now for catheterization to further  define coronary arteries.   PROCEDURE:  Left heart catheterization with selective coronary angiography  and left ventricular angiography.   TYPE AND SITE OF ENTRY:  Percutaneous right femoral artery (vessel calcified  and I was able to find the femoral artery because of the extensive  calcification).   CATHETERS:  6 French 4 curved Judkins right and left coronary catheters, 6  French pigtail ventriculographic catheter.   MEDICATIONS GIVEN PRIOR TO THE PROCEDURE:  Valium 10 mg p.o.   MEDICATIONS GIVEN DURING THE PROCEDURE:  Versed and Fentanyl.   COMMENTS:  In general, the patient tolerated the procedure well.   HEMODYNAMIC DATA:  The aortic pressure was 105/65, LV 123/21-31, there was  no aortic valve gradient noted on pull back.   ANGIOGRAPHIC DATA:  The left ventricular angiogram was performed in the RAO position.  The  overall cardiac size is normal.  There was discrete inferoapical akinesia.  There was mild inferior basal hypokinesia.  The global ejection fraction,  however, would be estimated to be 50%.  There was no mitral regurgitation  noted.   Coronary arteries:  The coronary arteries arise and distribute normally.  There is extensive calcification  throughout the coronary tree and the entire  coronary vasculature can be identified by the extensive calcification.   1.  Left main coronary artery:  The left main coronary artery has 30-40%      narrowing at its mid portion.   1.  Left anterior descending:  The left anterior descending is extensively      calcified.  There is 40-50% narrowing in the proximal mid portion.  The      largest of the diagonal vessels has 50% narrowing in the mid portion.      There is a 70+% extensively calcified area as the vessel crosses the      apex.  There is collaterals to a distal acute marginal vessel off the      right coronary artery.   1.  Intermediate coronary:  There is a moderate size intermediate coronary      with 50% narrowing.  There is also ostial narrowing of this vessel.   1.  Left circumflex:  The left circumflex has 50% ostial narrowing.  There      is 60-80% narrowing in the proximal 3-4  cm of the left circumflex.  This      is an eccentric calcified plaque.  The circumflex continues mainly as a      bifurcating obtuse marginal branch.   1.  Right coronary artery:  The right coronary artery is a moderate size      dominant vessel.  It is heavily calcified.  There is diffuse 30-50%      narrowings throughout the course of the right coronary artery but no      severe focal disease.   IMPRESSION:  1.  Well preserved global left ventricular function with inferior apical      hypokinesia.  2.  Extensive coronary calcification with diffuse 30-50% narrowings in the      right coronary artery, 30-40% left main coronary artery stenosis, 30-40%      proximal LAD disease with 70% distal LAD disease, and moderately severe      disease in a small diagonal vessel, 6080% calcified stenosis in the      proximal left circumflex, and 50% narrowing in a moderate size      intermediate vessel.   DISCUSSION:  While [name] does have extensive coronary artery disease, it is  felt to be moderately  severe at this point in time.  The most severe lesion  is in the proximal left circumflex and it does appear to be eccentric and of  differing levels of severity depending upon the angulation of the view.  He  has extensive calcification of his vessels and it is felt that we are best  to continue to treat him medically at this point in time.  I doubt that his  severe rest pain is related to this moderately severe coronary artery  disease.      SNT/MEDQ  D:  07/08/2004  T:  07/08/2004  Job:  [id number]   cc:   Doreen Beam  [address]  [place]  [place] [address]  Fax: [phone]   P. Liliane Bade, M.D.  [address]  [place]  [place] [address]   Payton Doughty, M.D.  [address]  [place]  [place] [address]  Fax: [phone]

## 2010-09-04 NOTE — H&P (Signed)
[place]  Patient:    [name], [name]                        MRN: [id number] Adm. Date:  [id number] Attending:  Eleanora Neighbor CC:         Colleen Can. Deborah Chalk, M.D.   History and Physical  REASON FOR ADMISSION:  Nausea, vomiting, hyponatremia, chest discomfort.  SUBJECTIVE:  Patient is 65 years of age and is admitted to the hospital with nausea, vomiting, hyponatremia, and chest discomfort.  He had peripheral vascular angioplasty by Dr. Bud Face on February 18, 2000.  Patient states that after having any test where he receives dye, that he gets hyponatremic. He came to the emergency room at [facility] today complaining of nausea, vomiting, and chest pain.  He was seen by Dr. Doyne Keel, who asked that we accept him in transfer because he was "concerned about the patients chest pain."  Patient does have a history of coronary artery disease.  It was not mentioned, however, that the patient sodium was 120.  His wife states that he has been nauseated and started vomiting last evening.  Since the vomiting started, his chest pain has been more severe.  In specifically asking about the chest pain, it is left upper parasternal.  It has been present off and on for five to six years.  Dr. Deborah Chalk has never been able to find the etiology of the chest discomfort.  It hurts through to the patients back.  The patient is unable to distinguish between this pain and pain that has actually been associated with his heart.  The wife seems to believe that the pain he is having today is his chronic pain and nothing different.  He has been having claudication in the right lower extremity.  Had angioplasty performed by Dr. Madilyn Fireman on Thursday.  Did well on Friday, but then, starting last night, began feeling ill.  The wife states that every time he has gotten dye, if he stays in the hospital and gets fluid after the procedure, then his sodium gets  low.  ALLERGIES:  None.  HABITS:  Smoked cigarettes until 1991.  Used to drink alcohol heavy, but discontinued drinking in his mid 30s.  MEDICATIONS:  1. Imdur 60 mg per day.  2. Guaifenesin/Tenex 2 mg per day.  3. Aspirin 81 mg per day.  4. Tegretol 200 mg q.a.m., 300 mg at 12 p.m., 300 mg at 6 p.m., and 200 mg     at bedtime.  5. Protonix 40 mg per day.  6. Glucotrol XL 5 mg per day.  7. Procardia XL 90 mg per day.  8. Synthroid 0.125 mg per day.  9. Lipitor 20 mg per day. 10. Depakote 500 mg t.i.d. and 1000 mg at bedtime.  SIGNIFICANT PAST MEDICAL PROBLEMS:  1. Seizure disorder since age 89.  2. Hypertension since 41.  3. Diabetes.  4. Gastroesophageal reflux disease.  5. History of coronary artery disease status post several angioplasties and     also history of MI.  Patient is vague on this history.  6. History of kidney stones.  7. Peripheral vascular disease with claudication, right leg greater than     left, status post PTCA recently by Dr. Bud Face.  FAMILY HISTORY:  Noncontributory.  REVIEW OF SYSTEMS:  Unable to give a history.  The patients wife has noted some petechial/ecchymotic lesions on his face within the past 24 hours.  He has not had chills, fever, cough, hematuria, abdominal discomfort.  He has had some shortness of breath.  There has been no significant pain in his lower extremities.  He has not had recent seizures.  OBJECTIVE:  VITAL SIGNS:  Blood pressure 190/90, heart rate 76.  SKIN:  Does reveal petechial/ecchymotic appearing lesion over/involving the left lid, also on the skin of the forehead, and in the cheek regions of his face.  HEENT:  Reveals pupils that are equally reactive.  There is a suggestion of disconjugate gaze.  CHEST:  Reveals right basilar rales, no wheezing.  CARDIAC:  Positive S4.  There is left parasternal tenderness, no murmur, no click.  ABDOMEN:  Soft.  Liver and spleen are not palpable.  Bowel sounds are  normal.  EXTREMITIES:  Reveal no edema, decreased pulse right femoral, absent pulse right foot.  The right foot is warm and equal in temperature to the left foot. Pedal pulse in the left foot in 1-2+.  NEUROLOGIC:  Patient is somewhat lethargic, easily arousable, no focal deficit other than the suggestion of disconjugate gaze.  LABORATORY DATA:  Done at [facility] reveals a serum sodium of 121, hemoglobin 13.6, BUN 14, creatinine 0.6.  EKG reveals an old inferior infarction, nonspecific ST-T abnormality.  ASSESSMENT: 1. Hyponatremia, uncertain etiology.  Rule out SIADH.  Rule out other. 2. Nausea and vomiting, possibly secondary to hyponatremia. 3. Chest pain, chronic left parasternal.  Based upon the history given by the    wife, I do not believe this is an acute coronary syndrome, but rather an    exacerbation of the patients chronic left chest pain aggravated by    retching associated with nausea and vomiting. 4. Coronary atherosclerotic heart disease with history of prior coronary    angioplasty and myocardial infarctions. 5. History of seizure disorder.  PLAN: 1. Admit. 2. Fluid restriction. 3. Urine and serum sodium and osmolality. 4. PA and lateral chest x-ray. 5. Serial enzymes, rule out infarction. 6. Continue usual home medications. 7. TSH. DD:  02/21/00 TD:  02/22/00 Job: [id number]

## 2010-09-04 NOTE — H&P (Signed)
NAMEMARIAN, [name] NO.:  [id number]   MEDICAL RECORD NO.:  [id number]          PATIENT TYPE:  INP   LOCATION:  2012                         FACILITY:  [facility]   PHYSICIAN:  Corky Crafts, MDDATE OF BIRTH:  March 18, 1946   DATE OF ADMISSION:  06/05/2006  DATE OF DISCHARGE:                              HISTORY & PHYSICAL   REASON FOR ADMISSION:  Chest pain.   HISTORY OF PRESENT ILLNESS:  The patient is a 65 year old male with  known atherosclerosis including coronary artery disease and peripheral  vascular disease. He also had a prior CVA and has a seizure disorder.  He has had chest pain on and off for the past several days.  He went to  [facility] early Saturday morning.  He was to be admitted there  and there was some question of abnormal cardiac enzymes.  He had been  using Nitroglycerin.  He left AMA at that time.  He had more pain  intermittently since then and his wife brought him to [facility] because  his cardiologist is Dr. Deborah Chalk.   PAST MEDICAL HISTORY:  1. Coronary artery disease with several angioplasties in the past.      Last catheterization was in 2006.  2. CVA in 2004.  3. Peripheral vascular disease.  4. Diabetes.  5. Hypertension.  6. Seizure disorder.  7. GERD.  8. Colon cancer.   PAST SURGICAL HISTORY:  1. Cholecystectomy.  2. Left shoulder surgery.  3. Tonsillectomy.  4. Colon surgery.  5. Bilateral fem pop bypasses presumably.   ALLERGIES:  MORPHINE.   MEDICATIONS:  1. Depakote 1 gram in the morning, 500 mg at noon, 500 mg at dinner      time, and 1 gram nightly.  2. Tegretol 200 mg in the morning, at lunch, and at dinner, 400 mg at      bedtime.  3. Procardia XL 90 mg daily.  4. Lopid 600 mg b.i.d.  5. Imdur 120 mg b.i.d.  6. Lopressor 100 mg daily.  7. Celexa 20 mg daily.  8. Synthroid 0.175 mg daily.  9. Altace 10 mg daily.  10.Glucotrol 5 mg daily.  11.Zocor 80 mg daily.  12.Prilosec 40 mg daily.  13.Percocet p.r.n.  14.Tenex.   SOCIAL HISTORY:  The patient quit smoking 16 years ago.  He lives with  his wife.   FAMILY HISTORY:  Noncontributory.   REVIEW OF SYSTEMS:  Significant for numbness on the right side of his  neck.  It has been a chronic problem.  He is also having chest pain.  He  denies any shortness of breath, orthopnea, PND.  He has not had  lightheadedness or syncope.  He denies palpitations.  No nausea or  vomiting.  All other systems negative.   PHYSICAL EXAMINATION:  VITAL SIGNS:  Blood pressure 148/82, pulse 66.  GENERAL:  The patient is awake and alert.  HEAD:  Normocephalic, atraumatic.  EYE:  Extraocular movements are intact.  NECK:  Well-developed left scar.  No JVD.  CARDIOVASCULAR:  Regular rate and rhythm.  S1, S2.  LUNGS:  Clear to auscultation bilaterally.  ABDOMEN:  Soft, nontender, nondistended.  EXTREMITIES:  Show trace edema.  Scar at the left shoulder.  Legs scars  consistent with bilateral fem pop bypass.  SKIN:  No rash.  PSYCH:  Normal mood and affect.   DIAGNOSTIC DATA:  EKG shows normal sinus rhythm with diffuse T wave  inversions and nonspecific ST-T wave changes.   LABORATORY DATA:  Shows troponin negative x2.  INR 1, creatinine 0.5.   ASSESSMENT:  A 65 year old with coronary artery disease and now with  unstable angina.   PLAN:  1. Cardiac.  Continue aspirin, Nitroglycerin, and Lovenox.  Continue      to rule out MI with enzymes.  We will keep n.p.o. past midnight for      cath.  2. Continue Zocor and Lopid for lipids.  3. Sliding scale insulin for diabetes along with Glucotrol.  4. Synthroid for hypothyroidism.  5. Altace and metoprolol for blood pressure control.  6. Dr. Deborah Chalk will follow the patient.      Corky Crafts, MD  Electronically Signed     JSV/MEDQ  D:  06/05/2006  T:  06/06/2006  Job:  [id number]

## 2010-09-04 NOTE — H&P (Signed)
NAME:  [name], [name]                           ACCOUNT NO.:  [id number]   MEDICAL RECORD NO.:  [id number]                   PATIENT TYPE:  INP   LOCATION:  3113                                 FACILITY:  [facility]   PHYSICIAN:  Melvyn Novas, M.D.               DATE OF BIRTH:  1945/11/12   DATE OF ADMISSION:  04/17/2003  DATE OF DISCHARGE:                                HISTORY & PHYSICAL   PRIMARY CARE PHYSICIAN:  Doreen Beam, M.D.   HISTORY OF PRESENT ILLNESS:  [name] is admitted to the Stroke  M.D. service today with MR# [id number], date of birth Mar 10, 1946, age 65.  He is a multi-morbid, obese 65 year old right-handed Caucasian male who was  transferred from [facility] in [place] after his primary care physician  diagnosed him with a stroke yesterday by CT scan.  He was worked up for non  cardiac chest pain, dysarthria and confusion and those were the main  complaints at admission that seemed to have preceded his admission for at  least 36 hours.  According to the wife, the patient never had any  hemiparesis but she was concerned that he remained confused and explained to  herself that he might be postictal since he has a known seizure disorder.  However his mental status did not clear over the day and she became  concerned and presented with him to the [facility] ER.  There he was found to  be unable to follow verbal commands and according to Dr. Sherril Croon had a mild  left hemiparesis which the wife here is not aware of.  He also was worked up  for his chest pain as he has been multiple times prior.  No cardiac enzyme  elevation or acute EKG changes were documented and it was therefore  attributed to a musculoskeletal problem.  He was transferred here at 3100  for acute MCA stroke work up in the right hemisphere.  The admission note  from his [facility] doctor's noted that he had not yet had an MRI or MRA, only a  CT scan of the head and that no EEG was yet obtained.   Carbamazepine and  valproic acid levels were in the therapeutic range.  Again, EKG and cardiac  enzymes showed no abnormality.   PAST MEDICAL HISTORY:  1. Peripheral vascular disease status post multiple femoral-popliteal     angioplasties and balloon dilatation.  2. Left shoulder surgery.  3. Cholecystectomy.  4. T&A.  5. Coronary artery disease status post myocardial infarction x4.  6. Diabetes mellitus.  7. Hypertension.  8. Seizure disorder since childhood.  9. Nephrolithiasis for which he also had surgeries in the past.  10.      Gastroesophageal reflux disease.   MEDICATIONS:  1. Depakote 500 mg t.i.d. and seldom at night.  2. Carbamazepine 400 mg at lunch and night, 200 mg at breakfast and dinner.  3. Isorbid dinitrate 120 mg two daily.  4. Nifedipine 90 mg p.o. daily.  5. Guaifenesin 2 mg daily.  6. Protonix 40 mg daily.  7. Lipitor 40 mg daily.  8. Glucotrol XL 50 mg q.a.m.  9. Altace 10 mg p.o. b.i.d.  10.      Synthroid 0.175 mg.  11.      Metoprolol 50 mg p.o. b.i.d.  12.      Tylox p.r.n.  13.      Phenergan p.r.n.   PHYSICAL EXAMINATION:  VITAL SIGNS:  The patient has an estimated weight of  230 pounds at a height of 5 feet, 6 inches.  Blood pressure is 150/89, heart  rate is 55, respiratory rate is variable.  The apnea is evident as the  patient drifts into sleep between rates of 0 to 12 in a one minute sleep  cycle.  Temperature is 97.8.  LUNGS:  Clear to auscultation.  HEART:  Regular rate and rhythm, no edema but decreased peripheral pedis  pulses, absent on the left, barely palpable on the right.  Bilateral carotid  pulses not palpable.  I cannot detect a bruit but the patient is morbidly  obese and does not stop breathing while I examine him.  NECK:  Supple.  HEENT:  Mucous membranes well perfused.  No tongue bite. Anicteric sclerae.  No cilial injection.  MENTAL STATUS:  The patient appears somnolent, disoriented.  He is not able  to follow verbal  commands but can mimic demonstrated motor actions.  He is  not able to follow multi-step instructions, for example, even after being  demonstrated how to, unable to touch his nose with his finger tips left and  right and instead touches his fingers left and right together.  He is unable  to distinguish left from right, cannot close his eyes when spoken to or  asked by written commands and cannot name objects, neither can he repeat in  intact sentences.  He was also not able to follow the eye movement commands  and insists that he always sees double in any horizontal gaze direction.  Cranial nerves - the pupil on the left is larger than on the right and less  reactive to light.  It is not dis- rounded however.  There is papilledema,  no nicking, no pallor.  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  The visual field deficit appears homonymous on the bilateral  right.  There is a right-sided nystagmus with a fast gaze component  horizontally to the right and psychotic eye movements when he follows a  moving object into the right visual field while he can follow with a smooth  pursuit to the left.  Vertical gaze is intact, there is no vertical  nystagmus.  Facial symmetry appears intact.  Sensory appears intact.  Also  the patient is cooperativity is questionable.  Tongue and uvula are midline  without any drift and there is an intact gag.   MOTOR EXAM:  Equal grip strength, equal strength, tone and mass. He can  extend all extremities, lift both legs off the mattress for 10 seconds or  longer, and has no up going toe.  Plantar stimulation shows inert response.  Deep tendon reflexes are decreased throughout the upper extremities, absent  throughout the lower extremities.   SENSORY:  Decreased touch, pin prick and vibration response in both lower  feet probably due to neuropathy.  The patient feels pain on the left side of chest,  arm and body when touched, according to his wife  this is a chronic  finding for many years.   COORDINATION:  Finger-to-nose bilaterally slow, no pronator drift.  The  patient again is not able to follow instructions and mimics motor commands  which raises the question of aphasia rather than a dysarthria.  Gait and  station deferred.   ASSESSMENT:  The patient was supposedly diagnosed with a right middle  cerebral artery stroke which could explain his left and right agnosia, his  inability to follow motor commands and what might be a form of hemi-  extinction, however there are some brain stem symptoms here in form of  diplopia, dysarthria, and he has developed central apnea during the  examination whenever he fell asleep which does fit rather with a brain stem  event.  He also could still be postictal or have subclinical seizures.   PLAN:  Includes an MRI with MRA, intracranial and extracranial vessels in  the neck and skull.  An EEG, and admission to 3100 with neuro checks and  BiPAP evaluation and titration by respiratory therapy.  The patient will be  admitted to the stroke M.D. service but for my day on call I will may be  reached at [phone] from tomorrow on the stroke pager, [phone] will be the  adequate pager for the following physician.                                                Melvyn Novas, M.D.    CD/MEDQ  D:  04/17/2003  T:  04/17/2003  Job:  [id number]

## 2010-09-04 NOTE — Op Note (Signed)
NAMEGLENDEN, [name]                 ACCOUNT NO.:  [id number]   MEDICAL RECORD NO.:  [id number]          PATIENT TYPE:  INP   LOCATION:  2021                         FACILITY:  [facility]   PHYSICIAN:  Salvatore Decent. Cornelius Moras, M.D. DATE OF BIRTH:  09-28-1945   DATE OF PROCEDURE:  06/29/2006  DATE OF DISCHARGE:                               OPERATIVE REPORT   PREOPERATIVE DIAGNOSIS:  Left forearm radial artery harvest wound  hematoma and infection.   POSTOPERATIVE DIAGNOSIS:  Left forearm radial artery harvest wound  hematoma and infection.   PROCEDURE:  Incision and drainage of left forearm wound.   SURGEON:  Dr. Purcell Nails.   ANESTHESIA:  Monitored anesthesia care with light intravenous sedation.   HISTORY OF PRESENT ILLNESS:  The patient is a 65 year old male with  diabetes who underwent coronary artery bypass grafting by Dr. Evelene Croon in February 2008.  Bilateral radial artery harvest and  procurement was performed at the time of that procedure.  The patient  has been readmitted to the hospital with hematoma that appears to be  infected involving the deep subcutaneous tissues of the left forearm  radial artery harvest wound.  The patient is now brought to the  operating room for surgical debridement to further facilitate recovery  and healing.  The patient and his wife have been counseled regarding the  indications, risks, and potential benefits of surgery.  They desire to  proceed as described.   OPERATIVE NOTE IN DETAIL:  The patient is brought to the operating room  on the above-mentioned date and placed in the supine position on the  operating table with left arm extended.  The entire left forearm and  upper arm are prepared with Betadine solution and draped in sterile  manner.  The existing open wound is carefully inspected.  The majority  of the previous hematoma has already been evacuated and the wound has  improved in comparison with its appearance at the time of  initial  presentation on June 27, 2006.  All devitalized tissue is excised  sharply including some devitalized tissue in the base of the mid portion  of the wound as well as some necrotic skin edges along the portions of  the wound which have not been opened.  The wound is irrigated using a  pulse lavage saline irrigation device.  Any residual hematoma in the  wound base is evacuated.  Satisfactory hemostasis is ascertained.  The  wound is packed using saline moistened sterile cotton gauze and  subsequently wrapped with sterile Kerlix gauze.  The patient tolerated  the procedure well and is transported to the post anesthesia care unit  stable condition.  There are no intraoperative complications.      Salvatore Decent. Cornelius Moras, M.D.  Electronically Signed     CHO/MEDQ  D:  06/29/2006  T:  07/01/2006  Job:  [id number]

## 2010-09-04 NOTE — Consult Note (Signed)
NAME:  [name], [name]                           ACCOUNT NO.:  [id number]   MEDICAL RECORD NO.:  [id number]                   PATIENT TYPE:  INP   LOCATION:  3011                                 FACILITY:  [facility]   PHYSICIAN:  Balinda Quails, M.D.                 DATE OF BIRTH:  04-Mar-1946   DATE OF CONSULTATION:  04/19/2003  DATE OF DISCHARGE:                                   CONSULTATION   REFERRING PHYSICIAN:  Melvyn Novas, M.D.   PRIMARY CARE PHYSICIAN:  Doreen Beam, M.D.   REASON FOR CONSULTATION:  Left posterior temporoparietal cerebrovascular  accident.   SUMMARY OF HISTORY:  Mr. Leeroy Lovings is a 65 year old male well known to me  with a history of severe peripheral vascular disease associated with  atherosclerotic vascular disease.  He was admitted to [facility] on  April 17, 2003, for evaluation and management of acute neurologic  changes.  He was transferred from [facility] after a CT scan showed a  left posterior temporoparietal stroke.  He has an aphasia more severely  effecting his expressive rather than receptive component.  He denies any  weakness at this time.  He has no headache.   PAST MEDICAL HISTORY:  1. Peripheral vascular disease, status post lower extremity     revascularization.  2. Left shoulder surgery.  3. Cholecystectomy.  4. T&A.  5. Coronary artery disease.  6. Diabetes mellitus.  7. Hypertension.  8. Seizure disorder.  9. Nephrolithiasis.  10.      Gastrointestinal reflux.   MEDICATIONS:  1. Depakote 500 mg p.o. t.i.d.  2. Carbamazepine 400 mg at lunch and q.h.s. and 200 mg with breakfast and     dinner.  3. Isosorbide dinitrate 120 mg p.o. b.i.d.  4. Nifedipine 90 mg p.o. daily.  5. Guaifenesin 2 mg p.o. daily.  6. Protonix 40 mg p.o. daily.  7. Lipitor 40 mg p.o. daily.  8. Glucotrol XL 50 mg p.o. q.a.m.  9. Altace 10 mg p.o. b.i.d.  10.      Synthroid 0.175 mg p.o. daily.  11.      Metoprolol 50 mg p.o. b.i.d.  12.      Tylox p.r.n.  13.      Phenergan p.r.n.   ALLERGIES:  MORPHINE.   FAMILY HISTORY:  Noncontributory.   REVIEW OF SYSTEMS:  The patient denies any recent weight loss.  He has  chronic intermittent chest pain.  He does have shortness of breath with  exertion.  Denies recent cough or sputum production.  No anorexia, nausea,  vomiting, constipation, or diarrhea.  No recent flank pain, hematuria,  dysuria, or frequency.   PHYSICAL EXAMINATION:  GENERAL APPEARANCE:  An obese 65 year old male who  appears older than his stated age.  He is oriented to person and place.  VITAL SIGNS:  The BP is 150/80, heart rate 55 per minute and  regular,  respirations 16 per minute, and temperature 97.8 degrees.  HEENT:  The mouth and throat are clear.  Normocephalic.  NECK:  Supple.  No thyromegaly or adenopathy.  Bilateral soft carotid bruits  are present.  CHEST:  Air entry equal bilaterally.  No rales or rhonchi.  CARDIOVASCULAR:  Normal heart sounds.  Bilateral carotid bruits.  ABDOMEN:  Obese, soft, and nontender.  No masses or organomegaly.  EXTREMITIES:  Lower extremities with 2+ femoral pulses bilaterally.  The  feet are well perfused.  No peripheral edema.  NEUROLOGIC:  The patient is oriented to person and place.  Cranial nerves  are intact.  Strength equal bilaterally.  He does have an expressive aphasia  and receptive aphasia.   INVESTIGATIONS:  CT angiogram reveals diffuse extracranial cerebrovascular  occlusive disease with anterior and posterior circulation involvement, right  vertebral artery occlusion, calcified disease of left vertebral artery, and  bilateral carotid bifurcation disease with estimated internal carotid artery  stenosis of 80% on the right and 70% on the left.  CT also reveals a  subacute infarction in the left posterior temporoparietal lobe.   IMPRESSION:  A 65 year old male with a history of diffuse atherosclerotic  vascular disease who presents with left  posterior temporoparietal infarction  with aphasia.  Investigation reveals evidence of diffuse extracranial  cerebrovascular occlusive disease with bilateral internal carotid artery  stenosis and vertebral artery disease.   RECOMMENDATIONS:  1. Continue heparin drip.  2. Add aspirin 81 mg p.o. daily.  3. Continue speech therapy.  4. Will review CT angiogram.  5. Consider left carotid endarterectomy.                                               Balinda Quails, M.D.    PGH/MEDQ  D:  04/19/2003  T:  04/19/2003  Job:  [id number]   cc:   Doreen Beam  [address]  [place]  [place] [address]  Fax: [phone]   Melvyn Novas, M.D.  [address]  [address]  [place]  [place] [address]  Fax: [phone]

## 2010-09-04 NOTE — Discharge Summary (Signed)
[name], [name]                 ACCOUNT NO.:  [id number]   MEDICAL RECORD NO.:  [id number]          PATIENT TYPE:  INP   LOCATION:  2011                         FACILITY:  [facility]   PHYSICIAN:  Evelene Croon, M.D.     DATE OF BIRTH:  09/22/1945   DATE OF ADMISSION:  06/05/2006  DATE OF DISCHARGE:  06/17/2006                               DISCHARGE SUMMARY   ADMISSION DIAGNOSIS:  Unstable angina.   DISCHARGE DIAGNOSES:  1. Severe three-vessel coronary artery disease with unstable angina,      status post coronary artery bypass grafting.  2. Patient has a history of coronary artery disease and has been      treated medically in the past.  Last catheterization was in 2006.  3. Cerebrovascular disease, status post stroke in 2004 and 2005.  He      underwent left carotid endarterectomy by Dr. Gery Pray Case for high-      grade left internal carotid artery stenosis in 2005.  4. Peripheral vascular disease, status post left femoral/popliteal      bypass with saphenous vein in 1998 with subsequent right      femoral/popliteal bypass with saphenous vein in 2002.  Underwent      revision of the right femoral/popliteal bypass with Gore-Tex      interposition graft in June 2004.  5. History of diabetes.  6. Hypertension.  7. Gastroesophageal reflux disease.  8. History of seizure disorder.  9. Status post colon resection for colon cancer last August done at      [facility].  Did not require chemotherapy or any other      adjunctive therapy.  10.He is status post cholecystectomy in the 1980s.  11.Status post left shoulder surgery.  12.Status post tonsillectomy and adenoidectomy.   IN-HOSPITAL DIAGNOSES:  1. Acute on chronic anemia.  2. Postoperative volume overload.   PROCEDURE:  On June 10, 2006, the patient underwent a coronary  artery bypass grafting times four with LIMA to the OM, LIMA to the LAD,  and radial to the OD, and radial to the PDA with Dr. Evelene Croon with  bilateral open radial artery harvest.   CONSULTING PHYSICIAN:  On June 06, 2006, Dr. Evelene Croon was  consulted for a cardiothoracic surgery.   HISTORY AND PHYSICAL:  This is a 65 year old male with a history of  diabetes and diffuse vascular disease who has a history of coronary  artery disease in the past year medically.  He presented with a two- to  three-day history of recurrent episodes of substernal chest pain  requiring use of sublingual nitroglycerin and he went to [facility], but left against medical advice.  He had recurrent chest pain  and his wife brought him to [facility] to see Dr. Deborah Chalk.  Initial  enzymes were negative.  His EKG showed infra-lateral EKG changes.  He  underwent cardiac catheterization on June 06, 2006 which showed  severe three-vessel coronary artery disease with diffusely diseased  coronary arteries.  There was extensive coronary calcification, LAD and  proximal irregularities with 50% stenosis.  There was  90% distal  stenosis of the apex.  First diagonal had an 80% ostial stenosis and a  moderate-sized __________.  Left circumflex was occluded proximally.  There was a TIMI-2 flow into the distal left.  Right coronary artery  with 90% mid-vessel stenosis.  There was 99% stenosis in the small acute  marginal branch.  There was about 30% to 50% stenosis at the crux.  Left  ventricular EF was 40% apical akinesis.  There was no significant MR.  It was best thought that the patient undergo coronary artery bypass  grafting for treatment of his anginal symptoms.  The patient agreed to  proceed and he was scheduled for June 10, 2006.   Prior to surgery, the patient did not have any further episodes of chest  pain.  He did remain hemodynamically stable.  Hemoglobin of 10,  hematocrit 30.  The patient was maintained on beta blocker, Tegretol and  valproic acid prior to surgery.   On June 10, 2006, the patient underwent CABG times four  by Dr. Evelene Croon without any complications.  Postoperatively, the patient  progressed as expected.  Hospital stay has remained uneventful.  The  patient was extubated the evening of postoperative day #1.  The patient  did have some atrial fibrillation on post-op day #1 and he was started  on amiodarone IV.  He did convert to normal sinus rhythm and he was  converted to p.o. on June 12, 2006.  The patient did have severe  nausea on June 14, 2006, with many episodes of vomiting.  His  amiodarone was discontinued.  The patient's nausea did then improve.  The patient has remained in normal sinus rhythm.  His blood pressure is  well-controlled in the 140s/80s.  The patient was on Procardia and  Altace and Lopressor.   The patient was extubated without any difficulty.  He was able to be  weaned from his oxygen.  At the time of discharge, he was 97% O2 sat on  room air.  The patient had a history of diabetes mellitus.  His CBGs  have been well-controlled at 78/66/103.  He was maintained on a sliding  scale of insulin.  The patient's Lantus was held due to hypoglycemia  since his vomiting episodes on post-op day #4.   The patient was started on Imdur on postop day #2 for his radial artery  graft.  The patient did have some chronic anemia.  Postoperatively, the  patient's hemoglobin remained 9.4.  He did not receive any blood  transfusions.  Renal function remained intact with BUN of 10 and a  creatinine of 0.5.  Due to the patient's nausea, he was put n.p.o.  He  did have a bowel movement on post-op day #4 and he was given IV fluids.  He was started on clear liquids on  June 15, 2006, and that was  increased as tolerated.   The patient was ambulating with cardiac rehab with a somewhat unsteady  gait due to his history of CVA.  The patient did have a rolling walker.  Occupational therapy evaluated the patient.  If he goes home, he will need 24-hour supervision or he can go to a  nursing home.  He is not a  candidate for inpatient rehab.   The wife decided she wanted to take the patient home and she would be  able to provide 24-hour care.  The patient remained medically stable and  was able to be discharged home on June 17, 2006, in stable condition  with home health physical therapy, occupational therapy brought in.   MEDICATIONS:  1. Glipizide 5 mg p.o. daily.  2. Nexium 80 mg p.o. daily.  3. Procardia XL 90 mg p.o. daily.  4. Aspirin 325 mg p.o. daily.  5. Lopressor 25 mg p.o. b.i.d.  6. Altace 10 mg p.o. b.i.d.  7. Lipitor 40 mg p.o. q.h.s.  8. Oxycodone 5 mg one to two tabs p.o. p.r.n.  9. Celexa 20 mg p.o. daily.  10.Prilosec 20 mg p.o. daily.  11.Synthroid 150 mcg p.o. daily.  12.Lopid 600 mg p.o. b.i.d.  13.Depakote 500 mg with lunch and dinner.  14.Depakote 1000 mg p.o. b.i.d.  15.Tegretol 200 mg p.o. t.i.d.  16.Tegretol 400 mg p.o. q.h.s.  17.Xanax as needed.   DISCHARGE INSTRUCTIONS:  The patient is instructed to follow a low-fat,  low-salt, diabetic diet.  No driving or lifting over a few ounces.  The  patient is to ambulate three or four times daily and increase activity  as tolerated.  He may shower.  Clean his incisions with soap and water.  Use breathing exercises.  He is to follow up if any wound problems shall  arise such as incisional erythema, drainage, temperature greater than  101.5.   FOLLOW UP:  The patient will follow-up with Dr. Laneta Simmers in three weeks.  The office will contact him and tell him the appointment.  He is to  follow up with Conni Elliot in two weeks and is to call to arrange an  appointment.      Constance Holster, [place]      Evelene Croon, M.D.  Electronically Signed    JMW/MEDQ  D:  07/19/2006  T:  07/19/2006  Job:  [id number]   cc:   [facility]

## 2010-09-04 NOTE — Discharge Summary (Signed)
[name]. [facility]  Patient:    [name], [name]                        MRN: [id number] Adm. Date:  [id number] Disc. Date: [id number] Attending:  Cornell Barman Dictator:   Jamelle Rushing, P.A. CC:         Colleen Can. Deborah Chalk, M.D.   Discharge Summary  ADMISSION DIAGNOSES: 1. Tendonitis and impingement syndrome, left shoulder. 2. Coronary artery disease with an ejection fraction of 47%. 3. History of myocardial infarction x 4. 4. Peripheral vascular disease with lower extremity grafts and bypasses. 5. Seizure disorder. 6. Diabetes mellitus. 7. Hypertension.  DISCHARGE DIAGNOSES: 1. Left shoulder distal clavicle resection and acromioplasty. 2. Nausea and vomiting. 3. Coronary artery disease with an ejection fraction of 47%. 4. History of myocardial infarction x 4. 5. Peripheral vascular disease with lower extremity grafts and bypass. 6. Seizure disorder. 7. Diabetes mellitus, type 2. 8. Hypertension.  HISTORY OF PRESENT ILLNESS:  The patient is a 65 year old male with a history of left shoulder arthroscopy and open tendon debridement in 1996.  The patient had a good improvement of his shoulder until approximately four to five months ago when he had significant decreased range of motion and increased pain with any range of motion.  The patient is now unable to raise his arm above his shoulder without any discomfort.  The patient states that the pain is worsening with any mechanical efforts of the arm, such as holding a glass out in front of him.  The patient denies any new injury.  The patient is to have as scheduled an open subacromial decompression and distal clavicle resection by Thereasa Distance A. Chaney Malling, M.D., at [facility]. [facility] due to his complicated medical history.  ALLERGIES:  No known drug allergies.  CURRENT MEDICATIONS:  1. Imdur 60 mg p.o. at noon.  2. Protonix 40 mg p.o. q.a.m.  3. Procardia XL 90 mg p.o. q.p.m.  4.  Tenex 2 mg p.o. q.p.m.  5. Lopressor 75 mg p.o. b.i.d.  6. Lipitor 20 mg p.o. q.p.m.  7. Glucotrol XL 5 mg p.o. q.p.m.  8. Synthroid 0.175 mg p.o. q.a.m.  9. Depakote 500 mg at 8 a.m., 500 mg at noon, 500 mg at 5 p.m., and 1000 mg     p.o. q.h.s. 10. Tegretol 300 mg at 8 a.m., 200 mg at noon, 300 mg at 5 p.m., and 200 mg     q.h.s.  SURGICAL PROCEDURES:  On January 05, 2000, the patient was taken to the OR by Thereasa Distance A. Chaney Malling, M.D.  The patient underwent a diagnostic arthroscopy of his left shoulder with acromioplasty.  The patient then underwent an open distal clavicle resection.  COMPLICATIONS:  None.  HOSPITAL COURSE:  On January 05, 2000, the patient was admitted to [facility] under the care of [place] A. Chaney Malling, M.D.  The patient was taken to the OR where a left shoulder arthroscopy and acromioplasty were performed.  The patient then underwent an open distal clavicle resection.  The patient had no complications and was transferred to the recovery room and then to the orthopedic floor in good condition.  On postoperative day #1, the patient was feeling okay, but had periods of significant episodes of emesis after breakfast.  The patient had continued episodes of emesis throughout the day, so his IV was kept in place and he was kept comfortable for one more day.  On postoperative day #2, the patient was a lot more comfort.  He was afebrile.  His left upper extremity was neuromotor vascularly intact.  He was ready to go home.  DISPOSITION:  The patient was discharged to home with the following instructions.  DISCHARGE MEDICATIONS:  1. Imdur 60 mg p.o. at noon.  2. Protonix 40 mg p.o. q.a.m.  3. Procardia XL 90 mg p.o. q.p.m.  4. Tenex 2 mg p.o. q.p.m.  5. Lopressor 75 mg p.o. b.i.d.  6. Lipitor 20 mg p.o. q.p.m.  7. Glucotrol XL 5 mg p.o. q.p.m.  8. Synthroid 0.175 mg p.o. q.a.m.  9. Depakote 500 mg at 8 a.m., 500 mg at noon, 500 mg at 5  p.m., and 1000 mg     p.o. q.h.s. 10. Tegretol 300 mg at 8 a.m., 200 mg at noon, 300 mg at 5 p.m., and 200 mg     q.h.s. 11. Percocet 5 mg one or two tablets every four to six hours for pain if     needed.  ACTIVITY:  The patient is to keep the left arm in sling.  WOUND CARE:  The patient is to keep the dressing clean and dry.  If the patient notes any increased pain or swelling, he is to remove the dressing and examine the wound.  If there is any foul discharge, temperature, or redness around the wound, he is to call Thereasa Distance A. Chaney Malling, M.D.  FOLLOW-UP:  The patient is to call [phone] for a follow-up appointment with Thereasa Distance A. Chaney Malling, M.D., in one week.  CONDITION ON DISCHARGE:  Improved. DD:  01/07/00 TD:  01/08/00 Job: [id number]

## 2010-09-04 NOTE — Discharge Summary (Signed)
[place]. [facility]  Patient:    [name], [name]                        MRN: [id number] Adm. Date:  [id number] Disc. Date: [id number] Attending:  Eleanora Neighbor Dictator:   Jennet Maduro. Earl Gala, R.N., A.N.P.-C. CC:         Oleh Genin, M.D., [place], [place]                           Discharge Summary  DISCHARGE DIAGNOSES:  1. Prolonged episode of chest pain, negative for myocardial infarction, with     subsequent cardiac catheterization showing well preserved left ventricular     function with mild apical hypokinesis, extensive coronary calcification,     and mild to moderate diffuse coronary artery disease with severe stenosis     in the acute marginal vessel of the right coronary with 30 to 50%     narrowing throughout the right coronary artery, 50% narrowing in the     proximal intermediate, 30 to 40% narrowing in the left anterior     descending, and 20 to 30% narrowing in the left circumflex, to be     continued with medical management.  2. Hyponatremia, currently resolving.  3. Atherosclerotic cardiovascular disease with previous history of     angioplasty to the left circumflex and right coronary artery on two     occasions in 1993.  4. History of a seizure disorder.  5. Previous hyponatremia felt to be due to Tegretol.  6. Hypertension, currently controlled.  7. Hypothyroidism, currently with thyroid stimulating hormone of 10.882 with     subsequent increase in Synthroid.  8. Non-insulin-dependent diabetes.  9. Chronic pain syndrome on chronic Tylox.  HISTORY OF PRESENT ILLNESS:  [name] is a 65 year old male who has multiple medical problems and known atherosclerotic cardiovascular disease.  He presents to the emergency room after a prolonged episode of refractory chest pain.  He was subsequently admitted and place don IV nitroglycerin and heparin and subsequent narcotics for relief.  He was subsequently admitted for further evaluation.  Please see  dictated history and physical for further patient presentation and profile.  LABORATORY DATA ON ADMISSION:  EKG showed no acute changes.  Chest x-ray showed some scarring at the bases but with no acute abnormality. Chemistries revealed sodium 131, potassium 4.2, chloride 99, CO2 24, BUN 13, creatinine 0.7, glucose 151, hematocrit 36, white count 5.6, platelets 226.  HOSPITAL COURSE:  Patient was admitted.  He was placed on IV nitroglycerin and heparin and received p.r.n. morphine for pain relief.  We proceeded on with cardiac catheterization on the morning of admission due to ongoing chest discomfort which was unable to be abated.  The findings are as described above.  It was felt that [name] continued to be managed medically and that he is at a low risk from a pure cardiac standpoint at this point in time.  He was subsequently transferred to telemetry where he was continued on IV nitroglycerin.  The following day, sodium was noted to have dropped to 120. Thyroid studies were also repeated.  A repeat TSH was down to 7.5 initially from 10.882.  A free T4 was 0.82.  At that point in time, we continued to follow [name] along medically.  His hyponatremia was treated with IV fluids as well as fluid restriction.  He was watched over the  course of the weekend and was demonstrating adequate pain control.  All cardiac enzymes were negative.  Today, on Sep 07, 1999, he is continuing to do well.  He feels that his chest pain syndrome is currently at baseline and he is felt to be stable for discharge today.  Discharge labs show a sodium of 133, potassium 4.2, chloride 100, CO2 27, BUN 18, creatinine 0.4, and glucose 94.  DISCHARGE CONDITION:  Stable.  DISCHARGE MEDICATIONS:  1. Add Imdur 60 mg a day.  2. Increase his Synthroid to 175 mcg a day.  3. Resume his Tenex 2 mg a day.  4. Depakote 500 mg three times a day, and two tablets at bedtime.  5. Glucotrol XL 5 mg a day.  6. Lipitor 10  q.d.  7. Lopressor 75 b.i.d.  8. Protonix 40 mg a day.  9. Aspirin daily. 10. Tegretol 200 mg five times a day. 11. Procardia XL 90 mg a day. 12. Nitroglycerin p.r.n. 13. Tylox as before.  DIET:  Low fat, diabetic.  ACTIVITY:  As tolerated.  FOLLOW-UP:  Will need to be in our office in approximately two to three weeks and he is asked to call the office to make that appointment. DD:  09/07/99 TD:  09/09/99 Job: [id number]

## 2010-09-04 NOTE — Discharge Summary (Signed)
NAME:  [name], [name]                           ACCOUNT NO.:  [id number]   MEDICAL RECORD NO.:  [id number]                   PATIENT TYPE:  INP   LOCATION:  2032                                 FACILITY:  [facility]   PHYSICIAN:  Balinda Quails, M.D.                 DATE OF BIRTH:  1945/06/11   DATE OF ADMISSION:  09/19/2002  DATE OF DISCHARGE:  09/23/2002                                 DISCHARGE SUMMARY   PRIMARY ADMITTING DIAGNOSIS:  Right leg pain.   ADDITIONAL/DISCHARGE DIAGNOSES:  1. Poorly functioning right superficial-femoral-to-popliteal bypass graft.  2. History of peripheral vascular disease, status post bilateral superficial-     femoral-to-popliteal bypass grafts in the past.  3. Coronary artery disease, status post myocardial infarction x4.  4. History of seizure disorder.  5. Hypothyroidism.  6. Hypercholesterolemia.  7. Hiatal hernia/gastroesophageal reflux disease.  8. Type 2 non-insulin-dependent diabetes mellitus.  9. Hypertension.  10.      History of tobacco use.  11.      History of alcohol use.   PROCEDURE PERFORMED:  Revision of right superficial-femoral-to-popliteal  bypass graft with 6-mm Gore-Tex interposition graft.   HISTORY:  The patient is a 65 year old white male with a known history of  peripheral vascular disease.  He has previously undergone bilateral  superficial-femoral-to-popliteal bypass grafts with saphenous vein.  He  recently returned with symptoms of claudication, stating that he can only  walk one to two blocks, or for about a total of 10 minutes before he  develops cramps in his right leg.  He has had a change in the ankle brachial  index on the right with a drop in pressure.  Recent arteriogram showed a  subtotal occlusion in the right superficial femoral artery proximal to the  takeoff of the graft.  It was recommended at the time that he proceed with  surgical intervention to revise his graft.   HOSPITAL COURSE:  He was admitted on  September 19, 2002 and was taken to the  operating room, where he underwent revision of his SFA-to-popliteal bypass  graft with interposition Gore-Tex graft.  He tolerated the procedure well  and was transferred to the floor in stable condition.  Postoperatively, he  developed fever up to 103.  His white count remained within normal limits at  5.8.  His other labs remained stable.  Chest x-ray and blood cultures were  nonrevealing.  He was noted to have some atelectasis on chest x-ray and was  treated with aggressive pulmonary toilet measures.  His fever resolved and  he was able to be transferred to the floor.  Postoperatively, overall he has  done well.  His ankle brachial index has improved to greater than 1.0 on the  right.  His surgical incision sites are healing well and he has maintained a  good pulse in his graft as well as a palpable popliteal pulse.  His lower  extremity is warm and well-perfused.  He has been ambulating in the halls  without difficulty.  His only issue has been pain control.  He has required  large doses of morphine and Dilaudid, but has slowly been weaned down to  p.o. oxycodone for pain and is tolerating this fairly well.  He is otherwise  stable and it is felt at this time that he may be discharged home.    DISCHARGE MEDICATIONS:  1. Imdur 120 mg daily.  2. Protonix 40 mg daily.  3. Procardia XL 90 mg daily.  4. Metoprolol 50 mg b.i.d.  5. Lipitor 40 mg daily.  6. Glucotrol XL 5 mg daily.  7. Depakote 500 mg daily.  8. Tegretol 100 mg b.i.d. and 200 mg q.i.d.  9. Synthroid 0.175 mg daily.  10.      Altace 10 mg daily.  11.      Xanax 1 mg q.h.s.  12.      Tylox one to two every four hours p.r.n. for pain.   DISCHARGE INSTRUCTIONS:  He is to refrain from driving, heavy lifting or  strenuous activity.  He may continue daily ambulation and use of his  incentive spirometer.  He is asked to shower daily and clean his incisions  with soap and water.  He is asked  to keep a dry gauze dressing in the groin  area.   DISCHARGE FOLLOWUP:  He will return to our office in one week for staple  removal and wound recheck.  He will then follow up with Dr. Balinda Quails in  two weeks for reevaluation and repeated ankle brachial indices.  He is asked  to call if he experiences any problems or has questions in the interim.     Coral Ceo, P.A.                        Balinda Quails, M.D.    GC/MEDQ  D:  09/23/2002  T:  09/23/2002  Job:  [id number]   cc:   Damien Fusi, Dhruv M.D.

## 2010-09-04 NOTE — Cardiovascular Report (Signed)
[place]. [facility]  Patient:    [name], [name]                        MRN: [id number] Proc. Date: 02/02/00 Adm. Date:  [id number] Attending:  Melvenia Needles CC:         Colleen Can. Deborah Chalk, M.D.  Peripheral Vascular Catheterization Lab at [facility]   Cardiac Catheterization  DIAGNOSIS:  Recurrent right lower extremity claudication.  PROCEDURE:  Aortogram with bilateral lower extremity runoff.  ACCESS:  Right common femoral artery, 5 French sheath.  CONTRAST:  190 mL ______ paque (severe generalized debilitation).  COMPLICATIONS:  None apparent.  CLINICAL NOTE:  This is a 65 year old patient who is well known to me with advanced peripheral vascular disease.  He is status post right superficial femoral artery PTA in 1996.  He has undergone left superficial to popliteal bypass with vein graft approximately 2 to 3 years ago.  He presented to the office with recurrent right leg claudication.  He is brought to the [facility] Peripheral Vascular Cath Lab for diagnostic arteriography at this time.  Informed consent obtained.  PROCEDURE NOTE:  The patient brought to the cath lab in stable condition. Placed in supine position.  Both groins prepped and draped in a sterile fashion.  Administered 2 mg of Nubain and 2 mg of Versed intravenously.  Skin and subcutaneous tissue of the left groin were instilled with 1% Xylocaine.  The needle was introduced in the left common femoral artery. There was marked scar tissue in the left groin.  Attempt was made to pass a 5, 6, and 7 dilator over the guide wire, however, these were unsuccessful.  The guide wire was therefore removed from the left groin and pressure placed on the left groin to control bleeding.  Attention was then placed on the right groin.  The skin and subcutaneous tissue were instilled with 1% Xylocaine.  A needle was easily introduced into the right common femoral artery.  A 0.035  J-wire passed through the needle into the abdominal aorta.  A 5 French sheath passed over the guide wire, and the dilator removed.  The sheath flushed with heparin saline solution.  A pigtail catheter was then advanced over the guide wire.  This was positioned in the suprarenal aorta.  Standard AP mid abdominal aortogram obtained.  This revealed single bilateral renal arteries which were widely patent.  Mild atherosclerotic irregularity was noted of the infrarenal aorta without dominant stenosis.  There was some moderate plaque noted at the origin of the common iliac arteries bilaterally without dominant stenosis.  The pigtail catheter was then repositioned at the aortic bifurcation and bilateral lower extremity runoff arteriography obtained.  This revealed the common iliac arteries to be free of significant stenosis bilaterally.  The left internal iliac artery revealed the stenosis at its origin.  The right internal iliac artery was small and atrophic.  There was continuous flow through the external iliac arteries to the common femoral level without significant stenosis.  The right lower extremity revealed moderate atherosclerotic irregularity of the right superficial femoral artery at its origin.  The right profundus femoris artery was patent.  The right superficial artery was patent down to the adductor canal where there was a short approximately 1 cm severe stenosis which was pre-occlusive.  Distal to this there was a moderate irregularity noted of the popliteal artery.  The dominant runoff in the right lower extremity was via  the right posterior tibial and peroneal arteries.  The right anterior tibial artery was small and atrophic and occluded in the mid calf.  Left lower extremity runoff revealed patency of the external iliac artery to the common femoral level.  The left superficial femoral artery was widely patent at its origin.  The left profundus femoris artery revealed a  moderate stenosis at its origin.  There was a patent left superficial femoral popliteal saphenous vein bypass graft without significant stenosis.  The left popliteal artery was patent.  Dominant runoff of the left leg via the left posterior tibial artery.  A small left peroneal artery noted.  The left anterior tibial artery occluded.  This completed the arteriogram procedure.  The patient tolerated this without complication.  Guide wire reinserted, and the pigtail catheter removed.  The patient was transferred to the holding area for removal of right femoral sheath.  FINAL IMPRESSION: 1. Severe short segment pre-occlusive stenosis of the right superficial    femoral artery at the adductor canal. 2. Widely patent left superficial femoral popliteal bypass. 3. Moderate bilateral tibial vessel occlusive disease.  DISPOSITION:  These results have been discussed with the patient and his wife. The patient will be scheduled for elective right superficial femoral PTA. DD:  02/02/00 TD:  02/02/00 Job: [id number]

## 2010-09-04 NOTE — Discharge Summary (Signed)
NAME:  [name], [name]                           ACCOUNT NO.:  [id number]   MEDICAL RECORD NO.:  [id number]                   PATIENT TYPE:  INP   LOCATION:  2005                                 FACILITY:  [facility]   PHYSICIAN:  Melvyn Novas, M.D.               DATE OF BIRTH:  [birth year]   DATE OF ADMISSION:  04/17/2003  DATE OF DISCHARGE:                                 DISCHARGE SUMMARY   [name] was admitted on the 29th as a multimorbid patient with atheromatous  polyvascular disease, peripheral and intracranial disease. This is a 65-year-  old right-handed Caucasian gentleman who was transferred from [facility] in [place] where he was admitted to the care of Dr. Doreen Beam, his  primary care physician. He had diagnosed him with a stroke yesterday CT  scan. He was then worked up for dysarthria, confusion, and these symptoms  might have preceded the admission by as much as 36 hours previous to  presenting to [facility]. According to the patient's wife, there was  no hemiparesis. When we established here a transfer, the patient was  admitted to the 3100 unit for an acute MCA stroke and it turned out that the  patient had a stroke in the left MCA distribution. Further workup by  echocardiogram showed that the patient had some abnormal sinus rhythms  intermittently, but never developed atrial fibrillation here during his  hospitalization on telemetry or in the ICU. An echocardiogram was performed  and showed no evidence of embolism. Carotid Doppler MRA showed significant  carotid stenosis and CVTS service was consulted and performed a carotid  endarterectomy on April 25, 2003. The patient did well after the carotid  endarterectomy. It was healing well. The remaining symptoms of his left MCA  stroke are still expressive aphasia, mild dysphagia, and right-sided hand  clumsiness discoordination, but he does not show gait ataxia or any weakness  in his four extremities. His  vital signs remained stable. Also, he required  frequent potassium supplementation. The patient was followed by ST, PT, OT,  and rehab team, and was found to be fit for discharge by his CVTS physician,  Dr. Liliane Bade, as well. He will continue to follow with his primary care  physician for noncardiac chest pain.  His cardiologist is Dr. Deborah Chalk. He  will continue to follow up for stroke after care with Dr. Delia Heady of  [facility] and hopefully can also follow up with Dr. Pearlean Brownie regarding his history of  seizure disorder which did not play a role in this admission. I told Ms.  Sievers that  I would be happy to take care of the seizure part should any  problems arise, and Dr. Pearlean Brownie and I share the same office phone number.   DIAGNOSES AT DISCHARGE:  1. Left middle cerebral artery stroke.  2. History of atheromatous occlusive disease.  3. Diagnosed left high-grade carotid stenosis  by catheter angiogram on     April 24, 2003; see cardiothoracic surgeon report.  4. MRA/MRI confirmed location of the stroke.  5. Physical therapy, speech therapy, occupational therapy, and     rehabilitation consultation; see chart.   The patient will be discharged on the medications he was admitted with. In  addition, Dr. Pearlean Brownie ordered Neurontin 600 mg t.i.d. for the noncardiac chest  pain in hopes that this will influence the patient's pain medication abuse  positively.   DISCHARGE MEDICATIONS:  1. Aspirin 325 mg once a day.  2. Carbamazepine/Tegretol 200 mg in the morning, 400 mg at nighttime (around     10), 400 mg at lunch, 200 mg at dinner, amounting to a total of 1.2 gm     divided in four doses.  3. Depakote 500 mg t.i.d.  4. Isosorbide dinitrate 120 mg b.i.d.  5. Nifedipine 90 mg p.o. daily.  6. Guaifenesin 2 mg daily.  7. Protonix 40 mg daily.  8. Lipitor 40 mg daily.  9. Glucotrol XL 50 mg in the morning.  10.      Altace 10 mg b.i.d.  11.      Synthroid 0.125 mcg daily.  12.      Metoprolol 50 mg  b.i.d.  13.      Tylox p.r.n.  14.      Phenergan p.r.n.   This discharge note is accompanied by a prescription for outpatient OT and  speech therapy.                                                Melvyn Novas, M.D.    CD/MEDQ  D:  04/27/2003  T:  04/27/2003  Job:  [id number]   cc:   Pramod P. Pearlean Brownie, MD  Fax: [phone]   P. Liliane Bade, M.D.  [address]  [place]  [place] [address]   Doreen Beam  [address]  [place]  [place] [address]  Fax: [phone]   Colleen Can. Deborah Chalk, M.D.  Fax: [phone]

## 2010-09-04 NOTE — Discharge Summary (Signed)
[name], [name] NO.:  [id number]   MEDICAL RECORD NO.:  [id number]          PATIENT TYPE:  INP   LOCATION:  2034                         FACILITY:  [facility]   PHYSICIAN:  Colleen Can. Deborah Chalk, M.D.DATE OF BIRTH:  1945-09-02   DATE OF ADMISSION:  07/07/2004  DATE OF DISCHARGE:  07/12/2004                                 DISCHARGE SUMMARY   PRIMARY DISCHARGE DIAGNOSIS:  Chest pain with subsequent elective cardiac  catheterization showing extensive calcification of the coronary tree with  left main coronary artery with a 50% stenosis, the LAD is heavily calcified  with a 40-50% proximal narrowing.  There is a 70% narrowing into the area of  the diagonal vessel.  The left circumflex has an eccentric 60-80% proximal  narrowing.  The intermediate has a 50-60% narrowing.  The right coronary  artery is diffusely calcified with 50-60% narrowings as well.  The ejection  fraction is 50%.  There is inferior apical hypokinesia.  It is felt that the  patient is not a candidate for revascularization and will need to continue  with medical management.   SECONDARY DISCHARGE DIAGNOSIS:  1.  Extensive history of atherosclerotic cardiovascular disease.  2.  Peripheral vascular disease.  3.  Intracranial vascular disease.  4.  History of left shoulder surgery.  5.  History of cholecystectomy.  6.  History of T&A.  7.  Diabetes mellitus.  8.  Hypertension.  9.  Seizure disorder.  10. Prior history of stroke.  11. History of nephrolithiasis.  12. Gastroesophageal reflux disease.  13. History of chronic pain syndrome maintained on chronic narcotics.  14. Hyperlipidemia.  15. History of hyponatremia.  16. Previous history of carotid endarterectomy.  17. Hypothyroidism.   HISTORY OF PRESENT ILLNESS:  The patient is a 65 year old male who has  multiple medical problems.  He presents to the emergency department with  complaints of atypical chest pain.  He has a known history of  coronary  disease and has had multiple percutaneous coronary interventions.  He  presented with sharp episode of left pain that seemed to radiate into the  axilla of the left arm.  He also began having right sided neck pain.  There  was shortness of breath as well as significant anxiety.  He was seen by Dr.  Armanda Magic in the emergency room and was admitted for further evaluation.   Please see the dictated History and Physical for further patient  presentation and profile.   LABORATORY DATA:  On admission, sodium 138, potassium 4.3, BUN 13,  creatinine 0.6, CBC normal.  A 12-lead electrocardiogram showed nonspecific  ST abnormalities with normal sinus rhythm.   PROCEDURES:  1.  Chest x-ray on admission showed mild right basilar atelectasis.  There      were no other acute chest findings.  Cardiac catheterization as      performed on July 08, 2004, with results as noted above.  The patient      will continue with medical management.  2.  History of MRI of the orbit face and neck, with and without contrast,  showing no obvious soft tissue pathology.  There was degenerative disk      disease at C5-6 and C6-7.  This will continue to be managed medically as      well.   HOSPITAL COURSE:  The patient was admitted.  He ruled out negative for  myocardial infarction.  He continued to have numerous bouts of neck pain as  well as chest pain that were associated with hyperventilation and anxiety.  His cardiac enzymes were negative.  He had undergone a CT scan of the chest  in the emergency room which showed no evidence of dissection.  It was felt  that in light of his multiple medical problems, it was best to proceed on  with a cardiac catheterization to further evaluate his symptoms.  That  procedure was performed on the afternoon of July 08, 2004, and was  tolerated without any known complications.  There was extensive  calcification, and the patient will need to continue with  medical  management.  Percutaneous coronary intervention or coronary artery bypass  grafting was not felt to be necessary at this point in time.  Postprocedure  he was transferred back to the transitional care unit.  He continued to have  multiple bouts of pain which was managed with narcotics.  He also underwent  MRI of the neck which was followed by Dr. Trey Sailors with no changes in his  medical management at this point in time.  On July 10, 2004, we were trying  to get the patient ready for discharge.  However, he spiked a fever of  101.5.  Cultures were obtained which were unremarkable.  His white count was  elevated at 15,000.  He was started on IV Rocephin with subsequent  improvement.  Today, on July 12, 2004, he is doing well without complaints.  He is currently off of IV narcotics and has been switched over to an oral  regimen.  He has had no recurrence of fever over the last two days.  White  count now is down to 7000.  His chemistries are satisfactory, and he is felt  to be a stable candidate for discharge today with further evaluation to  occur on an outpatient basis.   CONDITION ON DISCHARGE:  Stable.   DISCHARGE MEDICATIONS:  He will resume all his previous medications as he  was taking before.  We will start him on Levaquin 250 mg one p.o. daily for  the next five days.  New prescription for nitroglycerin was also provided.   ACTIVITY:  As tolerated.   FOLLOWUP:  We plan on seeing him back in the office in approximately two  weeks.  He is asked to call to schedule that appointment.  He will continue  with his regular follow up with Dr. Sherril Croon and Dr. Channing Mutters as well, and will be  available if problems arise.      LC/MEDQ  D:  07/12/2004  T:  07/12/2004  Job:  [id number]   cc:   Doreen Beam  [address]  [place]  [place] [address]  Fax: [phone]

## 2010-09-04 NOTE — H&P (Signed)
NAMEMOHAMUD, MROZEK                 ACCOUNT NO.:  [id number]   MEDICAL RECORD NO.:  [id number]          PATIENT TYPE:  INP   LOCATION:  2021                         FACILITY:  [facility]   PHYSICIAN:  Salvatore Decent. Cornelius Moras, M.D. DATE OF BIRTH:  22-Mar-1946   DATE OF ADMISSION:  06/27/2006  DATE OF DISCHARGE:                              HISTORY & PHYSICAL   CHIEF COMPLAINT:  Infected left arm incision.   HISTORY OF PRESENT ILLNESS:  Mr. Faircloth is a 65 year old Caucasian male  with history of prior CVA and status post CABG using bilateral internal  mammary arteries and bilateral radial arteries on June 10, 2006, by  Dr. Evelene Croon.  At this time, there is no Discharge Summary  available, but his hospital course was slightly prolonged secondary to  rehabilitation issues, but ultimately he went home with home health  nursing, physical therapy, and occupational therapy on June 17, 2006.  He was seen by home health nurse on June 20, 2006. He noted some  left arm redness.  She reevaluated this on Thursday, March 6,  and felt  that the erythema was 10 times worse.  She called our office and got  him in to see Dr. Sheliah Plane that day who prescribed Keflex and had  her clean the incision daily with hydrogen peroxide.  Despite these  measures, the wound did not appear to be looking any better, and,  therefore, he presented to the [facility] office again today, June 27, 2006,  and was evaluated by Dr. Tressie Stalker who performed at bedside  debridement of the left forearm wound and felt that he should be  admitted to [facility] for IV vancomycin and probable further  surgical debridement and evacuation of left forearm hematoma.  Mr. Aja  reported that his symptoms began approximately a week ago.  He has had  some generalized left arm edema and has had no fever except warmth of  his left arm.  It is also tender to palpate and has had drainage and  erythema.  Otherwise, he feels he has  been recuperating well from  surgery. He does get short of breath with lying down and still has some  left chest incisional soreness and occasional left chest stabbing pains  which is wife reports has been chronic even preoperatively and has been  diagnosed with neuropathy.  He denies palpitations.   PAST MEDICAL HISTORY:  1. Coronary artery disease status post CABG x4.  He had a left IMA to      the diagonal, right IMA to the left anterior descending, radial      artery graft to the obtuse marginal branch of the left circumflex      coronary artery and radial artery graft to the posterior descending      branch of the right coronary artery on June 10, 2006.  2. History of cerebrovascular disease status post stroke in December      2004,.  He continues to have residual expressive and cognitive      dysphasia.  He subsequently underwent left carotid endarterectomy  by Dr. Madilyn Fireman for high-grade left internal carotid artery stenosis      in 2005.  3. History of peripheral vascular occlusive disease status post left      femoral-popliteal bypass with saphenous vein graft around 1998 and      subsequent right femoral-popliteal bypass with saphenous vein in      2002.  He underwent revision of the right femoral-popliteal bypass      with Gore-Tex interposition graft in June 2004.  4. Diabetes mellitus, type 2.  5. Hypertension .  6. Gastroesophageal reflux disease.  7. History of seizures (petit mal seizure one week ago).  His wife      reports this is a, occurrence after any hospitalization due to even      the slightest adjustment and his seizure medication regimen.  8. Status post colon resection for colon cancer in August 2007      performed at [facility].  (He did not require chemotherapy      or other adjuvant therapy.  9. History of cholecystectomy in the 1980s.  10.Left shoulder surgery.  11.Tonsillectomy and adenoidectomy.   MEDICATIONS:  1. Coated aspirin 325 mg  p.o. daily.  2. Neurontin 600 mg p.o. 3 times a day.  3. Foltx 1 tablet daily.  4. Tegretol 200 mg p.o. q.a.m., 400 mg p.o. q.p.m.  5. Depakote 500 mg p.o. 3 times a day and 1000 mg p.o. nightly .  6. Glucotrol XL 5 mg p.o. daily.  7. Nifedipine 90 mg p.o. daily.  8. Imdur 120 mg p.o. daily.  9. Lipitor 40 mg p.o. daily.  10.Metoprolol  50 mg p.o. b.i.d.  11.Altace 10 mg p.o. b.i.d.  12.Protonix 40 mg p.o. daily.  13.Synthroid 175 mcg daily.  14.Guanfacine 2 mg p.o. daily.  15.Recently started on Keflex on March 6 by Dr. Tyrone Sage.  16.Tylox 1-2 tablets p.o. q.4 h p.r.n. pain   ALLERGIES:  He has intolerance to ADHESIVE TAPE and developed localized  swelling at his IV site with MORPHINE, although it was unclear if this  was an actual allergic reaction versus IV infiltration.   REVIEW OF SYSTEMS:  See HPI for pertinent positives and negatives.  Otherwise he denies constipation, hematochezia, or voiding difficulties.   SOCIAL HISTORY:  He is disabled and lives with his wife.  He quit  smoking about 16 years ago and denies alcohol abuse.   FAMILY HISTORY:  Noncontributory.   PHYSICAL EXAMINATION:  VITAL SIGNS:  Blood pressure 148/84 in the left  arm, pulse 84, respirations 20.  GENERAL APPEARANCE:  This is 65 year old Caucasian male who is alert,  cooperative in no acute distress.  HEENT: Head is normocephalic, atraumatic.  Pupils are equal, round,  reactive to light and accommodation.  He does were glasses.  Sclera  nonicteric.  He does have what appears to be ectopic dermatitis  involving his forehead and maxillary region, more pronounced on the  right than on the left  NECK:  Neck is supple.  He has palpable carotid pulses. No bruits were  auscultated.  He has a well-healed left carotid endarterectomy scar.  CHEST:  Lung sounds are relatively clear and diminished at the bases with minimal crackles at the left base. Breathing was unlabored.  CARDIAC:  His heart has a regular  rate and rhythm.  No murmur, rub or  gallop was noted.  He has median sternotomy incision which appears to be  healing well.  There is minimal linear eschar the distal half of  his  incision.  There is no surrounding cellulitis.  He also has four chest  tube sites which have minimal erythema and no cellulitis mellitus, but  do have a dry eschars at these sites.  ABDOMEN:  His abdomen is soft, nontender, nondistended with good bowel  sounds.  He has a well-healed cholecystectomy scar.  GU/RECTAL:  These exams were deferred.  EXTREMITIES: His extremities are warm and dry.  He does have 1+  nonpitting edema of his ankles.  His ulnar pulses are 2+, and his hands  appear well perfused.  His dorsalis pedis pulses are 2+ on the left and  1+ on the right. His right forearm has minimal linear erythema along the  incision site but does not appear to be grossly infected. His left  forearm incision has been debrided by Dr. Cornelius Moras and currently has a gauze  dressing packed centrally in the incision.  There is some surrounding  erythema and yellow tissue along the incision site.  There is some mild  generalized swelling of his left upper extremity.  NEUROLOGIC:  His gait appears steady.  He his alert. He is oriented x3,  but it takes multiple attempts for him to answer orientation questions  appropriately. His muscle strength appears 5/5 in his upper and lower  extremities, although he does seem to have at least some mild  generalized weakness.   ASSESSMENT:  Left forearm hematoma and wound infection status post left  radial artery harvest  for coronary artery bypass graft surgery carried  out on June 10, 2006. The infection has not improved on oral Keflex.   PLAN:  He will be admitted to [facility] on June 27, 2006,  under the care of Dr. Tressie Stalker as Dr. Evelene Croon is currently  practicing at our [facility] office.  The patient will be started on IV  vancomycin.  At our office,  the wound was cultured.  Will have the  nursing staff perform wet-to-dry saline gauze dressing changes b.i.d.  and anticipate that he may need further debridement with evacuation of  hematoma in the operating room.      Jerold Coombe, P.A.      Salvatore Decent. Cornelius Moras, M.D.  Electronically Signed    AWZ/MEDQ  D:  06/27/2006  T:  06/27/2006  Job:  [id number]

## 2010-09-04 NOTE — Cardiovascular Report (Signed)
NAME:  [name], [name]                           ACCOUNT NO.:  [id number]   MEDICAL RECORD NO.:  [id number]                   PATIENT TYPE:  OIB   LOCATION:  NA                                   FACILITY:  [facility]   PHYSICIAN:  Balinda Quails, M.D.                 DATE OF BIRTH:  10-14-1945   DATE OF PROCEDURE:  09/13/2002  DATE OF DISCHARGE:                              CARDIAC CATHETERIZATION   PHYSICIAN:  Balinda Quails, M.D.   DIAGNOSES:  Recurrent claudication right lower extremity.   PROCEDURE:  1. Abdominal aortogram with bilateral lower extremity runoff arteriography.  2. Selective right lower extremity arteriogram.   ACCESS:  Left common femoral artery 5-French sheath.   CONTRAST:  130 ml of Visipaque.   COMPLICATIONS:  None apparent.   CLINICAL NOTE:  [name] is a 65 year old male with a history of type 2  diabetes and advanced peripheral vascular disease.  He has previously  undergone bilateral superficial femoral popliteal bypass grafts.  He  recently developed recurrent claudication in his right lower extremity.  Doppler duplex evaluation revealed disease in the superficial femoral artery  proximal to the bypass graft.  He is brought to the catheterization  laboratory at this time for diagnostic arteriography and possible  intervention.   PROCEDURE NOTE:  The patient brought to the catheterization lab in stable  condition.  Placed in the supine position.   The groins were prepped and draped in the sterile fashion.  Administered 1  mg of Versed and 50 m mcg of fentanyl IV.   Skin and subcutaneous tissue of the left groin instilled with 1% Xylocaine.  A needle was induced into the left common femoral artery.  A 0.35 Amplatz  super stiff guide wire was advanced through the needle into the midabdominal  aorta under fluoroscopy.   A 5-French sheath was then advanced over the guidewire.  The dilator was  removed, and the sheath flushed with heparin and saline  solution.  A pigtail  catheter was then advanced over the guidewire.  Pigtail catheter advanced to  the juxtarenal position.   A standard AP abdominal aortogram was obtained.  This revealed single  bilateral widely patent renal arteries.  Mild infrarenal atherosclerotic  disease without stenosis in the aorta.  Irregularity of the common iliac  arteries bilaterally without dominant stenosis.  The internal/external iliac  arteries were patent.   The lower extremity runoff arteriography then obtained.  There was  continuous flow to the common femoral level bilaterally.   The left lower extremity revealed patent profunda and proximal superficial  femoral arteries.  There was a left superficial femoral to popliteal vein  graft bypass in the left leg which was patent.  Intact three vessel tibial  runoff in the left calf.   The right lower extremity revealed the common femoral artery to be patent.  The profunda femoris revealed  a plaque and a stenosis in its midportion  estimated to be approximately 50%.  There was subtotal occlusion of the  proximal right superficial femoral artery with extensive plaque.  The right  superficial femoral artery then reconstituted in the proximal thigh.  The  right superficial femoral to popliteal vein graft was patent.  There was  intact tibial runoff in the right lower extremity.   In order to obtain a more detailed view of the right leg the pigtail  catheter was brought down to the aortic bifurcation, hooked on the right  common iliac artery and the Wholey guidewire advanced down into the right  external iliac artery.  The pigtail catheter exchanged for an end-hole  catheter which was advanced down into the right external iliac artery.  A  selective right lower extremity arteriogram was obtained.  This verified  subtotal occlusion of the proximal right superficial femoral artery for a  segment of approximately 5-6 cm.  This arose to just beyond the origin  of  the right superficial femoral artery.  This was not felt to be a lesion  which would be readily amenable to angioplasty or stenting due to its close  association with the right common femoral artery.   This completed the arteriogram procedure, the end-hole catheter removed.  Left femoral sheath removed.  No apparent complications.  Total 130 mL of  contrast Visipaque administered.   FINAL IMPRESSION:  1. Patent left superficial femoral popliteal bypass with intact tibial     runoff.  2. Subtotal occlusion of the proximal right superficial femoral artery.  3. Patency of the right superficial femoral popliteal bypass distal to the     right superficial femoral artery occlusion.   DISPOSITION:  These results have been discussed with the patient and family.  It is recommended that patient undergo revision of his right superficial  femoral artery popliteal bypass with a graft extending from the common  femoral artery.  Once the patient has been cleared from cardiac standpoint  this will be scheduled.                                               Balinda Quails, M.D.    PGH/MEDQ  D:  09/13/2002  T:  09/13/2002  Job:  [id number]   cc:   [facility]   Colleen Can. Deborah Chalk, M.D.  [address], [address]  [place]  [place] [address]  Fax: [phone]

## 2010-09-08 ENCOUNTER — Encounter (INDEPENDENT_AMBULATORY_CARE_PROVIDER_SITE_OTHER): Payer: Medicare Other

## 2010-09-08 DIAGNOSIS — Z48812 Encounter for surgical aftercare following surgery on the circulatory system: Secondary | ICD-10-CM

## 2010-09-08 DIAGNOSIS — I739 Peripheral vascular disease, unspecified: Secondary | ICD-10-CM

## 2010-09-16 NOTE — Procedures (Unsigned)
BYPASS GRAFT EVALUATION  INDICATION:  Follow up peripheral vascular disease.  HISTORY: Diabetes:  Yes. Cardiac:  CABG. Hypertension:  Yes. Smoking:  Previous. Previous Surgery:  Left superficial femoral artery to popliteal artery bypass graft on 07/30/1996.  Revision right femoral to popliteal bypass graft on 10/11/2002, both by Dr. Madilyn Fireman.  Right femoral to tibial peroneal trunk artery bypass graft on 11/19/2009.  Redo of left superficial femoral artery to posterior tibial artery bypass graft on 06/04/2009 by Dr. Hart Rochester.  SINGLE LEVEL ARTERIAL EXAM                              RIGHT              LEFT Brachial:                    153                150 Anterior tibial:             157                151 Posterior tibial:            159                150 Peroneal: Ankle/brachial index:        1.04               0.99  PREVIOUS ABI:  Date:  03/10/2010  RIGHT:  1.37  LEFT:  1.33  LOWER EXTREMITY BYPASS GRAFT DUPLEX EXAM:  DUPLEX:  Technically difficult and limited evaluation of bilateral bypass grafts due to marked vessel depth and vessel wall calcification making Doppler interrogation difficult.  When and where imaged bypass grafts appear patent bilaterally. A 50%-75% stenosis present involving the left proximal superficial femoral artery.  IMPRESSION: 1. Patent right femoral to tibial peroneal trunk bypass graft, when     and where imaged. 2. Patent left mid superficial femoral to posterior tibial artery     bypass graft, when and where imaged. 3. Stenosis present as mentioned above. 4. Ankle brachial indices have dropped however are within normal range     since previous study on 03/11/2011.   ___________________________________________ Quita Skye. Hart Rochester, M.D.  SH/MEDQ  D:  09/08/2010  T:  09/08/2010  Job:  [id number]

## 2010-10-08 ENCOUNTER — Encounter: Payer: Self-pay | Admitting: Nurse Practitioner

## 2010-10-13 ENCOUNTER — Other Ambulatory Visit: Payer: Self-pay | Admitting: *Deleted

## 2010-10-13 DIAGNOSIS — Z79899 Other long term (current) drug therapy: Secondary | ICD-10-CM

## 2010-10-13 DIAGNOSIS — E785 Hyperlipidemia, unspecified: Secondary | ICD-10-CM

## 2010-10-14 ENCOUNTER — Other Ambulatory Visit (INDEPENDENT_AMBULATORY_CARE_PROVIDER_SITE_OTHER): Payer: Medicare Other | Admitting: *Deleted

## 2010-10-14 ENCOUNTER — Other Ambulatory Visit: Payer: Self-pay | Admitting: Nurse Practitioner

## 2010-10-14 ENCOUNTER — Ambulatory Visit (INDEPENDENT_AMBULATORY_CARE_PROVIDER_SITE_OTHER): Payer: Medicare Other | Admitting: Nurse Practitioner

## 2010-10-14 ENCOUNTER — Encounter: Payer: Self-pay | Admitting: Nurse Practitioner

## 2010-10-14 VITALS — BP 106/68 | HR 60 | Ht 69.0 in | Wt 228.0 lb

## 2010-10-14 DIAGNOSIS — I10 Essential (primary) hypertension: Secondary | ICD-10-CM | POA: Insufficient documentation

## 2010-10-14 DIAGNOSIS — Z79899 Other long term (current) drug therapy: Secondary | ICD-10-CM

## 2010-10-14 DIAGNOSIS — I2510 Atherosclerotic heart disease of native coronary artery without angina pectoris: Secondary | ICD-10-CM | POA: Insufficient documentation

## 2010-10-14 DIAGNOSIS — E039 Hypothyroidism, unspecified: Secondary | ICD-10-CM

## 2010-10-14 DIAGNOSIS — E785 Hyperlipidemia, unspecified: Secondary | ICD-10-CM | POA: Insufficient documentation

## 2010-10-14 LAB — CBC WITH DIFFERENTIAL/PLATELET
Basophils Absolute: 0 10*3/uL (ref 0.0–0.1)
Basophils Relative: 0.5 % (ref 0.0–3.0)
Eosinophils Absolute: 0.2 10*3/uL (ref 0.0–0.7)
Eosinophils Relative: 4.7 % (ref 0.0–5.0)
HCT: 37.8 % — ABNORMAL LOW (ref 39.0–52.0)
Hemoglobin: 13 g/dL (ref 13.0–17.0)
Lymphocytes Relative: 45.1 % (ref 12.0–46.0)
Lymphs Abs: 1.9 10*3/uL (ref 0.7–4.0)
MCHC: 34.4 g/dL (ref 30.0–36.0)
MCV: 93.5 fl (ref 78.0–100.0)
Monocytes Absolute: 0.7 10*3/uL (ref 0.1–1.0)
Monocytes Relative: 15.1 % — ABNORMAL HIGH (ref 3.0–12.0)
Neutro Abs: 1.5 10*3/uL (ref 1.4–7.7)
Neutrophils Relative %: 34.6 % — ABNORMAL LOW (ref 43.0–77.0)
Platelets: 178 10*3/uL (ref 150.0–400.0)
RBC: 4.05 Mil/uL — ABNORMAL LOW (ref 4.22–5.81)
RDW: 14.7 % — ABNORMAL HIGH (ref 11.5–14.6)
WBC: 4.3 10*3/uL — ABNORMAL LOW (ref 4.5–10.5)

## 2010-10-14 LAB — HEPATIC FUNCTION PANEL
ALT: 13 U/L (ref 0–53)
AST: 19 U/L (ref 0–37)
Albumin: 3.7 g/dL (ref 3.5–5.2)
Alkaline Phosphatase: 48 U/L (ref 39–117)
Bilirubin, Direct: 0.1 mg/dL (ref 0.0–0.3)
Total Bilirubin: 0.5 mg/dL (ref 0.3–1.2)
Total Protein: 8 g/dL (ref 6.0–8.3)

## 2010-10-14 LAB — BASIC METABOLIC PANEL
BUN: 25 mg/dL — ABNORMAL HIGH (ref 6–23)
CO2: 26 mEq/L (ref 19–32)
Calcium: 8.5 mg/dL (ref 8.4–10.5)
Chloride: 106 mEq/L (ref 96–112)
Creatinine, Ser: 0.8 mg/dL (ref 0.4–1.5)
GFR: 107.78 mL/min (ref >60.00)
Glucose, Bld: 99 mg/dL (ref 70–99)
Potassium: 4.6 mEq/L (ref 3.5–5.1)
Sodium: 141 mEq/L (ref 135–145)

## 2010-10-14 LAB — TSH: TSH: 6.2 u[IU]/mL — ABNORMAL HIGH (ref 0.35–5.50)

## 2010-10-14 LAB — LIPID PANEL
Cholesterol: 148 mg/dL (ref 0–200)
HDL: 32.7 mg/dL — ABNORMAL LOW (ref >39.00)
Total CHOL/HDL Ratio: 5
Triglycerides: 259 mg/dL — ABNORMAL HIGH (ref 0.0–149.0)
VLDL: 51.8 mg/dL — ABNORMAL HIGH (ref 0.0–40.0)

## 2010-10-14 LAB — LDL CHOLESTEROL, DIRECT: Direct LDL: 76.1 mg/dL

## 2010-10-14 NOTE — Assessment & Plan Note (Signed)
He is holding his own. No changes with his medicines today. Labs are checked. I will have him see Dr. Diona Browner in [place] in 4 months for follow up. Patient's wife is agreeable to this plan and will call if any problems develop in the interim.

## 2010-10-14 NOTE — Patient Instructions (Signed)
Stay on your current medicines We will check some lab work today I will have you see Dr. Nona Dell in about 4 months. He is at the [facility] office Call for any problems.

## 2010-10-14 NOTE — Progress Notes (Signed)
[name] Date of Birth: 16-Aug-1945   History of Present Illness: [name] is seen today for his 4 month visit. He is seen for Dr. Diona Browner. He is a former patient of Dr. Ronnald Nian. He is seen with his wife. She provides most of the history. He continues to have his chronic chest pain syndrome. He remains on chronic narcotics that are managed by Dr. Sherril Croon. He gets most of his other medicines from the [place]. He continues to stay up at night and sleep in the daytime. He apparently will be needing a colonoscopy to follow up his colon cancer. She is not wanting to have this done at the [place]. Overall, [name] seems the same and is holding his own.  Blood pressure is ok. He is on chronic coumadin for his vascular disease.   Current Outpatient Prescriptions on File Prior to Visit  Medication Sig Dispense Refill  . ALPRAZolam (XANAX PO) Take 1 mg by mouth as needed.       Marland Kitchen amLODipine (NORVASC) 10 MG tablet Take 10 mg by mouth daily.        Marland Kitchen aspirin 81 MG EC tablet Take 81 mg by mouth daily.        . carbamazepine (TEGRETOL XR) 200 MG 12 hr tablet Take 200 mg by mouth daily. 5x daily       . citalopram (CELEXA) 10 MG tablet Take 10 mg by mouth daily.        . Divalproex Sodium (DEPAKOTE PO) Take by mouth daily. 6X DAILY       . fish oil-omega-3 fatty acids 1000 MG capsule Take 2 g by mouth daily.        . furosemide (LASIX) 40 MG tablet Take 40 mg by mouth daily.        Marland Kitchen glipiZIDE (GLUCOTROL) 5 MG tablet Take 5 mg by mouth daily.        . isosorbide mononitrate (IMDUR) 120 MG 24 hr tablet Take 120 mg by mouth 2 (two) times daily.        Marland Kitchen levothyroxine (SYNTHROID, LEVOTHROID) 200 MCG tablet Take 200 mcg by mouth daily.        . metoprolol (LOPRESSOR) 100 MG tablet Take 100 mg by mouth 2 (two) times daily.        . pantoprazole (PROTONIX) 40 MG tablet Take 40 mg by mouth daily.        . ramipril (ALTACE) 10 MG tablet Take 10 mg by mouth daily.        . rosuvastatin (CRESTOR) 40 MG tablet Take 40 mg  by mouth daily.        Marland Kitchen warfarin (COUMADIN) 6 MG tablet Take 6 mg by mouth as directed.        Marland Kitchen DISCONTD: gemfibrozil (LOPID) 600 MG tablet Take 600 mg by mouth 2 (two) times daily before a meal.        . DISCONTD: oxyCODONE-acetaminophen (TYLOX) 5-500 MG per capsule Take 1 capsule by mouth every 4 (four) hours as needed.        Marland Kitchen DISCONTD: simvastatin (ZOCOR) 80 MG tablet Take 80 mg by mouth at bedtime.          No Known Allergies  Past Medical History  Diagnosis Date  . PVD (peripheral vascular disease)     Remote patch angioplasty of the left femoral, prior L CEA and prior extensive lower extremity surgeries.  . Diabetes mellitus   . Hyperlipidemia   . Lower extremity pain   .  Obesity   . Seizure disorder   . OSA (obstructive sleep apnea)   . History of colon cancer 2007  . CVA (cerebrovascular accident) Dec 2004    Persistent residual expressive aphasia  . IHD (ischemic heart disease)   . Chest discomfort     Chronic; On chronic narcotics. Narcotics are managed by Dr. Sherril Croon  . Cervical spine disease   . Hx of CABG   . Abnormal nuclear cardiac imaging test July 2011    Consistent with Cath/CABG data  . Iron deficiency anemia     Managed by Dr. Myna Hidalgo  . Chronic anticoagulation   . PAF (paroxysmal atrial fibrillation)     Past Surgical History  Procedure Date  . Cardiac catheterization 06/06/2006  . Carotid endarterectomy   . Low extremity surgery   . Colon surgery   . Coronary artery bypass graft 05/2006    X4  . Coronary angioplasty 1990    History  Smoking status  . Former Smoker -- 1.0 packs/day for 25 years  . Types: Cigarettes  . Quit date: 10/14/1990  Smokeless tobacco  . Never Used    History  Alcohol Use No    Family History  Problem Relation Age of Onset  . Diabetes Mother     Review of Systems: The review of systems is positive for expressive aphasia. He is chronically tired and has chronic chest pain. No palpitations. No falls. Medicines  are going ok. He uses his CPAP inconsistently.  All other systems were reviewed and are negative.  Physical Exam: BP 106/68  Pulse 60  Ht 5\' 9"  (1.753 m)  Wt 228 lb (103.42 kg)  BMI 33.67 kg/m2 Patient is very pleasant and in no acute distress. Skin is warm and dry. Color is normal.  HEENT is unremarkable. Speech remains rambling in nature. Normocephalic/atraumatic. PERRL. Sclera are nonicteric. Neck is supple. No masses. No JVD. Lungs are clear. Cardiac exam shows a regular rate and rhythm. Abdomen is soft. Extremities are without edema. Gait and ROM are intact. No gross neurologic deficits noted.  LABORATORY DATA: Pending   Assessment / Plan:

## 2010-10-14 NOTE — Assessment & Plan Note (Signed)
Labs are checked today.  

## 2010-10-14 NOTE — Assessment & Plan Note (Signed)
Blood pressure looks good. No changes in medicines.

## 2010-10-15 ENCOUNTER — Telehealth: Payer: Self-pay | Admitting: *Deleted

## 2010-10-15 NOTE — Telephone Encounter (Signed)
Message copied by Adolphus Birchwood on Thu Oct 15, 2010 11:33 AM ------      Message from: Rosalio Macadamia      Created: Wed Oct 14, 2010  4:54 PM       Ok to report. Copy of labs to Dr. Sherril Croon and copy of CBC to Dr. Myna Hidalgo

## 2010-10-15 NOTE — Telephone Encounter (Signed)
Left message for wife with lab results and to discuss with Dr. Sherril Croon adjusting thyroid medications.  Wife told to have pt get labs in six months and to call back with any questions.  RN faxed labs to Dr. Myna Hidalgo and Dr. Sherril Croon.

## 2010-12-24 ENCOUNTER — Encounter (HOSPITAL_COMMUNITY): Payer: Self-pay | Admitting: Emergency Medicine

## 2010-12-24 ENCOUNTER — Other Ambulatory Visit: Payer: Self-pay

## 2010-12-24 ENCOUNTER — Emergency Department (HOSPITAL_COMMUNITY): Payer: Medicare Other

## 2010-12-24 ENCOUNTER — Observation Stay (HOSPITAL_COMMUNITY)
Admission: EM | Admit: 2010-12-24 | Discharge: 2010-12-25 | Disposition: A | Discharge status: Home / Self Care | Payer: Medicare Other | Attending: Internal Medicine | Admitting: Internal Medicine

## 2010-12-24 DIAGNOSIS — G894 Chronic pain syndrome: Secondary | ICD-10-CM | POA: Insufficient documentation

## 2010-12-24 DIAGNOSIS — R0789 Other chest pain: Principal | ICD-10-CM | POA: Insufficient documentation

## 2010-12-24 DIAGNOSIS — Z79899 Other long term (current) drug therapy: Secondary | ICD-10-CM | POA: Insufficient documentation

## 2010-12-24 DIAGNOSIS — I2510 Atherosclerotic heart disease of native coronary artery without angina pectoris: Secondary | ICD-10-CM | POA: Diagnosis present

## 2010-12-24 DIAGNOSIS — E785 Hyperlipidemia, unspecified: Secondary | ICD-10-CM | POA: Insufficient documentation

## 2010-12-24 DIAGNOSIS — E119 Type 2 diabetes mellitus without complications: Secondary | ICD-10-CM | POA: Insufficient documentation

## 2010-12-24 DIAGNOSIS — Z951 Presence of aortocoronary bypass graft: Secondary | ICD-10-CM | POA: Insufficient documentation

## 2010-12-24 DIAGNOSIS — Z7901 Long term (current) use of anticoagulants: Secondary | ICD-10-CM | POA: Insufficient documentation

## 2010-12-24 DIAGNOSIS — I4891 Unspecified atrial fibrillation: Secondary | ICD-10-CM | POA: Insufficient documentation

## 2010-12-24 DIAGNOSIS — I10 Essential (primary) hypertension: Secondary | ICD-10-CM | POA: Insufficient documentation

## 2010-12-24 DIAGNOSIS — R079 Chest pain, unspecified: Secondary | ICD-10-CM

## 2010-12-24 DIAGNOSIS — D649 Anemia, unspecified: Secondary | ICD-10-CM | POA: Insufficient documentation

## 2010-12-24 DIAGNOSIS — I739 Peripheral vascular disease, unspecified: Secondary | ICD-10-CM | POA: Insufficient documentation

## 2010-12-24 LAB — CBC
MCHC: 33.4 g/dL (ref 30.0–36.0)
MCV: 92.1 fL (ref 78.0–100.0)
Platelets: 174 10*3/uL (ref 150–400)
RDW: 14.1 % (ref 11.5–15.5)
WBC: 3.8 10*3/uL — ABNORMAL LOW (ref 4.0–10.5)

## 2010-12-24 LAB — BASIC METABOLIC PANEL
Chloride: 99 mEq/L (ref 96–112)
Creatinine, Ser: 0.55 mg/dL (ref 0.50–1.35)
GFR calc Af Amer: >60 mL/min (ref >60)
GFR calc non Af Amer: >60 mL/min (ref >60)
Potassium: 3.8 mEq/L (ref 3.5–5.1)

## 2010-12-24 LAB — CARDIAC PANEL(CRET KIN+CKTOT+MB+TROPI): CK, MB: 3.2 ng/mL (ref 0.3–4.0)

## 2010-12-24 LAB — PROTIME-INR: Prothrombin Time: 23.1 seconds — ABNORMAL HIGH (ref 11.6–15.2)

## 2010-12-24 MED ORDER — NITROGLYCERIN 0.4 MG SL SUBL: 0.4000 mg | SUBLINGUAL_TABLET | SUBLINGUAL | Status: DC | PRN

## 2010-12-24 NOTE — ED Notes (Signed)
Left chest pain with sob increasing over 2 days

## 2010-12-24 NOTE — ED Provider Notes (Signed)
Scribed for Dr. Bebe Shaggy, the patient was seen in room 12. This chart was scribed by Hillery Hunter. This patient's care was started at 21:22.   History   CSN: [id number] Arrival date & time: 12/24/2010  9:01 PM  Chief Complaint  Patient presents with  . Chest Pain  . Shortness of Breath   Patient is a 65 y.o. male presenting with chest pain. The history is provided by the patient and the spouse.  Chest Pain  Associated symptoms include weakness (generalized).    [name] is a 65 y.o. male who presents to the Emergency Department complaining of feeling generally weak over the last three days. He complains now of left chest pain without vomiting or shortness of breath. He has sweats occasionally and chronically with history of diabetes. The patient is a poor historian due to aphasia secondary to old CVA. He has an appointment with his new cardiologist Nona Dell with [facility] cardiology.   Past Medical History  Diagnosis Date  . PVD (peripheral vascular disease)     Remote patch angioplasty of the left femoral, prior L CEA and prior extensive lower extremity surgeries.  . Diabetes mellitus   . Hyperlipidemia   . Lower extremity pain   . Obesity   . Seizure disorder   . OSA (obstructive sleep apnea)   . History of colon cancer 2007  . CVA (cerebrovascular accident) Dec 2004    Persistent residual expressive aphasia  . IHD (ischemic heart disease)   . Chest discomfort     Chronic; On chronic narcotics. Narcotics are managed by Dr. Sherril Croon  . Cervical spine disease   . Hx of CABG   . Abnormal nuclear cardiac imaging test July 2011    Consistent with Cath/CABG data  . Iron deficiency anemia     Managed by Dr. Myna Hidalgo  . Chronic anticoagulation   . PAF (paroxysmal atrial fibrillation)     Past Surgical History  Procedure Date  . Cardiac catheterization 06/06/2006  . Carotid endarterectomy   . Low extremity surgery   . Colon surgery   . Coronary artery  bypass graft 05/2006    X4  . Coronary angioplasty 1990    Family History  Problem Relation Age of Onset  . Diabetes Mother     History  Substance Use Topics  . Smoking status: Former Smoker -- 1.0 packs/day for 25 years    Types: Cigarettes    Quit date: 10/14/1990  . Smokeless tobacco: Never Used  . Alcohol Use: No      Review of Systems  Unable to perform ROS Neurological: Positive for weakness (generalized).  ROS limited secondary to patient's aphasia  Physical Exam  BP 165/64  Pulse 51  Temp(Src) 98.5 F (36.9 C) (Oral)  Resp 13  Ht 5\' 9"  (1.753 m)  Wt 250 lb (113.399 kg)  BMI 36.92 kg/m2  SpO2 99%  Physical Exam  Nursing note and vitals reviewed. CONSTITUTIONAL: Well developed/well nourished, obese HEAD AND FACE: Normocephalic/atraumatic EYES: EOMI/PERRL ENMT: Mucous membranes moist NECK: supple no meningeal signs CV: S1/S2 noted, no murmurs/rubs/gallops noted, old midline chest scar c/w CABG, mild ttp left chest LUNGS: Lungs are clear to auscultation bilaterally, no apparent distress ABDOMEN: soft, nontender, no rebound or guarding NEURO: Pt is awake/alert,he has baseline expressive aphasia EXTREMITIES: pulses normal, full ROM SKIN: warm, color normal PSYCH: no abnormalities of mood noted   ED Course  Procedures  OTHER DATA REVIEWED: Nursing notes, vital signs, and past medical records reviewed. xrays  reviewed and considered All labs/vitals reviewed and considered    Date: 12/24/2010  Rate: 50  Rhythm: sinus bradycardia  QRS Axis: normal  Intervals: PR prolonged  ST/T Wave abnormalities: nonspecific ST changes  Conduction Disutrbances:first-degree A-V block   Narrative Interpretation:   Old EKG Reviewed: unchanged   DIAGNOSTIC STUDIES: 21:32. Oxygen Saturation is 98% on room air, normal by my interpretation.    LABS / RADIOLOGY:  Results for orders placed during the hospital encounter of 12/24/10  CBC      Component Value Range    WBC 3.8 (*) 4.0 - 10.5 (K/uL)   RBC 3.54 (*) 4.22 - 5.81 (MIL/uL)   Hemoglobin 10.9 (*) 13.0 - 17.0 (g/dL)   HCT 16.1 (*) 09.6 - 52.0 (%)   MCV 92.1  78.0 - 100.0 (fL)   MCH 30.8  26.0 - 34.0 (pg)   MCHC 33.4  30.0 - 36.0 (g/dL)   RDW 04.5  40.9 - 81.1 (%)   Platelets 174  150 - 400 (K/uL)  BASIC METABOLIC PANEL      Component Value Range   Sodium 138  135 - 145 (mEq/L)   Potassium 3.8  3.5 - 5.1 (mEq/L)   Chloride 99  96 - 112 (mEq/L)   CO2 30  19 - 32 (mEq/L)   Glucose, Bld 136 (*) 70 - 99 (mg/dL)   BUN 19  6 - 23 (mg/dL)   Creatinine, Ser 9.14  0.50 - 1.35 (mg/dL)   Calcium 8.3 (*) 8.4 - 10.5 (mg/dL)   GFR calc non Af Amer >60  >60 (mL/min)   GFR calc Af Amer >60  >60 (mL/min)  PROTIME-INR      Component Value Range   Prothrombin Time 23.1 (*) 11.6 - 15.2 (seconds)   INR 2.01 (*) 0.00 - 1.49   CARDIAC PANEL(CRET KIN+CKTOT+MB+TROPI)      Component Value Range   Total CK 115  7 - 232 (U/L)   CK, MB 3.2  0.3 - 4.0 (ng/mL)   Troponin I <0.30  <0.30 (ng/mL)   Relative Index 2.8 (*) 0.0 - 2.5    PORTABLE CHEST - 1 VIEW  Comparison: 11/18/2009  Findings: Patient has had median sternotomy and CABG. Heart is enlarged. There is pulmonary vascular congestion. No overt alveolar edema. There are no focal consolidations or pleural effusions. Minimal bilateral lower lobe atelectasis.  IMPRESSION:  1. Cardiomegaly and vascular congestion. 2. Bibasilar atelectasis.  Original Report Authenticated By: Patterson Hammersmith, M.D.     ED COURSE / COORDINATION OF CARE: 21:05. Ordered cardiac panel, protime-INR, , CBC, BMP, monitor, pulse ox, saline lock, draw and hold blood rainbow, EKG, CXR,  23:05. Discussed case with dr Orvan Falconer.  Given h/o CAD, poor historian, and reported chest pain with weakness/diaphoresis, will admit Family agreeable Did not give ASA as pt is on coumadin and therapeutic  MDM: will admit for monitoring chest pain workup   SCRIBE ATTESTATION: I personally  performed the services described in this documentation, which was scribed in my presence. The recorded information has been reviewed and considered. Joya Gaskins, MD      Joya Gaskins, MD 12/24/10 [phone]

## 2010-12-25 LAB — BASIC METABOLIC PANEL
BUN: 15 mg/dL (ref 6–23)
CO2: 31 mEq/L (ref 19–32)
Chloride: 100 mEq/L (ref 96–112)
Creatinine, Ser: 0.52 mg/dL (ref 0.50–1.35)
Glucose, Bld: 115 mg/dL — ABNORMAL HIGH (ref 70–99)
Potassium: 3.9 mEq/L (ref 3.5–5.1)

## 2010-12-25 LAB — CARDIAC PANEL(CRET KIN+CKTOT+MB+TROPI)
CK, MB: 3.1 ng/mL (ref 0.3–4.0)
Relative Index: INVALID (ref 0.0–2.5)
Total CK: 91 U/L (ref 7–232)
Troponin I: <0.3 ng/mL

## 2010-12-25 LAB — RETICULOCYTES
RBC.: 3.56 MIL/uL — ABNORMAL LOW (ref 4.22–5.81)
Retic Count, Absolute: 60.5 K/uL (ref 19.0–186.0)
Retic Ct Pct: 1.7 % (ref 0.4–3.1)

## 2010-12-25 LAB — IRON AND TIBC
Iron: 88 ug/dL (ref 42–135)
Saturation Ratios: 29 % (ref 20–55)
TIBC: 305 ug/dL (ref 215–435)
UIBC: 217 ug/dL (ref 125–400)

## 2010-12-25 LAB — PROTIME-INR
INR: 2.02 — ABNORMAL HIGH (ref 0.00–1.49)
Prothrombin Time: 23.2 s — ABNORMAL HIGH (ref 11.6–15.2)

## 2010-12-25 LAB — HEMOGLOBIN A1C
Hgb A1c MFr Bld: 6.4 % — ABNORMAL HIGH
Mean Plasma Glucose: 137 mg/dL — ABNORMAL HIGH

## 2010-12-25 LAB — FERRITIN: Ferritin: 99 ng/mL (ref 22–322)

## 2010-12-25 LAB — HEPATIC FUNCTION PANEL
ALT: 7 U/L (ref 0–53)
AST: 12 U/L (ref 0–37)
Albumin: 3.1 g/dL — ABNORMAL LOW (ref 3.5–5.2)
Alkaline Phosphatase: 44 U/L (ref 39–117)
Bilirubin, Direct: 0.1 mg/dL (ref 0.0–0.3)
Indirect Bilirubin: 0.2 mg/dL — ABNORMAL LOW (ref 0.3–0.9)
Total Bilirubin: 0.3 mg/dL (ref 0.3–1.2)
Total Protein: 7.5 g/dL (ref 6.0–8.3)

## 2010-12-25 LAB — CBC
HCT: 33.3 % — ABNORMAL LOW (ref 39.0–52.0)
Hemoglobin: 10.9 g/dL — ABNORMAL LOW (ref 13.0–17.0)
MCH: 30.4 pg (ref 26.0–34.0)
MCHC: 32.7 g/dL (ref 30.0–36.0)
MCV: 93 fL (ref 78.0–100.0)
Platelets: 180 K/uL (ref 150–400)
RBC: 3.58 MIL/uL — ABNORMAL LOW (ref 4.22–5.81)
RDW: 14.3 % (ref 11.5–15.5)
WBC: 4.3 K/uL (ref 4.0–10.5)

## 2010-12-25 LAB — VALPROIC ACID LEVEL: Valproic Acid Lvl: 64 ug/mL (ref 50.0–100.0)

## 2010-12-25 LAB — GLUCOSE, CAPILLARY: Glucose-Capillary: 91 mg/dL (ref 70–99)

## 2010-12-25 LAB — VITAMIN B12: Vitamin B-12: 842 pg/mL (ref 211–911)

## 2010-12-25 LAB — URINALYSIS, ROUTINE W REFLEX MICROSCOPIC
Hgb urine dipstick: NEGATIVE
Ketones, ur: NEGATIVE mg/dL
Nitrite: NEGATIVE
Urobilinogen, UA: 2 mg/dL — ABNORMAL HIGH (ref 0.0–1.0)

## 2010-12-25 LAB — URINE MICROSCOPIC-ADD ON

## 2010-12-25 MED ORDER — INSULIN ASPART 100 UNIT/ML ~~LOC~~ SOLN: 0.0000 [IU] | Freq: Three times a day (TID) | SUBCUTANEOUS | Status: DC

## 2010-12-25 MED ORDER — OMEGA-3-ACID ETHYL ESTERS 1 G PO CAPS
2.0000 g | ORAL_CAPSULE | Freq: Every day | ORAL | Status: DC
  Administered 2010-12-25: 2 g via ORAL
  Filled 2010-12-25: qty 2

## 2010-12-25 MED ORDER — AMLODIPINE BESYLATE 5 MG PO TABS
10.0000 mg | ORAL_TABLET | Freq: Every day | ORAL | Status: DC
  Administered 2010-12-25: 10 mg via ORAL
  Filled 2010-12-25: qty 2

## 2010-12-25 MED ORDER — WARFARIN SODIUM 6 MG PO TABS: 6.0000 mg | ORAL_TABLET | Freq: Once | ORAL | Status: DC

## 2010-12-25 MED ORDER — ROSUVASTATIN CALCIUM 20 MG PO TABS: 40.0000 mg | ORAL_TABLET | ORAL | Status: DC

## 2010-12-25 MED ORDER — BISACODYL 10 MG RE SUPP: 10.0000 mg | RECTAL | Status: DC | PRN

## 2010-12-25 MED ORDER — FUROSEMIDE 40 MG PO TABS
40.0000 mg | ORAL_TABLET | Freq: Every day | ORAL | Status: DC
  Administered 2010-12-25: 40 mg via ORAL
  Filled 2010-12-25: qty 1

## 2010-12-25 MED ORDER — GLIPIZIDE 5 MG PO TABS
5.0000 mg | ORAL_TABLET | Freq: Every day | ORAL | Status: DC
  Administered 2010-12-25: 5 mg via ORAL
  Filled 2010-12-25: qty 1

## 2010-12-25 MED ORDER — SODIUM CHLORIDE 0.9 % IJ SOLN: 3.0000 mL | INTRAMUSCULAR | Status: DC | PRN

## 2010-12-25 MED ORDER — ALPRAZOLAM 0.5 MG PO TABS: 0.5000 mg | ORAL_TABLET | Freq: Every evening | ORAL | Status: DC | PRN

## 2010-12-25 MED ORDER — INSULIN ASPART 100 UNIT/ML ~~LOC~~ SOLN: 0.0000 [IU] | Freq: Every day | SUBCUTANEOUS | Status: DC

## 2010-12-25 MED ORDER — METOPROLOL TARTRATE 50 MG PO TABS
100.0000 mg | ORAL_TABLET | Freq: Two times a day (BID) | ORAL | Status: DC
  Administered 2010-12-25 (×2): 100 mg via ORAL
  Filled 2010-12-25 (×2): qty 2

## 2010-12-25 MED ORDER — ASPIRIN EC 81 MG PO TBEC
81.0000 mg | DELAYED_RELEASE_TABLET | Freq: Every day | ORAL | Status: DC
  Administered 2010-12-25: 81 mg via ORAL

## 2010-12-25 MED ORDER — CARBAMAZEPINE 200 MG PO TABS
200.0000 mg | ORAL_TABLET | Freq: Every day | ORAL | Status: DC
  Administered 2010-12-25 (×2): 200 mg via ORAL
  Filled 2010-12-25 (×2): qty 1

## 2010-12-25 MED ORDER — ISOSORBIDE MONONITRATE ER 60 MG PO TB24
120.0000 mg | ORAL_TABLET | Freq: Two times a day (BID) | ORAL | Status: DC
  Administered 2010-12-25 (×2): 120 mg via ORAL
  Filled 2010-12-25 (×2): qty 2

## 2010-12-25 MED ORDER — ACETAMINOPHEN 325 MG PO TABS: 650.0000 mg | ORAL_TABLET | Freq: Four times a day (QID) | ORAL | Status: DC | PRN

## 2010-12-25 MED ORDER — HYDROMORPHONE HCL 1 MG/ML IJ SOLN: 0.5000 mg | INTRAMUSCULAR | Status: DC | PRN

## 2010-12-25 MED ORDER — ONDANSETRON HCL 4 MG PO TABS: 4.0000 mg | ORAL_TABLET | Freq: Four times a day (QID) | ORAL | Status: DC | PRN

## 2010-12-25 MED ORDER — ENOXAPARIN SODIUM 40 MG/0.4ML ~~LOC~~ SOLN
40.0000 mg | Freq: Every day | SUBCUTANEOUS | Status: DC
  Filled 2010-12-25: qty 0.4

## 2010-12-25 MED ORDER — WARFARIN SODIUM 6 MG PO TABS: 6.0000 mg | ORAL_TABLET | ORAL | Status: DC

## 2010-12-25 MED ORDER — FLEET ENEMA 7-19 GM/118ML RE ENEM: 1.0000 | ENEMA | RECTAL | Status: DC | PRN

## 2010-12-25 MED ORDER — POLYETHYLENE GLYCOL 3350 17 G PO PACK: 17.0000 g | PACK | Freq: Every day | ORAL | Status: DC | PRN

## 2010-12-25 MED ORDER — RAMIPRIL 2.5 MG PO CAPS
10.0000 mg | ORAL_CAPSULE | Freq: Every day | ORAL | Status: DC
  Administered 2010-12-25: 10 mg via ORAL
  Filled 2010-12-25: qty 4

## 2010-12-25 MED ORDER — ACETAMINOPHEN 650 MG RE SUPP: 650.0000 mg | Freq: Four times a day (QID) | RECTAL | Status: DC | PRN

## 2010-12-25 MED ORDER — SODIUM CHLORIDE 0.9 % IJ SOLN
INTRAMUSCULAR | Status: AC
  Filled 2010-12-25: qty 10

## 2010-12-25 MED ORDER — PANTOPRAZOLE SODIUM 40 MG PO TBEC: 40.0000 mg | DELAYED_RELEASE_TABLET | Freq: Every day | ORAL | Status: DC

## 2010-12-25 MED ORDER — CITALOPRAM HYDROBROMIDE 20 MG PO TABS
10.0000 mg | ORAL_TABLET | Freq: Every day | ORAL | Status: DC
  Administered 2010-12-25: 10 mg via ORAL
  Filled 2010-12-25: qty 2

## 2010-12-25 MED ORDER — LEVOTHYROXINE SODIUM 100 MCG PO TABS
200.0000 ug | ORAL_TABLET | Freq: Every day | ORAL | Status: DC
  Administered 2010-12-25: 200 ug via ORAL
  Filled 2010-12-25: qty 2

## 2010-12-25 MED ORDER — DIVALPROEX SODIUM 250 MG PO DR TAB
1000.0000 mg | DELAYED_RELEASE_TABLET | Freq: Three times a day (TID) | ORAL | Status: DC
  Administered 2010-12-25: 1000 mg via ORAL
  Filled 2010-12-25: qty 4

## 2010-12-25 MED ORDER — OXYCODONE HCL 5 MG PO TABS: 5.0000 mg | ORAL_TABLET | ORAL | Status: DC | PRN

## 2010-12-25 MED ORDER — ONDANSETRON HCL 4 MG/2ML IJ SOLN: 4.0000 mg | Freq: Four times a day (QID) | INTRAMUSCULAR | Status: DC | PRN

## 2010-12-25 MED ORDER — TRAZODONE HCL 50 MG PO TABS: 25.0000 mg | ORAL_TABLET | Freq: Every evening | ORAL | Status: DC | PRN

## 2010-12-25 NOTE — Discharge Summary (Signed)
Physician Discharge Summary  Patient ID: [name] MRN: [id number] DOB/AGE: Dec 17, 1945 65 y.o. Primary Care Physician:SMITH,CANDACE THIELE, MD Admit date: 12/24/2010 Discharge date: 12/25/2010    Discharge Diagnoses:  1. Chest pain, myocardial ischemia/infarction excluded. 2. 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 Status post CABG. 3. Hypertension. 4. Hyperlipidemia. 5. Peripheral vascular disease. 6. Cerebrovascular disease with history of CVA. 7. Paroxysmal atrial fibrillation, chronic anticoagulation. 8. Type 2 diabetes mellitus. 9. History of colon cancer. 10. Anemia of unclear etiology.   Current Discharge Medication List    CONTINUE these medications which have NOT CHANGED   Details  ALPRAZolam (XANAX) 1 MG tablet Take 1 mg by mouth at bedtime as needed.      amLODipine (NORVASC) 10 MG tablet Take 10 mg by mouth daily.      aspirin 81 MG EC tablet Take 81 mg by mouth daily.      carbamazepine (TEGRETOL XR) 200 MG 12 hr tablet Take 200 mg by mouth 5 (five) times daily. 5x daily    citalopram (CELEXA) 10 MG tablet Take 10 mg by mouth daily.      divalproex (DEPAKOTE) 500 MG EC tablet Take 1,000 mg by mouth 3 (three) times daily.      fish oil-omega-3 fatty acids 1000 MG capsule Take 2 g by mouth daily.      furosemide (LASIX) 40 MG tablet Take 40 mg by mouth daily.      glipiZIDE (GLUCOTROL) 5 MG tablet Take 5 mg by mouth daily.      isosorbide mononitrate (IMDUR) 120 MG 24 hr tablet Take 120 mg by mouth 2 (two) times daily.      levothyroxine (SYNTHROID, LEVOTHROID) 200 MCG tablet Take 200 mcg by mouth daily.      loperamide (IMODIUM A-D) 2 MG tablet Take 2 mg by mouth 4 (four) times daily as needed.      metoprolol (LOPRESSOR) 100 MG tablet Take 100 mg by mouth 2 (two) times daily.      oxyCODONE-acetaminophen (PERCOCET) 5-325 MG per tablet Take 1 tablet by mouth every 4 (four) hours as needed.      pantoprazole (PROTONIX) 40 MG tablet Take 40  mg by mouth daily.      ramipril (ALTACE) 10 MG tablet Take 10 mg by mouth daily.      rosuvastatin (CRESTOR) 40 MG tablet Take 40 mg by mouth daily.      warfarin (COUMADIN) 6 MG tablet Take 6 mg by mouth as directed. 9 mg (One & One-half tablet) on Mondays and Thursdays. 6 mg (One Tablet) on all other days        Discharged Condition: Stable and improved.    Consults: None.  Significant Diagnostic Studies: Chest Portable 1 View  12/24/2010  *RADIOLOGY REPORT*  Clinical Data: Chest pain and shortness of breath.  Altered mental status.  Heart bypass surgery.  PORTABLE CHEST - 1 VIEW  Comparison: 11/18/2009  Findings: Patient has had median sternotomy and CABG.  Heart is enlarged.  There is pulmonary vascular congestion.  No overt alveolar edema.  There are no focal consolidations or pleural effusions.  Minimal bilateral lower lobe atelectasis.  IMPRESSION:  1.  Cardiomegaly and vascular congestion. 2.  Bibasilar atelectasis.  Original Report Authenticated By: Patterson Hammersmith, M.D.    Lab Results: Results for orders placed during the hospital encounter of 12/24/10 (from the past 48 hour(s))  CBC     Status: Abnormal   Collection Time   12/24/10  9:18 PM  Component Value Range Comment   WBC 3.8 (*) 4.0 - 10.5 (K/uL)    RBC 3.54 (*) 4.22 - 5.81 (MIL/uL)    Hemoglobin 10.9 (*) 13.0 - 17.0 (g/dL)    HCT 09.8 (*) 11.9 - 52.0 (%)    MCV 92.1  78.0 - 100.0 (fL)    MCH 30.8  26.0 - 34.0 (pg)    MCHC 33.4  30.0 - 36.0 (g/dL)    RDW 14.7  82.9 - 56.2 (%)    Platelets 174  150 - 400 (K/uL)   BASIC METABOLIC PANEL     Status: Abnormal   Collection Time   12/24/10  9:18 PM      Component Value Range Comment   Sodium 138  135 - 145 (mEq/L)    Potassium 3.8  3.5 - 5.1 (mEq/L)    Chloride 99  96 - 112 (mEq/L)    CO2 30  19 - 32 (mEq/L)    Glucose, Bld 136 (*) 70 - 99 (mg/dL)    BUN 19  6 - 23 (mg/dL)    Creatinine, Ser 1.30  0.50 - 1.35 (mg/dL)    Calcium 8.3 (*) 8.4 - 10.5 (mg/dL)      GFR calc non Af Amer >60  >60 (mL/min)    GFR calc Af Amer >60  >60 (mL/min)   PROTIME-INR     Status: Abnormal   Collection Time   12/24/10  9:18 PM      Component Value Range Comment   Prothrombin Time 23.1 (*) 11.6 - 15.2 (seconds)    INR 2.01 (*) 0.00 - 1.49    CARDIAC PANEL(CRET KIN+CKTOT+MB+TROPI)     Status: Abnormal   Collection Time   12/24/10  9:18 PM      Component Value Range Comment   Total CK 115  7 - 232 (U/L)    CK, MB 3.2  0.3 - 4.0 (ng/mL)    Troponin I <0.30  <0.30 (ng/mL)    Relative Index 2.8 (*) 0.0 - 2.5    HEPATIC FUNCTION PANEL     Status: Abnormal   Collection Time   12/25/10  2:30 AM      Component Value Range Comment   Total Protein 7.5  6.0 - 8.3 (g/dL)    Albumin 3.1 (*) 3.5 - 5.2 (g/dL)    AST 12  0 - 37 (U/L)    ALT 7  0 - 53 (U/L)    Alkaline Phosphatase 44  39 - 117 (U/L)    Total Bilirubin 0.3  0.3 - 1.2 (mg/dL)    Bilirubin, Direct 0.1  0.0 - 0.3 (mg/dL)    Indirect Bilirubin 0.2 (*) 0.3 - 0.9 (mg/dL)   RETICULOCYTES     Status: Abnormal   Collection Time   12/25/10  2:30 AM      Component Value Range Comment   Retic Ct Pct 1.7  0.4 - 3.1 (%)    RBC. 3.56 (*) 4.22 - 5.81 (MIL/uL)    Retic Count, Manual 60.5  19.0 - 186.0 (K/uL)   VALPROIC ACID LEVEL     Status: Normal   Collection Time   12/25/10  2:30 AM      Component Value Range Comment   Valproic Acid Lvl 64.0  50.0 - 100.0 (ug/mL)   CBC     Status: Abnormal   Collection Time   12/25/10  5:18 AM      Component Value Range Comment   WBC 4.3  4.0 - 10.5 (  K/uL)    RBC 3.58 (*) 4.22 - 5.81 (MIL/uL)    Hemoglobin 10.9 (*) 13.0 - 17.0 (g/dL)    HCT 16.1 (*) 09.6 - 52.0 (%)    MCV 93.0  78.0 - 100.0 (fL)    MCH 30.4  26.0 - 34.0 (pg)    MCHC 32.7  30.0 - 36.0 (g/dL)    RDW 04.5  40.9 - 81.1 (%)    Platelets 180  150 - 400 (K/uL)   PROTIME-INR     Status: Abnormal   Collection Time   12/25/10  5:18 AM      Component Value Range Comment   Prothrombin Time 23.2 (*) 11.6 - 15.2 (seconds)     INR 2.02 (*) 0.00 - 1.49    BASIC METABOLIC PANEL     Status: Abnormal   Collection Time   12/25/10  5:18 AM      Component Value Range Comment   Sodium 140  135 - 145 (mEq/L)    Potassium 3.9  3.5 - 5.1 (mEq/L)    Chloride 100  96 - 112 (mEq/L)    CO2 31  19 - 32 (mEq/L)    Glucose, Bld 115 (*) 70 - 99 (mg/dL)    BUN 15  6 - 23 (mg/dL)    Creatinine, Ser 9.14  0.50 - 1.35 (mg/dL)    Calcium 8.1 (*) 8.4 - 10.5 (mg/dL)    GFR calc non Af Amer >60  >60 (mL/min)    GFR calc Af Amer >60  >60 (mL/min)   CARDIAC PANEL(CRET KIN+CKTOT+MB+TROPI)     Status: Normal   Collection Time   12/25/10  5:19 AM      Component Value Range Comment   Total CK 91  7 - 232 (U/L)    CK, MB 3.1  0.3 - 4.0 (ng/mL)    Troponin I <0.30  <0.30 (ng/mL)    Relative Index RELATIVE INDEX IS INVALID  0.0 - 2.5    GLUCOSE, CAPILLARY     Status: Normal   Collection Time   12/25/10  7:44 AM      Component Value Range Comment   Glucose-Capillary 91  70 - 99 (mg/dL)    Comment 1 Notify RN      Comment 2 Documented in Chart     URINALYSIS, ROUTINE W REFLEX MICROSCOPIC     Status: Abnormal   Collection Time   12/25/10  9:10 AM      Component Value Range Comment   Color, Urine YELLOW  YELLOW     Appearance CLEAR  CLEAR     Specific Gravity, Urine >1.030 (*) 1.005 - 1.030     pH 6.5  5.0 - 8.0     Glucose, UA NEGATIVE  NEGATIVE (mg/dL)    Hgb urine dipstick NEGATIVE  NEGATIVE     Bilirubin Urine SMALL (*) NEGATIVE     Ketones, ur NEGATIVE  NEGATIVE (mg/dL)    Protein, ur 782 (*) NEGATIVE (mg/dL)    Urobilinogen, UA 2.0 (*) 0.0 - 1.0 (mg/dL)    Nitrite NEGATIVE  NEGATIVE     Leukocytes, UA NEGATIVE  NEGATIVE    URINE MICROSCOPIC-ADD ON     Status: Normal   Collection Time   12/25/10  9:10 AM      Component Value Range Comment   WBC, UA 0-2  <3 (WBC/hpf)    RBC / HPF 0-2  <3 (RBC/hpf)    No results found for this or any previous visit (from  the past 240 hour(s)).   Hospital Course: This 65 year old man was admitted  with chest pain. Please see initial history and physical examination done by dip Dr. Vania Rea. He was admitted yesterday. He is symptoms have resolved today. He has minimal chest pain which is a chronic issue for him. Serial cardiac enzymes have been negative. He denies any dyspnea, palpitations or cough. There has not been any fever.  Discharge Exam: Blood pressure 138/65, pulse 58, temperature 98.5 F (36.9 C), temperature source Oral, resp. rate 18, height 5\' 9"  (1.753 m), weight 113.399 kg (250 lb), SpO2 98.00%. He looks systemically well. Heart sounds are present and normal. There is no gallop rhythm. Jugular venous pressure not raised. Lung fields are clear. Abdomen is soft and nontender. He is alert and orientated and responds appropriately to questions.  Disposition: Home.  Discharge Orders    Future Appointments: Provider: Department: Dept Phone: Center:   02/15/2011 1:20 PM Jonelle Sidle, MD [facility] [phone] [facility]     Future Orders Please Complete By Expires   Diet - low sodium heart healthy      Increase activity slowly      Discharge instructions      Comments:   You are  anemic. You will need a repeat colonoscopy. Please ask your family doctor to refer you to a gastroenterologist.        Signed: Wilson Singer 12/25/2010, 10:24 AM

## 2010-12-25 NOTE — Progress Notes (Signed)
ANTICOAGULATION CONSULT NOTE - Initial Consult  Pharmacy Consult for  Warfarin (Coumadin brand dispensed in hospital) Indication: atrial fibrillation (continuation of home dose)  No Known Allergies  Patient Measurements: Height: 5\' 9"  (175.3 cm) Weight: 250 lb (113.399 kg) IBW/kg (Calculated) : 70.7  Adjusted Body Weight: n/a  Vital Signs: Temp: 98.5 F (36.9 C) (09/07 0600) Temp src: Oral (09/07 0600) BP: 138/65 mmHg (09/07 0600) Pulse Rate: 58  (09/07 0600)  Labs:  Basename 12/25/10 0519 12/25/10 0518 12/24/10 2118  HGB -- 10.9* 10.9*  HCT -- 33.3* 32.6*  PLT -- 180 174  APTT -- -- --  LABPROT -- 23.2* 23.1*  INR -- 2.02* 2.01*  HEPARINUNFRC -- -- --  CREATININE -- 0.52 0.55  CRCLEARANCE -- -- --  CKTOTAL 91 -- 115  CKMB 3.1 -- 3.2  TROPONINI <0.30 -- <0.30    Medical History: Past Medical History  Diagnosis Date  . PVD (peripheral vascular disease)     Remote patch angioplasty of the left femoral, prior L CEA and prior extensive lower extremity surgeries.  . Diabetes mellitus   . Hyperlipidemia   . Lower extremity pain   . Obesity   . Seizure disorder   . OSA (obstructive sleep apnea)   . History of colon cancer 2007  . CVA (cerebrovascular accident) Dec 2004    Persistent residual expressive aphasia  . IHD (ischemic heart disease)   . Chest discomfort     Chronic; On chronic narcotics. Narcotics are managed by Dr. Sherril Croon  . Cervical spine disease   . Hx of CABG   . Abnormal nuclear cardiac imaging test July 2011    Consistent with Cath/CABG data  . Iron deficiency anemia     Managed by Dr. Myna Hidalgo  . Chronic anticoagulation   . PAF (paroxysmal atrial fibrillation)     Medications:   Home dose: 9 mg on Mondays and Thursdays. 6mg  all other days. Last dose taken was on 12/24/10.  Assessment:  Therapeutic INR.  Continuation of home therapy for Afib.  Goal of Therapy:  INR 2-3   Plan:   Warfarin 6mg  today.  Daily INR for now.  [name] 12/25/2010,8:14 AM

## 2010-12-25 NOTE — H&P (Signed)
PCP:   Allean Found, MD   Chief Complaint:  Chest pains and shortness of breath for 3 days  HPI: 65 year old Caucasian gentleman with a history of extensive vascular disease, status post CABG, status post femoropopliteal bypass, paroxysmal atrial fibrillation status post stroke with residual aphasia, on chronic Coumadin anti-coagulation. Patient apparently has chronic chest pain syndrome treated with chronic narcotic lots as been feeling much weaker and having worsening chest pain for the past 3 days and was brought in for evaluation by his family.  In the emergency room patient apparently been back at his baseline but the family feel he needs to be admitted for further evaluation so hospitalist service was called. At the time of this interview family of origin left and patient is unable to contribute further to the history and she collar is able to deny any acute problems. He seems somewhat more lethargic than those described in his old records.  There is no history of fever cough or cold nausea or vomiting diarrhea or melena.  His hemoglobin was noted to be greater than 13, 6 months ago, it is currently around 10.  Review of Systems:  The patient denies anorexia, fever, weight loss,, vision loss, decreased hearing, hoarseness, syncope, dyspnea on exertion, peripheral edema, balance deficits, hemoptysis, abdominal pain, melena, hematochezia, severe indigestion/heartburn, hematuria, incontinence, genital sores, muscle weakness, suspicious skin lesions, transient blindness, difficulty walking, depression, unusual weight change, abnormal bleeding, enlarged lymph nodes, angioedema, and breast masses.  Past Medical History: Past Medical History  Diagnosis Date  . PVD (peripheral vascular disease)     Remote patch angioplasty of the left femoral, prior L CEA and prior extensive lower extremity surgeries.  . Diabetes mellitus   . Hyperlipidemia   . Lower extremity pain   . Obesity   .  Seizure disorder   . OSA (obstructive sleep apnea)   . History of colon cancer 2007  . CVA (cerebrovascular accident) Dec 2004    Persistent residual expressive aphasia  . IHD (ischemic heart disease)   . Chest discomfort     Chronic; On chronic narcotics. Narcotics are managed by Dr. Sherril Croon  . Cervical spine disease   . Hx of CABG   . Abnormal nuclear cardiac imaging test July 2011    Consistent with Cath/CABG data  . Iron deficiency anemia     Managed by Dr. Myna Hidalgo  . Chronic anticoagulation   . PAF (paroxysmal atrial fibrillation)    Past Surgical History  Procedure Date  . Cardiac catheterization 06/06/2006  . Carotid endarterectomy   . Low extremity surgery   . Colon surgery   . Coronary artery bypass graft 05/2006    X4  . Coronary angioplasty 1990    Medications: Prior to Admission medications   Medication Sig Start Date End Date Taking? Authorizing Provider  ALPRAZolam Prudy Feeler) 1 MG tablet Take 1 mg by mouth at bedtime as needed.     Yes Historical Provider, MD  amLODipine (NORVASC) 10 MG tablet Take 10 mg by mouth daily.     Yes Historical Provider, MD  aspirin 81 MG EC tablet Take 81 mg by mouth daily.     Yes Historical Provider, MD  carbamazepine (TEGRETOL XR) 200 MG 12 hr tablet Take 200 mg by mouth 5 (five) times daily. 5x daily   Yes Historical Provider, MD  citalopram (CELEXA) 10 MG tablet Take 10 mg by mouth daily.     Yes Historical Provider, MD  divalproex (DEPAKOTE) 500 MG EC tablet Take 1,000  mg by mouth 3 (three) times daily.     Yes Historical Provider, MD  fish oil-omega-3 fatty acids 1000 MG capsule Take 2 g by mouth daily.     Yes Historical Provider, MD  furosemide (LASIX) 40 MG tablet Take 40 mg by mouth daily.     Yes Historical Provider, MD  glipiZIDE (GLUCOTROL) 5 MG tablet Take 5 mg by mouth daily.     Yes Historical Provider, MD  isosorbide mononitrate (IMDUR) 120 MG 24 hr tablet Take 120 mg by mouth 2 (two) times daily.     Yes Historical  Provider, MD  levothyroxine (SYNTHROID, LEVOTHROID) 200 MCG tablet Take 200 mcg by mouth daily.     Yes Historical Provider, MD  loperamide (IMODIUM A-D) 2 MG tablet Take 2 mg by mouth 4 (four) times daily as needed.     Yes Historical Provider, MD  metoprolol (LOPRESSOR) 100 MG tablet Take 100 mg by mouth 2 (two) times daily.     Yes Historical Provider, MD  oxyCODONE-acetaminophen (PERCOCET) 5-325 MG per tablet Take 1 tablet by mouth every 4 (four) hours as needed.     Yes Historical Provider, MD  pantoprazole (PROTONIX) 40 MG tablet Take 40 mg by mouth daily.     Yes Historical Provider, MD  ramipril (ALTACE) 10 MG tablet Take 10 mg by mouth daily.     Yes Historical Provider, MD  rosuvastatin (CRESTOR) 40 MG tablet Take 40 mg by mouth daily.     Yes Historical Provider, MD  warfarin (COUMADIN) 6 MG tablet Take 6 mg by mouth as directed. 9 mg (One & One-half tablet) on Mondays and Thursdays. 6 mg (One Tablet) on all other days   Yes Historical Provider, MD  ALPRAZolam (XANAX PO) Take 1 mg by mouth as needed.     Historical Provider, MD  Divalproex Sodium (DEPAKOTE PO) Take by mouth daily. 6X DAILY     Historical Provider, MD    Allergies:  No Known Allergies  Social History:  reports that he quit smoking about 20 years ago. His smoking use included Cigarettes. He has a 25 pack-year smoking history. He has never used smokeless tobacco. He reports that he does not drink alcohol. His drug history not on file.  Family History: Family History  Problem Relation Age of Onset  . Diabetes Mother     Physical Exam: Filed Vitals:   12/24/10 2055 12/24/10 2224 12/24/10 2357 12/25/10 0100  BP: 196/75 165/64 157/60 152/64  Pulse: 53 51 53 54  Temp: 98.5 F (36.9 C)   98.7 F (37.1 C)  TempSrc: Oral   Oral  Resp: 17 13 14 16   Height: 5\' 9"  (1.753 m)   5\' 9"  (1.753 m)  Weight: 113.399 kg (250 lb)   113.399 kg (250 lb)  SpO2:  99% 96% 98%   General appearance: moderately obese and slowed  mentation, dysphasic. Head: Normocephalic, without obvious abnormality, atraumatic Eyes: conjunctivae/corneas clear. PERL Throat: lips, mucosa,  normal Neck: thick neck no adenopathy, no carotid bruit, no JVD, supple, symmetrical, trachea midline and thyroid not enlarged, symmetric, no tenderness/mass/nodules Resp: clear to auscultation bilaterally Chest wall: no tenderness Cardio: regular rate and rhythm, S1, S2 normal, no murmur, click, rub or gallop GI: obese; soft, non-tender; bowel sounds normal; no masses,  no organomegaly Extremities: edema 2+, bilaterally Pulses: 2+ and symmetric Skin: Skin color, texture, turgor normal. No rashes or lesions   Labs on Admission:   [facility] 12/24/10 2118  NA 138  K  3.8  CL 99  CO2 30  GLUCOSE 136*  BUN 19  CREATININE 0.55  CALCIUM 8.3*  MG --  PHOS --    Basename 12/25/10 0230  AST 12  ALT 7  ALKPHOS 44  BILITOT 0.3  PROT 7.5  ALBUMIN 3.1*     Basename 12/24/10 2118  WBC 3.8*  NEUTROABS --  HGB 10.9*  HCT 32.6*  MCV 92.1  PLT 174    Basename 12/24/10 2118  CKTOTAL 115  CKMB 3.2  CKMBINDEX --  TROPONINI <0.30   No results found for this basename: TSH,T4TOTAL,FREET3,T3FREE,THYROIDAB in the last 72 hours  Basename 12/25/10 0230  VITAMINB12 --  FOLATE --  FERRITIN --  TIBC --  IRON --  RETICCTPCT 1.7    Radiological Exams on Admission: Chest Portable 1 View  12/24/2010  *RADIOLOGY REPORT*  Clinical Data: Chest pain and shortness of breath.  Altered mental status.  Heart bypass surgery.  PORTABLE CHEST - 1 VIEW  Comparison: 11/18/2009  Findings: Patient has had median sternotomy and CABG.  Heart is enlarged.  There is pulmonary vascular congestion.  No overt alveolar edema.  There are no focal consolidations or pleural effusions.  Minimal bilateral lower lobe atelectasis.  IMPRESSION:  1.  Cardiomegaly and vascular congestion. 2.  Bibasilar atelectasis.  Original Report Authenticated By: Patterson Hammersmith, M.D.     Assessment/Plan Present on Admission:  .CAD (coronary artery disease) .HTN (hypertension) .Hyperlipidemia Anemia DM2  PLAN: We'll bring this gentleman on observation of acute conus syndrome workup. We'll continue his home medications while in hospital. Because of the unexplained drop in his hemoglobin will get anemia panel.He does have a history of colon cancer per the chart, and this is apparently being followed as an outpatient; we do note his anemia is not microcytic.  Other plans as per orders  Bryden Darden 12/25/2010, 3:41 AM

## 2010-12-26 LAB — URINE CULTURE
Culture  Setup Time: 201209080057
Culture: NO GROWTH

## 2010-12-26 LAB — FERRITIN: Ferritin: 89 ng/mL (ref 22–322)

## 2010-12-26 LAB — FOLATE: Folate: >20 ng/mL

## 2010-12-30 LAB — CARBAMAZEPINE, FREE AND TOTAL
Carbamazepine Metabolite -: 6.3 ug/mL — ABNORMAL HIGH (ref 0.2–2.0)
Carbamazepine Metabolite: 4.2 ug/mL — ABNORMAL HIGH (ref 0.1–1.0)
Carbamazepine, Total: 9 ug/mL (ref 4.0–12.0)

## 2010-12-30 NOTE — Progress Notes (Signed)
Encounter addended by: Clarene Critchley on: 12/30/2010  2:53 PM<BR>     Documentation filed: Flowsheet VN

## 2011-02-14 ENCOUNTER — Encounter: Payer: Self-pay | Admitting: Cardiology

## 2011-02-15 ENCOUNTER — Encounter: Payer: Self-pay | Admitting: Cardiology

## 2011-02-15 ENCOUNTER — Ambulatory Visit (INDEPENDENT_AMBULATORY_CARE_PROVIDER_SITE_OTHER): Payer: Medicare Other | Admitting: Cardiology

## 2011-02-15 VITALS — BP 113/71 | HR 66 | Resp 18 | Ht 69.0 in | Wt 228.0 lb

## 2011-02-15 DIAGNOSIS — G4733 Obstructive sleep apnea (adult) (pediatric): Secondary | ICD-10-CM | POA: Insufficient documentation

## 2011-02-15 DIAGNOSIS — G4730 Sleep apnea, unspecified: Secondary | ICD-10-CM

## 2011-02-15 DIAGNOSIS — I2510 Atherosclerotic heart disease of native coronary artery without angina pectoris: Secondary | ICD-10-CM

## 2011-02-15 DIAGNOSIS — E785 Hyperlipidemia, unspecified: Secondary | ICD-10-CM

## 2011-02-15 DIAGNOSIS — I480 Paroxysmal atrial fibrillation: Secondary | ICD-10-CM | POA: Insufficient documentation

## 2011-02-15 DIAGNOSIS — I10 Essential (primary) hypertension: Secondary | ICD-10-CM

## 2011-02-15 DIAGNOSIS — I4891 Unspecified atrial fibrillation: Secondary | ICD-10-CM

## 2011-02-15 NOTE — Patient Instructions (Signed)
Your physician wants you to follow-up in: 4 months. You will receive a reminder letter in the mail one-two months in advance. If you don't receive a letter, please call our office to schedule the follow-up appointment. Referral to Pulmonary. Your physician recommends that you continue on your current medications as directed. Please refer to the Current Medication list given to you today.

## 2011-02-15 NOTE — Assessment & Plan Note (Signed)
Symptomatically stable on medical therapy. Last noninvasive stress test was in July of 2011 showing evidence of inferior apical scar, no large areas of ischemia. Plan to continue observation for now.

## 2011-02-15 NOTE — Assessment & Plan Note (Signed)
Paroxysmal, remains on Coumadin with prior history of stroke and expressive aphasia.

## 2011-02-15 NOTE — Assessment & Plan Note (Signed)
Seems to be symptomatic based on wife's discussion, and he has not been compliant with CPAP. He states that he is uncomfortable on the machine. Referral being made to our Pulmonary Division for reassessment and possibly titration.

## 2011-02-15 NOTE — Assessment & Plan Note (Signed)
Recent lipid numbers reviewed, LDL at goal. 

## 2011-02-15 NOTE — Progress Notes (Signed)
Clinical Summary [name] is a 65 y.o.male presenting for followup. This by first meeting with him in the office. He is a former long-term patient of Dr. Deborah Pacheco, medically complex as outlined below. He is here with his wife.  Remarkably, he has remained fairly stable from the perspective of his cardiac status. As noted in the prior office visit in June with Ms. [name], he has chronic chest pain, and is on narcotics that are managed by Dr. Sherril Pacheco. This does not seem to have progressed or changed much recently.  Recent PAD bypass assessment in May with Dr. Hart Pacheco showed patency of the right femoral to tibial peroneal bypass as well as left mid SFA to posterior tibial bypass. ABIs were 1.04 on the right and 0.99 on the left.  Lab work from September showed hemoglobin 10.9, INR 2.0, potassium 3.9, BUN 15, creatinine 0.5. Lab work this month showed LDL 73, triglycerides 161, HDL 29, cholesterol 134. He reports compliance with medications.  His wife states that he has not been using CPAP, and is sometimes "confused" if he gets up at night time. He has not had any followup testing for his sleep apnea over time.  He has had occasional falls, not related to syncope however.   No Known Allergies  Medication list reviewed.  Past Medical History  Diagnosis Date  . PVD (peripheral vascular disease)     Severe lower extremitiy  . Type 2 diabetes mellitus   . Mixed hyperlipidemia   . Obesity   . Seizure disorder   . OSA (obstructive sleep apnea)   . History of colon cancer 2007  . CVA (cerebrovascular accident) 2004    Persistent residual expressive aphasia  . Coronary atherosclerosis of native coronary artery     Multivessel  . Chest discomfort     Chronic on narcotics - Dr. Sherril Pacheco  . Cervical spine disease   . Iron deficiency anemia     Managed by Dr. Myna Pacheco  . PAF (paroxysmal atrial fibrillation)   . Carotid artery disease   . GERD (gastroesophageal reflux disease)     Past Surgical  History  Procedure Date  . Carotid endarterectomy   . Left fem-pop bypass   . Partial colectomy   . Coronary artery bypass graft 05/2006    LIMA to diagonal, RIMA to LAD, radial to OM, radial to PDA  . Left shoulder surgery   . Tonsillectomy   . Cholecystectomy   . Adenoidectomy   . Right fem-pop bypass     Family History  Problem Relation Age of Onset  . Diabetes Mother     Social History [name] reports that he quit smoking about 20 years ago. His smoking use included Cigarettes. He has a 25 pack-year smoking history. He has never used smokeless tobacco. [name] reports that he does not drink alcohol.  Review of Systems As outlined above, otherwise negative.  Physical Examination Filed Vitals:   02/15/11 1317  BP: 113/71  Pulse: 66  Resp: 18   Overweight, chronically ill-appearing male, in no acute distress. HEENT: Conjunctiva and lids normal, oropharynx is moist it disappeared Neck: Supple, no elevated JVP, left-sided scar from endarterectomy, soft bruits. Lungs: Diminished breath sounds, nonlabored. Cardiac: Regular rate and rhythm, no S3 gallop or rub. Thorax: Well-healed midline sternal incision. Abdomen: Soft, nontender, bowel sounds present. Skin: Warm and dry. Extremities: Trace edema below the knees, symmetrical, distal pulses diminished. Scars from prior bilateral radial artery harvest noted. Neuropsychiatric: Alert and oriented x3, expressive aphasia.  Problem List and Plan

## 2011-02-15 NOTE — Assessment & Plan Note (Signed)
Blood pressure well-controlled today. 

## 2011-02-17 ENCOUNTER — Telehealth: Payer: Self-pay | Admitting: *Deleted

## 2011-02-17 NOTE — Telephone Encounter (Signed)
Pt scheduled w/Dr. Orson Aloe per recent OV. His first available appt is Apr 09, 2011 at 2:15 pm.   Pt's wife aware and verbalized understanding.

## 2011-11-11 ENCOUNTER — Encounter: Payer: Self-pay | Admitting: Vascular Surgery

## 2011-12-21 ENCOUNTER — Emergency Department (HOSPITAL_COMMUNITY): Payer: Medicare Other

## 2011-12-21 ENCOUNTER — Encounter (HOSPITAL_COMMUNITY): Payer: Self-pay | Admitting: Emergency Medicine

## 2011-12-21 ENCOUNTER — Inpatient Hospital Stay (HOSPITAL_COMMUNITY)
Admission: EM | Admit: 2011-12-21 | Discharge: 2011-12-23 | DRG: 313 | Disposition: A | Discharge status: Skilled Nursing Facility | Payer: Medicare Other | Attending: Cardiology | Admitting: Cardiology

## 2011-12-21 DIAGNOSIS — I69920 Aphasia following unspecified cerebrovascular disease: Secondary | ICD-10-CM

## 2011-12-21 DIAGNOSIS — I2510 Atherosclerotic heart disease of native coronary artery without angina pectoris: Secondary | ICD-10-CM | POA: Diagnosis present

## 2011-12-21 DIAGNOSIS — I10 Essential (primary) hypertension: Secondary | ICD-10-CM | POA: Diagnosis present

## 2011-12-21 DIAGNOSIS — R0789 Other chest pain: Principal | ICD-10-CM | POA: Diagnosis present

## 2011-12-21 DIAGNOSIS — Z85038 Personal history of other malignant neoplasm of large intestine: Secondary | ICD-10-CM

## 2011-12-21 DIAGNOSIS — I509 Heart failure, unspecified: Secondary | ICD-10-CM | POA: Diagnosis present

## 2011-12-21 DIAGNOSIS — I6529 Occlusion and stenosis of unspecified carotid artery: Secondary | ICD-10-CM | POA: Diagnosis present

## 2011-12-21 DIAGNOSIS — Z79899 Other long term (current) drug therapy: Secondary | ICD-10-CM

## 2011-12-21 DIAGNOSIS — Z951 Presence of aortocoronary bypass graft: Secondary | ICD-10-CM

## 2011-12-21 DIAGNOSIS — E782 Mixed hyperlipidemia: Secondary | ICD-10-CM | POA: Diagnosis present

## 2011-12-21 DIAGNOSIS — G40909 Epilepsy, unspecified, not intractable, without status epilepticus: Secondary | ICD-10-CM | POA: Diagnosis present

## 2011-12-21 DIAGNOSIS — E669 Obesity, unspecified: Secondary | ICD-10-CM | POA: Diagnosis present

## 2011-12-21 DIAGNOSIS — I739 Peripheral vascular disease, unspecified: Secondary | ICD-10-CM | POA: Diagnosis present

## 2011-12-21 DIAGNOSIS — R079 Chest pain, unspecified: Secondary | ICD-10-CM | POA: Diagnosis present

## 2011-12-21 DIAGNOSIS — Z87891 Personal history of nicotine dependence: Secondary | ICD-10-CM

## 2011-12-21 DIAGNOSIS — I4891 Unspecified atrial fibrillation: Secondary | ICD-10-CM | POA: Diagnosis present

## 2011-12-21 DIAGNOSIS — G4733 Obstructive sleep apnea (adult) (pediatric): Secondary | ICD-10-CM | POA: Diagnosis present

## 2011-12-21 DIAGNOSIS — Z7901 Long term (current) use of anticoagulants: Secondary | ICD-10-CM

## 2011-12-21 DIAGNOSIS — E119 Type 2 diabetes mellitus without complications: Secondary | ICD-10-CM | POA: Diagnosis present

## 2011-12-21 DIAGNOSIS — I498 Other specified cardiac arrhythmias: Secondary | ICD-10-CM | POA: Diagnosis present

## 2011-12-21 DIAGNOSIS — I252 Old myocardial infarction: Secondary | ICD-10-CM

## 2011-12-21 DIAGNOSIS — D509 Iron deficiency anemia, unspecified: Secondary | ICD-10-CM | POA: Diagnosis present

## 2011-12-21 DIAGNOSIS — K219 Gastro-esophageal reflux disease without esophagitis: Secondary | ICD-10-CM | POA: Diagnosis present

## 2011-12-21 HISTORY — DX: Malignant neoplasm of colon, unspecified: C18.9

## 2011-12-21 HISTORY — DX: Acute myocardial infarction, unspecified: I21.9

## 2011-12-21 LAB — CBC WITH DIFFERENTIAL/PLATELET
Basophils Relative: 1 % (ref 0–1)
Eosinophils Absolute: 0.1 10*3/uL (ref 0.0–0.7)
Lymphs Abs: 1.4 10*3/uL (ref 0.7–4.0)
MCH: 30.4 pg (ref 26.0–34.0)
MCHC: 34.3 g/dL (ref 30.0–36.0)
Neutrophils Relative %: 46 % (ref 43–77)
Platelets: 171 10*3/uL (ref 150–400)
RBC: 3.68 MIL/uL — ABNORMAL LOW (ref 4.22–5.81)

## 2011-12-21 LAB — URINALYSIS, ROUTINE W REFLEX MICROSCOPIC
Ketones, ur: NEGATIVE mg/dL
Leukocytes, UA: NEGATIVE
Nitrite: NEGATIVE
Protein, ur: NEGATIVE mg/dL

## 2011-12-21 LAB — BASIC METABOLIC PANEL
GFR calc Af Amer: >90 mL/min (ref >90)
GFR calc non Af Amer: >90 mL/min (ref >90)
Potassium: 4.5 mEq/L (ref 3.5–5.1)
Sodium: 133 mEq/L — ABNORMAL LOW (ref 135–145)

## 2011-12-21 LAB — CBC
HCT: 34.6 % — ABNORMAL LOW (ref 39.0–52.0)
Hemoglobin: 11.8 g/dL — ABNORMAL LOW (ref 13.0–17.0)
MCH: 30.5 pg (ref 26.0–34.0)
MCV: 89.4 fL (ref 78.0–100.0)
RBC: 3.87 MIL/uL — ABNORMAL LOW (ref 4.22–5.81)
WBC: 5 10*3/uL (ref 4.0–10.5)

## 2011-12-21 LAB — MRSA PCR SCREENING: MRSA by PCR: NEGATIVE

## 2011-12-21 LAB — PRO B NATRIURETIC PEPTIDE: Pro B Natriuretic peptide (BNP): 504.3 pg/mL — ABNORMAL HIGH (ref 0–125)

## 2011-12-21 LAB — CREATININE, SERUM: GFR calc Af Amer: >90 mL/min (ref >90)

## 2011-12-21 LAB — PROTIME-INR: INR: 1.19 (ref 0.00–1.49)

## 2011-12-21 MED ORDER — LEVOTHYROXINE SODIUM 200 MCG PO TABS
200.0000 ug | ORAL_TABLET | Freq: Every day | ORAL | Status: DC
  Filled 2011-12-21: qty 1

## 2011-12-21 MED ORDER — SODIUM CHLORIDE 0.9 % IJ SOLN: 3.0000 mL | INTRAMUSCULAR | Status: DC | PRN

## 2011-12-21 MED ORDER — DIVALPROEX SODIUM 500 MG PO DR TAB
1000.0000 mg | DELAYED_RELEASE_TABLET | Freq: Three times a day (TID) | ORAL | Status: DC
  Administered 2011-12-21 – 2011-12-23 (×4): 1000 mg via ORAL
  Filled 2011-12-21 (×7): qty 2

## 2011-12-21 MED ORDER — LEVOTHYROXINE SODIUM 50 MCG PO TABS: 50.0000 ug | ORAL_TABLET | ORAL | Status: DC

## 2011-12-21 MED ORDER — SODIUM CHLORIDE 0.9 % IJ SOLN
3.0000 mL | Freq: Two times a day (BID) | INTRAMUSCULAR | Status: DC
  Administered 2011-12-21 – 2011-12-23 (×3): 3 mL via INTRAVENOUS

## 2011-12-21 MED ORDER — ISOSORBIDE MONONITRATE ER 60 MG PO TB24
120.0000 mg | ORAL_TABLET | Freq: Every day | ORAL | Status: DC
  Administered 2011-12-21 – 2011-12-23 (×3): 120 mg via ORAL
  Filled 2011-12-21 (×3): qty 2

## 2011-12-21 MED ORDER — ALPRAZOLAM 0.25 MG PO TABS
0.2500 mg | ORAL_TABLET | Freq: Two times a day (BID) | ORAL | Status: DC | PRN
  Filled 2011-12-21 (×2): qty 1

## 2011-12-21 MED ORDER — WARFARIN - PHARMACIST DOSING INPATIENT: Freq: Every day | Status: DC

## 2011-12-21 MED ORDER — POTASSIUM CHLORIDE CRYS ER 20 MEQ PO TBCR
20.0000 meq | EXTENDED_RELEASE_TABLET | Freq: Every day | ORAL | Status: DC
  Administered 2011-12-22 – 2011-12-23 (×2): 20 meq via ORAL
  Filled 2011-12-21 (×2): qty 1

## 2011-12-21 MED ORDER — OMEGA-3-ACID ETHYL ESTERS 1 G PO CAPS
1.0000 g | ORAL_CAPSULE | Freq: Two times a day (BID) | ORAL | Status: DC
  Administered 2011-12-21 – 2011-12-23 (×4): 1 g via ORAL
  Filled 2011-12-21 (×5): qty 1

## 2011-12-21 MED ORDER — CARBAMAZEPINE ER 200 MG PO TB12
200.0000 mg | ORAL_TABLET | Freq: Two times a day (BID) | ORAL | Status: AC
  Administered 2011-12-21 – 2011-12-22 (×2): 200 mg via ORAL
  Filled 2011-12-21 (×3): qty 1

## 2011-12-21 MED ORDER — CARBAMAZEPINE ER 200 MG PO CP12: 200.0000 mg | ORAL_CAPSULE | Freq: Every day | ORAL | Status: DC

## 2011-12-21 MED ORDER — AMLODIPINE BESYLATE 10 MG PO TABS
10.0000 mg | ORAL_TABLET | Freq: Every day | ORAL | Status: DC
  Administered 2011-12-21 – 2011-12-23 (×3): 10 mg via ORAL
  Filled 2011-12-21 (×3): qty 1

## 2011-12-21 MED ORDER — ALPRAZOLAM 0.5 MG PO TABS
1.0000 mg | ORAL_TABLET | Freq: Three times a day (TID) | ORAL | Status: DC
  Administered 2011-12-21 – 2011-12-23 (×3): 1 mg via ORAL
  Filled 2011-12-21 (×3): qty 2

## 2011-12-21 MED ORDER — ENOXAPARIN SODIUM 40 MG/0.4ML ~~LOC~~ SOLN
40.0000 mg | SUBCUTANEOUS | Status: DC
  Administered 2011-12-21 – 2011-12-22 (×2): 40 mg via SUBCUTANEOUS
  Filled 2011-12-21 (×3): qty 0.4

## 2011-12-21 MED ORDER — NITROGLYCERIN 0.4 MG SL SUBL: 0.4000 mg | SUBLINGUAL_TABLET | SUBLINGUAL | Status: DC | PRN

## 2011-12-21 MED ORDER — SODIUM CHLORIDE 0.9 % IV SOLN: 250.0000 mL | INTRAVENOUS | Status: DC | PRN

## 2011-12-21 MED ORDER — MIRTAZAPINE 7.5 MG PO TABS
7.5000 mg | ORAL_TABLET | Freq: Every day | ORAL | Status: DC
  Administered 2011-12-21 – 2011-12-22 (×2): 7.5 mg via ORAL
  Filled 2011-12-21 (×3): qty 1

## 2011-12-21 MED ORDER — FUROSEMIDE 10 MG/ML IJ SOLN: 40.0000 mg | Freq: Two times a day (BID) | INTRAMUSCULAR | Status: DC

## 2011-12-21 MED ORDER — METOPROLOL TARTRATE 50 MG PO TABS
50.0000 mg | ORAL_TABLET | Freq: Two times a day (BID) | ORAL | Status: DC
  Administered 2011-12-21 – 2011-12-23 (×4): 50 mg via ORAL
  Filled 2011-12-21 (×5): qty 1

## 2011-12-21 MED ORDER — PANTOPRAZOLE SODIUM 40 MG PO TBEC
40.0000 mg | DELAYED_RELEASE_TABLET | Freq: Every day | ORAL | Status: DC
  Administered 2011-12-22 – 2011-12-23 (×2): 40 mg via ORAL
  Filled 2011-12-21 (×2): qty 1

## 2011-12-21 MED ORDER — LEVOTHYROXINE SODIUM 200 MCG PO TABS
200.0000 ug | ORAL_TABLET | Freq: Every day | ORAL | Status: DC
  Administered 2011-12-22 – 2011-12-23 (×2): 200 ug via ORAL
  Filled 2011-12-21 (×3): qty 1

## 2011-12-21 MED ORDER — GLIPIZIDE 5 MG PO TABS
5.0000 mg | ORAL_TABLET | Freq: Every day | ORAL | Status: DC
  Administered 2011-12-23: 5 mg via ORAL
  Filled 2011-12-21 (×3): qty 1

## 2011-12-21 MED ORDER — ACETAMINOPHEN 325 MG PO TABS
650.0000 mg | ORAL_TABLET | ORAL | Status: DC | PRN
  Administered 2011-12-23: 650 mg via ORAL
  Filled 2011-12-21: qty 2

## 2011-12-21 MED ORDER — ZOLPIDEM TARTRATE 5 MG PO TABS: 5.0000 mg | ORAL_TABLET | Freq: Every evening | ORAL | Status: DC | PRN

## 2011-12-21 MED ORDER — FUROSEMIDE 10 MG/ML IJ SOLN
40.0000 mg | Freq: Two times a day (BID) | INTRAMUSCULAR | Status: DC
  Administered 2011-12-23: 40 mg via INTRAVENOUS
  Filled 2011-12-21 (×4): qty 4

## 2011-12-21 MED ORDER — OMEGA-3 FATTY ACIDS 1000 MG PO CAPS: 1.0000 g | ORAL_CAPSULE | Freq: Two times a day (BID) | ORAL | Status: DC

## 2011-12-21 MED ORDER — ONDANSETRON HCL 4 MG/2ML IJ SOLN: 4.0000 mg | Freq: Four times a day (QID) | INTRAMUSCULAR | Status: DC | PRN

## 2011-12-21 MED ORDER — FUROSEMIDE 40 MG PO TABS
40.0000 mg | ORAL_TABLET | Freq: Every day | ORAL | Status: DC
  Filled 2011-12-21: qty 1

## 2011-12-21 MED ORDER — OXYCODONE HCL 5 MG PO TABS
5.0000 mg | ORAL_TABLET | Freq: Four times a day (QID) | ORAL | Status: DC
  Administered 2011-12-21 – 2011-12-23 (×5): 5 mg via ORAL
  Filled 2011-12-21 (×5): qty 1

## 2011-12-21 MED ORDER — CITALOPRAM HYDROBROMIDE 10 MG PO TABS
5.0000 mg | ORAL_TABLET | Freq: Every day | ORAL | Status: DC
  Administered 2011-12-22 – 2011-12-23 (×2): 5 mg via ORAL
  Filled 2011-12-21 (×2): qty 1

## 2011-12-21 MED ORDER — WARFARIN SODIUM 7.5 MG PO TABS
7.5000 mg | ORAL_TABLET | Freq: Once | ORAL | Status: AC
  Administered 2011-12-21: 7.5 mg via ORAL
  Filled 2011-12-21: qty 1

## 2011-12-21 MED ORDER — ASPIRIN 81 MG PO CHEW
324.0000 mg | CHEWABLE_TABLET | Freq: Once | ORAL | Status: AC
  Administered 2011-12-21: 324 mg via ORAL

## 2011-12-21 MED ORDER — ATORVASTATIN CALCIUM 40 MG PO TABS
40.0000 mg | ORAL_TABLET | Freq: Every day | ORAL | Status: DC
  Filled 2011-12-21 (×3): qty 1

## 2011-12-21 NOTE — Progress Notes (Signed)
ANTICOAGULATION CONSULT NOTE - Initial Consult  Pharmacy Consult for Coumadin Indication: atrial fibrillation  Assessment: 66 YOM on chronic coumadin for Afib admitted for chest pain which is a chronic problem for him. No ECG change, cardiac enzymes negative x 2, likely for inpatient myoview tomorrow. Pharmacy is consulted to resume coumadin. Home dose: 5mg  daily per [facility], INR 1.19 on admission. hgb 11.2, plt. 171   Goal of Therapy:  INR 2-3 Monitor platelets by anticoagulation protocol: Yes   Plan:  - Coumadin 7.5mg  po x 1 - f/u daily INR  Bayard Hugger, PharmD, BCPS  Clinical Pharmacist  Pager: [phone]  12/21/2011,7:21 PM   Allergies  Allergen Reactions  . Morphine And Related Other (See Comments)    Per [facility]    Patient Measurements: Weight: 233 lb 11 oz (106 kg)  Vital Signs: Temp: 97.7 F (36.5 C) (09/03 1842) Temp src: Oral (09/03 1145) BP: 155/74 mmHg (09/03 1842) Pulse Rate: 50  (09/03 1842)  Labs:  Basename 12/21/11 1623 12/21/11 1622 12/21/11 1201  HGB -- -- 11.2*  HCT -- -- 32.7*  PLT -- -- 171  APTT -- -- --  LABPROT -- -- 15.4*  INR -- -- 1.19  HEPARINUNFRC -- -- --  CREATININE -- -- 0.70  CKTOTAL 63 -- --  CKMB 2.4 -- --  TROPONINI -- <0.30 <0.30    The CrCl is unknown because both a height and weight (above a minimum accepted value) are required for this calculation.   Medical History: Past Medical History  Diagnosis Date  . PVD (peripheral vascular disease)     Severe lower extremitiy  . Type 2 diabetes mellitus   . Mixed hyperlipidemia   . Obesity   . Seizure disorder   . OSA (obstructive sleep apnea)   . History of colon cancer 2007  . CVA (cerebrovascular accident) 2004    Persistent residual expressive aphasia  . Coronary atherosclerosis of native coronary artery     Multivessel  . Chest discomfort     Chronic on narcotics - Dr. Sherril Croon  . Cervical spine disease   . Iron deficiency anemia     Managed by Dr. Myna Hidalgo  . PAF  (paroxysmal atrial fibrillation)   . Carotid artery disease   . GERD (gastroesophageal reflux disease)

## 2011-12-21 NOTE — ED Notes (Signed)
Pt brought in via EMS from [facility] with c/o right side CP with right arm numbness. 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 EMS gave BASA x4, SL nitro x1, 20g IV left FA, CBG 147 and pt on continuous O2 [place] at 2L.

## 2011-12-21 NOTE — ED Notes (Signed)
Report called to charge RN by [facility] ED RN

## 2011-12-21 NOTE — ED Notes (Signed)
Pt states right side CP started after eating breakfast while walking down hallway, he experienced SOB and lightheaded. Staff at the [facility] called EMS.

## 2011-12-21 NOTE — ED Notes (Signed)
Pt to XR

## 2011-12-21 NOTE — ED Notes (Signed)
Pt back from XR and on monitor 

## 2011-12-21 NOTE — H&P (Signed)
CARDIOLOGY HISTORY AND PHYSICAL   Patient ID: RODRIQUES BADIE MRN: [id number] DOB/AGE: 66-Nov-1947 66 y.o.  Admit date: 12/21/2011  Primary Physician   Ignatius Specking., MD Primary Cardiologist   Dr. Diona Browner, former long term pt of Dr. Deborah Chalk Reason for Consultation   Rt sided chest pain  NWG:NFAOZH Lacretia Nicks Ojeda is a 66 y.o. male with a history of CAD including MI x5 and CABG, chronic chest pain with narcotic therapy managed by Dr. Sherril Croon, PAF with hx of stroke and expressive aphasia, PVD with bypass, who presents to the ED by EMS for rt sided CP and Rt arm numbness. His chest pain started some time today, exact timing unclear. It is right-sided and radiates down his right arm. He seems to have chest pain constantly but it worsened today.  He also has baseline exertional dyspnea. Difficult to characterize but seems to be when he walks for more than a short distance. He has some difficult to characterize leg pain that may be from claudication.   Past Medical History  Diagnosis Date  . PVD (peripheral vascular disease)     Severe lower extremitiy  . Type 2 diabetes mellitus   . Mixed hyperlipidemia   . Obesity   . Seizure disorder   . OSA (obstructive sleep apnea)   . History of colon cancer 2007  . CVA (cerebrovascular accident) 2004    Persistent residual expressive aphasia  . Coronary atherosclerosis of native coronary artery     Multivessel  . Chest discomfort     Chronic on narcotics - Dr. Sherril Croon  . Cervical spine disease   . Iron deficiency anemia     Managed by Dr. Myna Hidalgo  . PAF (paroxysmal atrial fibrillation)   . Carotid artery disease   . GERD (gastroesophageal reflux disease)      Past Surgical History  Procedure Date  . Carotid endarterectomy   . Left fem-pop bypass   . Partial colectomy   . Coronary artery bypass graft 05/2006    LIMA to diagonal, RIMA to LAD, radial to OM, radial to PDA  . Left shoulder surgery   . Tonsillectomy   . Cholecystectomy   . Adenoidectomy     . Right fem-pop bypass     Allergies  Allergen Reactions  . Morphine And Related Other (See Comments)    Per MAR    I have reviewed the patient's current medications    . aspirin  324 mg Oral Once     nitroGLYCERIN  Medication Sig  ALPRAZolam (XANAX) 1 MG tablet Take 1 mg by mouth 3 (three) times daily. scheduled  amLODipine (NORVASC) 10 MG tablet Take 10 mg by mouth daily.    carbamazepine (CARBATROL) 200 MG 12 hr capsule Take 200 mg by mouth 5 (five) times daily. 2400; 0500; 1000; 1400; 1900  citalopram (CELEXA) 10 MG tablet Take 5 mg by mouth daily.   divalproex (DEPAKOTE) 500 MG EC tablet Take 1,000 mg by mouth 3 (three) times daily.    fish oil-omega-3 fatty acids 1000 MG capsule Take 1 g by mouth 2 (two) times daily.   furosemide (LASIX) 40 MG tablet Take 40 mg by mouth daily.    glipiZIDE (GLUCOTROL) 5 MG tablet Take 5 mg by mouth daily before breakfast.   isosorbide mononitrate (IMDUR) 120 MG 24 hr tablet Take 120 mg by mouth daily.   levothyroxine (SYNTHROID, LEVOTHROID) 200 MCG tablet Take 200 mcg by mouth daily.   levothyroxine (SYNTHROID, LEVOTHROID) 50 MCG tablet Take 50 mcg  by mouth once a week. Sundays only along with tablet to equal total dose of on Sundays  metoprolol (LOPRESSOR) 100 MG tablet Take 100 mg by mouth 2 (two) times daily.    mirtazapine (REMERON) 7.5 MG tablet Take 7.5 mg by mouth at bedtime.  omeprazole (PRILOSEC) 20 MG capsule Take 40 mg by mouth daily.  oxyCODONE (OXY IR/ROXICODONE) 5 MG immediate release tablet Take 5 mg by mouth every 6 (six) hours. Scheduled; 0600; 1200; 1800; 2400  rosuvastatin (CRESTOR) 20 MG tablet Take 20 mg by mouth daily.  warfarin (JANTOVEN) 5 MG tablet Take 5 mg by mouth daily.     History   Social History  . Marital Status: Married    Spouse Name: N/A    Number of Children: N/A  . Years of Education: N/A   Occupational History  . Disabled   Social History Main Topics  . Smoking status:  Former Smoker -- 1.0 packs/day for 25 years    Types: Cigarettes    Quit date: 10/14/1990  . Smokeless tobacco: Never Used  . Alcohol Use: No  . Drug Use: No  . Sexually Active: No   Other Topics Concern  . Not on file   Social History Narrative  . Lives in a facility     Family History  Problem Relation Age of Onset  . Diabetes Mother      ROS:  Full 14 point review of systems complete and found to be negative unless listed above.  Physical Exam: Blood pressure 153/61, pulse 50, temperature 97.9 F (36.6 C), temperature source Oral, resp. rate 18, SpO2 100.00%.  General: Well developed, well nourished, male in no acute distress, obese.  Head: Eyes PERRLA, No xanthomas.   Normocephalic and atraumatic, oropharynx without edema or exudate. Dentition: edentulous Lungs: bilateral basilar rales, distant breath sounds Heart: mildly bradycardic, HRRR S1 S2, no rub/gallop,no significant murmur. No radial pulses palpable.  Unable to palpate right DP or left PT pulses.    Neck: No carotid bruits. No lymphadenopathy.  JVD slightly elevated at 9 cm. Abdomen: Bowel sounds present, abdomen soft and non-tender without masses or hernias noted. Msk:  No spine or cva tenderness. No weakness, no joint deformities or effusions. Extremities: No clubbing or cyanosis. Trace edema.  Neuro: Alert and oriented X 1. Chronic expressive problems since his CVA.  No new focal deficits noted. Psych:  Responds appropriately, as he is able Skin: No rashes or lesions noted.  Labs:   Lab Results  Component Value Date   WBC 3.9* 12/21/2011   HGB 11.2* 12/21/2011   HCT 32.7* 12/21/2011   MCV 88.9 12/21/2011   PLT 171 12/21/2011    Basename 12/21/11 1201  INR 1.19    Lab 12/21/11 1201  NA 133*  K 4.5  CL 97  CO2 29  BUN 24*  CREATININE 0.70  CALCIUM 8.7  PROT --  BILITOT --  ALKPHOS --  ALT --  AST --  GLUCOSE 118*    Basename 12/21/11 1201  CKTOTAL --  CKMB --  TROPONINI <0.30   Stress Test:  04/24/2003 IMPRESSION  Inferior wall infarct.  No evidence for ischemia.  Estimated ejection fraction is 55%  CABG: 06/10/2006 He had a left IMA to  the diagonal, right IMA to the left anterior descending, radial  artery graft to the obtuse marginal branch of the left circumflex  coronary artery and radial artery graft to the posterior descending  branch of the right coronary artery on  June 10, 2006.  ECG:  21-Dec-2011 11:55:26  SINUS RHYTHM ~ normal P axis, V-rate 50- 99 BORDERLINE T ABNORMALITIES, LATERAL LEADS ~ T flat/neg, I aVL V5 V6 Vent. rate 50 BPM PR interval 196 ms QRS duration 98 ms QT/QTc 476/434 ms P-R-T axes 25 50 116  Radiology:  Dg Chest 2 View 12/21/2011  *RADIOLOGY REPORT*  Clinical Data: Short of breath  CHEST - 2 VIEW  Comparison: 07/26/2011  Findings: Heart is normal in size.  Lungs are markedly under aerated with bibasilar atelectasis.  Vascular congestion without signs of interstitial edema.  No pneumothorax.  IMPRESSION: Bibasilar atelectasis.  Vascular congestion.   Original Report Authenticated By: Donavan Burnet, M.D.     ASSESSMENT AND PLAN:   The patient was seen today by Dr Shirlee Latch, the patient evaluated and the data reviewed.   Active Problems: Principal Problem:  *Chest pain - acute on chronic. No new changes on ECG and initial enzymes negative. Admit overnight and rule out MI.   Sinus bradycardia - Will decrease BB to 50 mg BID.   Active Problems:  Coronary atherosclerosis of native coronary artery  Essential hypertension, benign   Signed: Theodore Demark 12/21/2011, 2:46 PM Co-Sign MD  Patient seen with PA, agree with note.  Difficult history due to a degree of expressive aphasia.   1. Chest pain: Has chronic chest pain and is on narcotics for this.  It seems like the chest pain became worse today but difficult to get definite history.  He lives at a SNF.  1st set of cardiac enzymes normal, ECG unchanged.  - Cycle cardiac enzymes.  - If  rules out, should likely get an inpatient myoview in am.  - Continue current cardiac meds except cut back on metoprolol to 50 mg bid given bradycardia.  2. CHF: Patient appears at least mildly volume overloaded on exam.  He is on po Lasix at home.  - Echo  - Lasix 40 mg IV bid. - Strict I/Os.  - Daily weights.  - BNP.  3. Atrial fibrillation: PAF, he is in NSR today.  He should be on coumadin but INR is 1.1 today.  Will continue coumadin for now.   Marca Ancona 12/21/2011 4:11 PM

## 2011-12-21 NOTE — ED Provider Notes (Signed)
History     CSN: [id number]  Arrival date & time 12/21/11  1144   First MD Initiated Contact with Patient 12/21/11 1155      Chief Complaint  Patient presents with  . Chest Pain    (Consider location/radiation/quality/duration/timing/severity/associated sxs/prior treatment) HPI Comments: history of CAD including MI x5 and CABG, chronic chest pain with narcotic therapy managed by Dr. Sherril Croon, PAF with hx of stroke and expressive aphasia, PVD with bypass, presenting with R sided chest pain that onset sometime today, timing unclear.  Associated with tingling and numbness in R arm.  Feels like prior MIs.  Seems to have chronic chest pain and pain issues.  No nausea, vomiting, abdominal pain, SOB, cough.  The history is provided by the patient and the EMS personnel. The history is limited by the condition of the patient.    Past Medical History  Diagnosis Date  . PVD (peripheral vascular disease)     Severe lower extremitiy  . Type 2 diabetes mellitus   . Mixed hyperlipidemia   . Obesity   . Seizure disorder   . OSA (obstructive sleep apnea)   . History of colon cancer 2007  . CVA (cerebrovascular accident) 2004    Persistent residual expressive aphasia  . Coronary atherosclerosis of native coronary artery     Multivessel  . Chest discomfort     Chronic on narcotics - Dr. Sherril Croon  . Cervical spine disease   . Iron deficiency anemia     Managed by Dr. Myna Hidalgo  . PAF (paroxysmal atrial fibrillation)   . Carotid artery disease   . GERD (gastroesophageal reflux disease)     Past Surgical History  Procedure Date  . Carotid endarterectomy   . Left fem-pop bypass   . Partial colectomy   . Coronary artery bypass graft 05/2006    LIMA to diagonal, RIMA to LAD, radial to OM, radial to PDA  . Left shoulder surgery   . Tonsillectomy   . Cholecystectomy   . Adenoidectomy   . Right fem-pop bypass     Family History  Problem Relation Age of Onset  . Diabetes Mother     History    Substance Use Topics  . Smoking status: Former Smoker -- 1.0 packs/day for 25 years    Types: Cigarettes    Quit date: 10/14/1990  . Smokeless tobacco: Never Used  . Alcohol Use: No      Review of Systems  Unable to perform ROS: Other    Allergies  Morphine and related  Home Medications   Current Outpatient Rx  Name Route Sig Dispense Refill  . ALPRAZOLAM 1 MG PO TABS Oral Take 1 mg by mouth 3 (three) times daily. scheduled    . AMLODIPINE BESYLATE 10 MG PO TABS Oral Take 10 mg by mouth daily.      Marland Kitchen CARBAMAZEPINE ER 200 MG PO CP12 Oral Take 200 mg by mouth 5 (five) times daily. 2400; 0500; 1000; 1400; 1900    . CITALOPRAM HYDROBROMIDE 10 MG PO TABS Oral Take 5 mg by mouth daily.     Marland Kitchen DIVALPROEX SODIUM 500 MG PO TBEC Oral Take 1,000 mg by mouth 3 (three) times daily.      . OMEGA-3 FATTY ACIDS 1000 MG PO CAPS Oral Take 1 g by mouth 2 (two) times daily.     . FUROSEMIDE 40 MG PO TABS Oral Take 40 mg by mouth daily.      Marland Kitchen GLIPIZIDE 5 MG PO TABS Oral Take  5 mg by mouth daily before breakfast.     . ISOSORBIDE MONONITRATE ER 120 MG PO TB24 Oral Take 120 mg by mouth daily.     Marland Kitchen LEVOTHYROXINE SODIUM 200 MCG PO TABS Oral Take 200 mcg by mouth daily.     Marland Kitchen LEVOTHYROXINE SODIUM 50 MCG PO TABS Oral Take 50 mcg by mouth once a week. Sundays only along with tablet to equal total dose of on Sundays    . METOPROLOL TARTRATE 100 MG PO TABS Oral Take 100 mg by mouth 2 (two) times daily.      Marland Kitchen MIRTAZAPINE 7.5 MG PO TABS Oral Take 7.5 mg by mouth at bedtime.    . OMEPRAZOLE 20 MG PO CPDR Oral Take 40 mg by mouth daily.    . OXYCODONE HCL 5 MG PO TABS Oral Take 5 mg by mouth every 6 (six) hours. Scheduled; 0600; 1200; 1800; 2400    . ROSUVASTATIN CALCIUM 20 MG PO TABS Oral Take 20 mg by mouth daily.    . WARFARIN SODIUM 5 MG PO TABS Oral Take 5 mg by mouth daily.      BP 153/61  Pulse 50  Temp 97.9 F (36.6 C) (Oral)  Resp 18  SpO2 100%  Physical Exam  Constitutional:  He is oriented to person, place, and time. He appears well-developed and well-nourished. No distress.  HENT:  Head: Normocephalic and atraumatic.  Mouth/Throat: Oropharynx is clear and moist. No oropharyngeal exudate.  Eyes: Conjunctivae are normal. Pupils are equal, round, and reactive to light.  Neck: Normal range of motion. JVD present.  Cardiovascular: Normal rate, regular rhythm and normal heart sounds.   No murmur heard. Pulmonary/Chest: Effort normal and breath sounds normal.       Faint basilar crackes  Abdominal: Soft. There is no tenderness. There is no rebound and no guarding.  Musculoskeletal: Normal range of motion. He exhibits no edema and no tenderness.       +1 L PT, +1 R DP  Neurological: He is oriented to person, place, and time. No cranial nerve deficit.  Skin: Skin is warm.    ED Course  Procedures (including critical care time)  Labs Reviewed  CBC WITH DIFFERENTIAL - Abnormal; Notable for the following:    WBC 3.9 (*)     RBC 3.68 (*)     Hemoglobin 11.2 (*)     HCT 32.7 (*)     Monocytes Relative 13 (*)     All other components within normal limits  BASIC METABOLIC PANEL - Abnormal; Notable for the following:    Sodium 133 (*)     Glucose, Bld 118 (*)     BUN 24 (*)     All other components within normal limits  PROTIME-INR - Abnormal; Notable for the following:    Prothrombin Time 15.4 (*)     All other components within normal limits  URINALYSIS, ROUTINE W REFLEX MICROSCOPIC  TROPONIN I   Dg Chest 2 View  12/21/2011  *RADIOLOGY REPORT*  Clinical Data: Short of breath  CHEST - 2 VIEW  Comparison: 07/26/2011  Findings: Heart is normal in size.  Lungs are markedly under aerated with bibasilar atelectasis.  Vascular congestion without signs of interstitial edema.  No pneumothorax.  IMPRESSION: Bibasilar atelectasis.  Vascular congestion.   Original Report Authenticated By: Donavan Burnet, M.D.      No diagnosis found.    MDM  history of CAD  including MI x5 and CABG, chronic chest  pain with narcotic therapy managed by Dr. Sherril Croon, PAF with hx of stroke and expressive aphasia, PVD with bypass  EKG unchanged, troponin negative. Difficult historian, concerning that feels like prior MI. D/w Cardiology. Admission planned for inpatient stress test.      Date: 12/21/2011  Rate: 50  Rhythm: normal sinus rhythm  QRS Axis: normal  Intervals: normal  ST/T Wave abnormalities: nonspecific ST/T changes  Conduction Disutrbances:none  Narrative Interpretation:   Old EKG Reviewed: unchanged    Glynn Octave, MD 12/21/11 1920

## 2011-12-22 ENCOUNTER — Encounter (HOSPITAL_COMMUNITY): Payer: Self-pay | Admitting: General Practice

## 2011-12-22 ENCOUNTER — Observation Stay (HOSPITAL_COMMUNITY): Payer: Medicare Other

## 2011-12-22 DIAGNOSIS — I517 Cardiomegaly: Secondary | ICD-10-CM

## 2011-12-22 DIAGNOSIS — R079 Chest pain, unspecified: Secondary | ICD-10-CM

## 2011-12-22 LAB — LIPID PANEL
LDL Cholesterol: 50 mg/dL (ref 0–99)
Triglycerides: 191 mg/dL — ABNORMAL HIGH (ref <150)
VLDL: 38 mg/dL (ref 0–40)

## 2011-12-22 LAB — CK TOTAL AND CKMB (NOT AT ARMC)
CK, MB: 2.2 ng/mL (ref 0.3–4.0)
Relative Index: INVALID (ref 0.0–2.5)
Total CK: 62 U/L (ref 7–232)

## 2011-12-22 LAB — GLUCOSE, CAPILLARY
Glucose-Capillary: 107 mg/dL — ABNORMAL HIGH (ref 70–99)
Glucose-Capillary: 113 mg/dL — ABNORMAL HIGH (ref 70–99)

## 2011-12-22 LAB — PROTIME-INR
INR: 1.17 (ref 0.00–1.49)
Prothrombin Time: 15.1 seconds (ref 11.6–15.2)

## 2011-12-22 LAB — COMPREHENSIVE METABOLIC PANEL
ALT: 11 U/L (ref 0–53)
AST: 14 U/L (ref 0–37)
Albumin: 3.3 g/dL — ABNORMAL LOW (ref 3.5–5.2)
Alkaline Phosphatase: 65 U/L (ref 39–117)
Calcium: 9.1 mg/dL (ref 8.4–10.5)
GFR calc Af Amer: >90 mL/min (ref >90)
Glucose, Bld: 111 mg/dL — ABNORMAL HIGH (ref 70–99)
Potassium: 4.9 mEq/L (ref 3.5–5.1)
Sodium: 136 mEq/L (ref 135–145)
Total Protein: 8.1 g/dL (ref 6.0–8.3)

## 2011-12-22 MED ORDER — TECHNETIUM TC 99M TETROFOSMIN IV KIT
10.0000 | PACK | Freq: Once | INTRAVENOUS | Status: AC | PRN
  Administered 2011-12-22: 10 via INTRAVENOUS

## 2011-12-22 MED ORDER — TECHNETIUM TC 99M TETROFOSMIN IV KIT
30.0000 | PACK | Freq: Once | INTRAVENOUS | Status: AC | PRN
  Administered 2011-12-22: 30 via INTRAVENOUS

## 2011-12-22 MED ORDER — WARFARIN SODIUM 7.5 MG PO TABS
7.5000 mg | ORAL_TABLET | Freq: Once | ORAL | Status: DC
  Filled 2011-12-22: qty 1

## 2011-12-22 MED ORDER — REGADENOSON 0.4 MG/5ML IV SOLN
INTRAVENOUS | Status: AC
  Administered 2011-12-22: 0.4 mg via INTRAVENOUS
  Filled 2011-12-22: qty 5

## 2011-12-22 MED ORDER — CARBAMAZEPINE ER 200 MG PO TB12
200.0000 mg | ORAL_TABLET | ORAL | Status: DC
  Administered 2011-12-22 – 2011-12-23 (×5): 200 mg via ORAL
  Filled 2011-12-22 (×10): qty 1

## 2011-12-22 MED ORDER — REGADENOSON 0.4 MG/5ML IV SOLN
0.4000 mg | Freq: Once | INTRAVENOUS | Status: AC
  Administered 2011-12-22: 0.4 mg via INTRAVENOUS
  Filled 2011-12-22: qty 5

## 2011-12-22 NOTE — Progress Notes (Signed)
Clarified with Dr. Eden Emms that patient is to remain in hospital tonight. Nuc showed inferior scar, no reversible ischemia, EF 48%. 2D echo showed EF 60-65%, mild LVH, PA pressure , mild-mod calcified aortic annulus with moderately thickened leaflets, L atrium moderately dilated. Results briefly discussed with patient. Continue Lasix as per earlier note. Rohnan Bartleson PA-C

## 2011-12-22 NOTE — Progress Notes (Signed)
Utilization review complete 

## 2011-12-22 NOTE — Progress Notes (Signed)
ANTICOAGULATION CONSULT NOTE - Follow Up Consult  Pharmacy Consult for Coumadin Indication: atrial fibrillation  Allergies  Allergen Reactions  . Morphine And Related Other (See Comments)    Per [facility]    Patient Measurements: Weight: 230 lb 9.6 oz (104.6 kg)  Vital Signs: Temp: 97.4 F (36.3 C) (09/04 0613) Temp src: Oral (09/04 0613) BP: 139/63 mmHg (09/04 0613) Pulse Rate: 52  (09/04 0613)  Labs:  Basename 12/22/11 0500 12/21/11 2009 12/21/11 1623 12/21/11 1622 12/21/11 1201  HGB -- 11.8* -- -- 11.2*  HCT -- 34.6* -- -- 32.7*  PLT -- 185 -- -- 171  APTT -- -- -- -- --  LABPROT 15.1 -- -- -- 15.4*  INR 1.17 -- -- -- 1.19  HEPARINUNFRC -- -- -- -- --  CREATININE 0.67 0.66 -- -- 0.70  CKTOTAL 62 -- 63 -- --  CKMB 2.2 -- 2.4 -- --  TROPONINI -- -- -- <0.30 <0.30   Assessment:   Chronic Warfarin for atrial fibrillation. Outpatient regimen is Jantoven 5 mg daily, but INRs are subtherapeutic.  On Lovenox 40 mg SQ q24hrs;   Coumadin 7.5 mg given 9/3 pm.    Among home meds is Carbatrol (extended-release Carbamazepine) 200 mg five times a day.  ER formulation is usually dosed twice daily. Confirmed regimen with Dr. Sherril Croon in [place].  Discussed briefly with Dr. Eden Emms.  Will add Tegretol level for am.  [facility] substitution for Carbatrol is Tegretol XR.  Goal of Therapy:  INR 2-3 Monitor platelets by anticoagulation protocol: Yes   Plan:   Repeat Coumadin 7.5 mg today.  Continue Lovenox 40 mg SQ q24hrs.  Continue daily PT/INR.  Tegretol level in am.  Dennie Fetters, RPh Pager: [phone] 12/22/2011,10:40 AM

## 2011-12-22 NOTE — Progress Notes (Addendum)
CSW tried for a second time to assess pt.  Pt was still in procedure.  CSW spoke to pt brother who was waiting at bedside for pt to return.  Pt brother's name is [name].  CSW will continue to make attempts to assess pt and d/c needs.  CSW called [facility], SNF in [place], [place] who confirmed that pt was a resident there prior to d/c.  SNF stated that pt would continue to have a bed there upon d/c.  CSW will f/u with SNF to ensure continuity of care for pt. Vickii Penna, LCSWA [phone]  Clinical Social Work

## 2011-12-22 NOTE — Progress Notes (Signed)
  Echocardiogram 2D Echocardiogram has been performed.  [name] 12/22/2011, 3:31 PM

## 2011-12-22 NOTE — Care Management Note (Unsigned)
    Page 1 of 1   12/22/2011     11:50:21 AM   CARE MANAGEMENT NOTE 12/22/2011  Patient:  [name], [name]   Account Number:  [id number]  Date Initiated:  12/22/2011  Documentation initiated by:  SIMMONS,Emilliano Dilworth  Subjective/Objective Assessment:   ADMITTED WITH CP; LIVES AT [facility] IN [place]. CSW FOLLOWING.     Action/Plan:   DISCHARGE PLANNING INITIATED.   Anticipated DC Date:  12/23/2011   Anticipated DC Plan:  SKILLED NURSING FACILITY  In-house referral  Clinical Social Worker      DC Planning Services  CM consult      Choice offered to / List presented to:             Status of service:  In process, will continue to follow Medicare Important Message given?   (If response is "NO", the following Medicare IM given date fields will be blank) Date Medicare IM given:   Date Additional Medicare IM given:    Discharge Disposition:    Per UR Regulation:  Reviewed for med. necessity/level of care/duration of stay  If discussed at Long Length of Stay Meetings, dates discussed:    Comments:  12/22/11  1150  Bertil Brickey SIMMONS RN, BSN [phone] NCM WILL FOLLOW.

## 2011-12-22 NOTE — Progress Notes (Signed)
Patient ID: [name], male   DOB: 1945/06/14, 66 y.o.   MRN: [id number]    Subjective:  Chest pain at rest atypical  Objective:  Filed Vitals:   12/21/11 1145 12/21/11 1842 12/21/11 2031 12/22/11 0613  BP: 153/61 155/74 169/63 139/63  Pulse: 50 50 58 52  Temp: 97.9 F (36.6 C) 97.7 F (36.5 C) 98.8 F (37.1 C) 97.4 F (36.3 C)  TempSrc: Oral  Oral Oral  Resp: 18 18 18 18   Weight:  233 lb 11 oz (106 kg)  230 lb 9.6 oz (104.6 kg)  SpO2: 100% 100% 99% 99%    Intake/Output from previous day:  Intake/Output Summary (Last 24 hours) at 12/22/11 0741 Last data filed at 12/22/11 0615  Gross per 24 hour  Intake      0 ml  Output    701 ml  Net   -701 ml    Physical Exam: Affect appropriate Healthy:  appears stated age HEENT: normal Neck supple with no adenopathy JVP normal no bruits no thyromegaly Lungs clear with no wheezing and good diaphragmatic motion Heart:  S1/S2 no murmur, no rub, gallop or click PMI normal Abdomen: benighn, BS positve, no tenderness, no AAA no bruit.  No HSM or HJR Distal pulses diminished with PVD No edema Neuro aphasia poor hearing Skin warm and dry No muscular weakness   Lab Results: Basic Metabolic Panel:  Basename 12/22/11 0500 12/21/11 2009 12/21/11 1201  NA 136 -- 133*  K 4.9 -- 4.5  CL 97 -- 97  CO2 31 -- 29  GLUCOSE 111* -- 118*  BUN 22 -- 24*  CREATININE 0.67 0.66 --  CALCIUM 9.1 -- 8.7  MG -- -- --  PHOS -- -- --   Liver Function Tests:  Basename 12/22/11 0500  AST 14  ALT 11  ALKPHOS 65  BILITOT 0.2*  PROT 8.1  ALBUMIN 3.3*   No results found for this basename: LIPASE:2,AMYLASE:2 in the last 72 hours CBC:  Basename 12/21/11 2009 12/21/11 1201  WBC 5.0 3.9*  NEUTROABS -- 1.8  HGB 11.8* 11.2*  HCT 34.6* 32.7*  MCV 89.4 88.9  PLT 185 171   Cardiac Enzymes:  Basename 12/22/11 0500 12/21/11 1623 12/21/11 1622 12/21/11 1201  CKTOTAL 62 63 -- --  CKMB 2.2 2.4 -- --  CKMBINDEX -- -- -- --  TROPONINI --  -- <0.30 <0.30   BNP: No components found with this basename: POCBNP:3 D-Dimer: No results found for this basename: DDIMER:2 in the last 72 hours Hemoglobin A1C:  Basename 12/21/11 2009  HGBA1C 7.3*   Fasting Lipid Panel:  Basename 12/22/11 0500  CHOL 119  HDL 31*  LDLCALC 50  TRIG 409*  CHOLHDL 3.8  LDLDIRECT --   Thyroid Function Tests: No results found for this basename: TSH,T4TOTAL,FREET3,T3FREE,THYROIDAB in the last 72 hours Anemia Panel: No results found for this basename: VITAMINB12,FOLATE,FERRITIN,TIBC,IRON,RETICCTPCT in the last 72 hours  Imaging: Dg Chest 2 View  12/21/2011  *RADIOLOGY REPORT*  Clinical Data: Short of breath  CHEST - 2 VIEW  Comparison: 07/26/2011  Findings: Heart is normal in size.  Lungs are markedly under aerated with bibasilar atelectasis.  Vascular congestion without signs of interstitial edema.  No pneumothorax.  IMPRESSION: Bibasilar atelectasis.  Vascular congestion.   Original Report Authenticated By: Donavan Burnet, M.D.     Cardiac Studies:  ECG: NSR lateral T wave changes ? Old IMI   Telemetry: NSR no arrhythmia 12/22/2011   Echo:   Medications:     .  ALPRAZolam  1 mg Oral TID  . amLODipine  10 mg Oral Daily  . aspirin  324 mg Oral Once  . atorvastatin  40 mg Oral q1800  . carbamazepine  200 mg Oral 5 X Daily  . carbamazepine  200 mg Oral BID  . citalopram  5 mg Oral Daily  . divalproex  1,000 mg Oral TID  . enoxaparin (LOVENOX) injection  40 mg Subcutaneous Q24H  . furosemide  40 mg Intravenous BID  . furosemide  40 mg Oral Daily  . glipiZIDE  5 mg Oral QAC breakfast  . isosorbide mononitrate  120 mg Oral Daily  . levothyroxine  200 mcg Oral QAC breakfast  . levothyroxine  50 mcg Oral Weekly  . metoprolol  50 mg Oral BID  . mirtazapine  7.5 mg Oral QHS  . omega-3 acid ethyl esters  1 g Oral BID  . oxyCODONE  5 mg Oral Q6H  . pantoprazole  40 mg Oral Q1200  . potassium chloride  20 mEq Oral Daily  . sodium chloride  3  mL Intravenous Q12H  . warfarin  7.5 mg Oral Once  . Warfarin - Pharmacist Dosing Inpatient   Does not apply q1800  . DISCONTD: fish oil-omega-3 fatty acids  1 g Oral BID  . DISCONTD: furosemide  40 mg Intravenous BID  . DISCONTD: levothyroxine  200 mcg Oral Daily       Assessment/Plan:  Chest Pain : Atypical R/O chronic and resting  Lexiscan myovue today if possible PAF:  Continue coumadin INR subRx on admission Volume:  Overload echo ordered continue lasix HTN:  Continue amlodipine and beta blocker DM:  Check A1c and continue oral hypoglycemic    Charlton Haws 12/22/2011, 7:41 AM

## 2011-12-22 NOTE — Progress Notes (Signed)
CSW received a referral for this pt in progression this am.  Pt is presumed to be from SNF- [facility] in [place].  This CSW will confirm this.  CSW went to assess pt.  Pt was in procedure- stress test.  CSW will return to assess pt after lunch. Vickii Penna, LCSWA [phone]  Clinical Social Work

## 2011-12-23 LAB — BASIC METABOLIC PANEL
BUN: 23 mg/dL (ref 6–23)
Chloride: 98 mEq/L (ref 96–112)
GFR calc Af Amer: >90 mL/min (ref >90)
GFR calc non Af Amer: >90 mL/min (ref >90)
Potassium: 4.8 mEq/L (ref 3.5–5.1)
Sodium: 134 mEq/L — ABNORMAL LOW (ref 135–145)

## 2011-12-23 LAB — GLUCOSE, CAPILLARY
Glucose-Capillary: 113 mg/dL — ABNORMAL HIGH (ref 70–99)
Glucose-Capillary: 72 mg/dL (ref 70–99)

## 2011-12-23 LAB — CARBAMAZEPINE LEVEL, TOTAL: Carbamazepine Lvl: 5.3 ug/mL (ref 4.0–12.0)

## 2011-12-23 LAB — PROTIME-INR: INR: 1.63 — ABNORMAL HIGH (ref 0.00–1.49)

## 2011-12-23 MED ORDER — METOPROLOL TARTRATE 50 MG PO TABS: 50.0000 mg | ORAL_TABLET | Freq: Two times a day (BID) | ORAL | Status: DC

## 2011-12-23 MED ORDER — FUROSEMIDE 20 MG PO TABS: 10.0000 mg | ORAL_TABLET | Freq: Every day | ORAL | Status: DC

## 2011-12-23 MED ORDER — NITROGLYCERIN 0.4 MG SL SUBL: 0.4000 mg | SUBLINGUAL_TABLET | SUBLINGUAL | Status: DC | PRN

## 2011-12-23 NOTE — Clinical Social Work Placement (Signed)
Clinical Social Work Department CLINICAL SOCIAL WORK PLACEMENT NOTE 12/23/2011  Patient:  BARCLAY, LENNOX  Account Number:  [id number] Admit date:  12/21/2011  Clinical Social Worker:  Read Drivers  Date/time:  12/23/2011 11:25 AM  Clinical Social Work is seeking post-discharge placement for this patient at the following level of care:   SKILLED NURSING   (*CSW will update this form in Epic as items are completed)     Patient/family provided with [facility] Department of Clinical Social Work's list of facilities offering this level of care within the geographic area requested by the patient (or if unable, by the patient's family).    Patient/family informed of their freedom to choose among providers that offer the needed level of care, that participate in Medicare, Medicaid or managed care program needed by the patient, have an available bed and are willing to accept the patient.    Patient/family informed of [facility]' ownership interest in [facility], as well as of the fact that they are under no obligation to receive care at this facility.  PASARR submitted to EDS on  PASARR number received from EDS on   FL2 transmitted to all facilities in geographic area requested by pt/family on   FL2 transmitted to all facilities within larger geographic area on   Patient informed that his/her managed care company has contracts with or will negotiate with  certain facilities, including the following:     Patient/family informed of bed offers received:   Patient chooses bed at  Physician recommends and patient chooses bed at    Patient to be transferred to  on   Patient to be transferred to facility by   The following physician request were entered in Epic:   Additional Comments: Pt will return to SNF- [facility] upon d/c.  Vickii Penna, LCSWA [phone]  Clinical Social Work

## 2011-12-23 NOTE — Progress Notes (Signed)
CSW text page MD.  Clovis Cao is in chart ready for signature.  Once signed and d/c summary complete, pt and SNF will be ready for d/c. Vickii Penna, LCSWA [phone]  Clinical Social Work

## 2011-12-23 NOTE — Progress Notes (Signed)
D/c instructions provided. IVs d/c'd. Med list and care packet provided. Patient transported via wheelchair to main lobby for d/c to [facility] per patient's brother. [name]

## 2011-12-23 NOTE — Discharge Summary (Signed)
Discharge Summary   Patient ID: [name] MRN: [id number], DOB/AGE: 1946/02/22 66 y.o.  Primary MD: Ignatius Specking., MD Primary Cardiologist: Dr. Diona Browner in [place] Admit date: 12/21/2011 D/C date:     12/23/2011      Primary Discharge Diagnoses:  1. Acute on Chronic Chest pain, Noncardiac  - H/o chronic chest pain with narcotic therapy managed by Dr. Sherril Croon  - No objective evidence of cardiac ischemia  - Myoview showed inferior scar, no reversible ischemia, EF 48%  - F/u w/ Dr. Sherril Croon  2. Volume Overload  - Mild; Improved with diuresis  - EF 60-65% by echo  - Lasix decreased to 10mg  daily at discharge  - Discharge weight 228lbs  3. Anticoagulation  - On coumadin for PAF  - INR subtherapeutic on admission  - Cont coumadin with INR checks as outpt  Secondary Discharge Diagnoses:  1. Coronary Artery Disease s/p CABG 2008 LIMA to diagonal, RIMA to LAD, radial to OM, radial to PDA  2. Paroxysmal Atrial Fibrillation 3. Carotid artery disease s/p CEA 4. 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 Severe PVD s/p bilat fem-pop bypass  6. Type 2 diabetes mellitus  7. Mixed hyperlipidemia 8. H/o tobacco abuse - quit 1992 9. GERD  10. Seizure disorder  11. Obesity  12. OSA   13. History of colon cancer 2007 s/p partial colectomy  14. Cervical spine disease   15. Iron deficiency anemia  16. Left shoulder surgery  17. Tonsillectomy   18. Cholecystectomy   19. Adenoidectomy    Allergies Allergies  Allergen Reactions  . Morphine And Related Other (See Comments)    Per MAR    Diagnostic Studies/Procedures:   12/22/2011 - Nm Myocar Multi W/spect W/wall Motion / Ef Findings: There is a fixed perfusion defect in the inferior wall extending to the apex with decreased perfusion without reversibility through the remainder of the inferior wall.  No reversible defect is identified.  Nonspecific hypokinetic septal motion is identified.  Wall motion is otherwise unremarkable.  End-  diastolic volume is 154 ml and end-systolic volume is 81 ml per ejection fraction of 48%.  IMPRESSION:  1.  Findings consistent with scar in the inferior wall to the apex. No reversible perfusion defect is identified. 2.  Nonspecific septal dyskinesia. 3.  Ejection fraction of 48%.     12/22/11 - Echo Study Conclusions: - Left ventricle: Wall thickness was increased in a pattern of mild LVH. Systolic function was normal. The estimated ejection fraction was in the range of 60% to 65%. - Aortic valve: Mildly to moderately calcified annulus. Moderately thickened leaflets. - Left atrium: The atrium was moderately dilated. - Pulmonary arteries: PA peak pressure: 38mm Hg (S).   History of Present Illness: 66 y.o. male w/ the above medical problems who presented to [facility] on 12/21/11 with complaints of right sided chest pain. He has chronic chest pain that worsened on day of presentation and was right sided with radiation to right arm prompting him to present to the ED.  Hospital Course: EKG revealed NSR with no acute ST/T changes. CXR showed bibasilar atelectasis and vascular congestion. Labs were significant for normal troponin, INR 1.1. He was admitted for further evaluation and treatment.   Beta blocker was decreased due to bradycardia. He was diuresed with IV lasix for mild volume overload with improvement in volume status. Cardiac enzymes were cycled and remained negative. Echo revealed EF 60-65%, mild LVH, PASP , mild-mod calcified aortic annulus with moderately thickened  leaflets, and mild LAE. Myoview showed inferior scar, no reversible ischemia, EF 48%. His chest pain was felt to be noncardiac in etiology. Lasix was decreased to 10mg  daily. He will continue coumadin with INR checks as outpatient. He was seen and evaluated by Dr. Eden Emms who felt he was stable for discharge home with plans for follow up as scheduled below.  Discharge Vitals: Blood pressure 124/47, pulse 67,  temperature 98.8 F (37.1 C), temperature source Oral, resp. rate 18, height 5\' 9"  (1.753 m), weight 228 lb (103.42 kg), SpO2 92.00%.  Labs: Lab Results  Component Value Date   WBC 5.0 12/21/2011   HGB 11.8* 12/21/2011   HCT 34.6* 12/21/2011   MCV 89.4 12/21/2011   PLT 185 12/21/2011    Lab 12/23/11 0645 12/22/11 0500  NA 134* --  K 4.8 --  CL 98 --  CO2 27 --  BUN 23 --  CREATININE 0.70 --  CALCIUM 9.0 --  PROT -- 8.1  BILITOT -- 0.2*  ALKPHOS -- 65  ALT -- 11  AST -- 14  GLUCOSE 114* --   Basename 12/22/11 0500 12/21/11 1623 12/21/11 1622 12/21/11 1201  CKTOTAL 62 63 -- --  CKMB 2.2 2.4 -- --  TROPONINI -- -- <0.30 <0.30   Component Value Date   CHOL 119 12/22/2011   HDL 31* 12/22/2011   LDLCALC 50 12/22/2011   TRIG 191* 12/22/2011     12/21/2011 16:23  Pro B Natriuretic peptide (BNP) 504.3 (H)     12/21/2011 12:01 12/22/2011 05:00 12/23/2011 06:45  Prothrombin Time 15.4 (H) 15.1 19.6 (H)  INR 1.19 1.17 1.63 (H)     12/23/2011 06:45  Carbamazepine Lvl 5.3     12/21/2011 20:09  Hemoglobin A1C 7.3 (H)    Discharge Medications   Medication List  As of 12/23/2011 10:57 AM   TAKE these medications         ALPRAZolam 1 MG tablet   Commonly known as: XANAX   Take 1 mg by mouth 3 (three) times daily. scheduled      amLODipine 10 MG tablet   Commonly known as: NORVASC   Take 10 mg by mouth daily.      carbamazepine 200 MG 12 hr capsule   Commonly known as: CARBATROL   Take 200 mg by mouth 5 (five) times daily. 2400; 0500; 1000; 1400; 1900      citalopram 10 MG tablet   Commonly known as: CELEXA   Take 5 mg by mouth daily.      divalproex 500 MG DR tablet   Commonly known as: DEPAKOTE   Take 1,000 mg by mouth 3 (three) times daily.      fish oil-omega-3 fatty acids 1000 MG capsule   Take 1 g by mouth 2 (two) times daily.      furosemide 20 MG tablet   Commonly known as: LASIX   Take 0.5 tablets (10 mg total) by mouth daily.      glipiZIDE 5 MG tablet   Commonly known as:  GLUCOTROL   Take 5 mg by mouth daily before breakfast.      isosorbide mononitrate 120 MG 24 hr tablet   Commonly known as: IMDUR   Take 120 mg by mouth daily.      JANTOVEN 5 MG tablet   Generic drug: warfarin   Take 5 mg by mouth daily.      levothyroxine 200 MCG tablet   Commonly known as: SYNTHROID, LEVOTHROID   Take 200 mcg  by mouth daily.      levothyroxine 50 MCG tablet   Commonly known as: SYNTHROID, LEVOTHROID   Take 50 mcg by mouth once a week. Sundays only along with tablet to equal total dose of on Sundays      metoprolol 50 MG tablet   Commonly known as: LOPRESSOR   Take 1 tablet (50 mg total) by mouth 2 (two) times daily.      mirtazapine 7.5 MG tablet   Commonly known as: REMERON   Take 7.5 mg by mouth at bedtime.      nitroGLYCERIN 0.4 MG SL tablet   Commonly known as: NITROSTAT   Place 1 tablet (0.4 mg total) under the tongue every 5 (five) minutes as needed for chest pain (up to 3 doses).      omeprazole 20 MG capsule   Commonly known as: PRILOSEC   Take 40 mg by mouth daily.      oxyCODONE 5 MG immediate release tablet   Commonly known as: Oxy IR/ROXICODONE   Take 5 mg by mouth every 6 (six) hours. Scheduled; 0600; 1200; 1800; 2400      rosuvastatin 20 MG tablet   Commonly known as: CRESTOR   Take 20 mg by mouth daily.            Disposition   Discharge Orders    Future Appointments: Provider: Department: Dept Phone: Center:   01/19/2012 8:40 AM Jonelle Sidle, MD [facility] [phone] [facility]     Future Orders Please Complete By Expires   Diet - low sodium heart healthy      Increase activity slowly      Discharge instructions      Comments:   * There were no findings to suggest your chest pain is coming from your heart. Please follow up with your primary care provider.  * Your INR (coumadin level) was low. Please continue to take your coumadin as prescribed and have your INR checked on Monday the 9th.       Follow-up Information    Follow up with Nona Dell, MD on 01/19/2012. (8:40)    Contact information:   [facility] [address] [place] [address] [phone]       Follow up with Ignatius Specking., MD.   [facility]ow information:   [address] [place] [address] [phone]           Outstanding Labs/Studies:  None  Duration of Discharge Encounter: Greater than 30 minutes including physician and PA time.  Signed, Reisha Wos PA-C 12/23/2011, 10:57 AM

## 2011-12-23 NOTE — Clinical Social Work Psychosocial (Signed)
Clinical Social Work Department BRIEF PSYCHOSOCIAL ASSESSMENT 12/23/2011  Patient:  [name], [name]     Account Number:  [id number]     Admit date:  12/21/2011  Clinical Social Worker:  Read Drivers  Date/Time:  12/23/2011 11:16 AM  Referred by:  RN  Date Referred:  12/23/2011 Referred for  SNF Placement   Other Referral:   none   Interview type:  Other - See comment Other interview type:   pt and brother, [name]    PSYCHOSOCIAL DATA Living Status:  FACILITY Admitted from facility:  [facility] Level of care:  Skilled Nursing Facility Primary support name:  [name] Primary support relationship to patient:  SIBLING Degree of support available:   adequate- Brother [name] major support    CURRENT CONCERNS Current Concerns  Post-Acute Placement   Other Concerns:   none    SOCIAL WORK ASSESSMENT / PLAN CSW assessed pt at bedside along with brother, [name].  Pt was alert and oriented and just had returned from a stress test.  Brother is main support.  Pt is from [facility], [place] and will return there upon d/c from [facility].  Brother does not want ambulance ride to SNF.  Brother will transport him to SNF.  CSW will help facilitate d/c return to SNF upon d/c.   Assessment/plan status:  Psychosocial Support/Ongoing Assessment of Needs Other assessment/ plan:   none   Information/referral to community resources:   SNF    PATIENT'S/FAMILY'S RESPONSE TO PLAN OF CARE: Pt and pt brother was very appreciative of CSW support.        Vickii Penna, LCSWA [phone]  Clinical Social Work

## 2011-12-23 NOTE — Progress Notes (Signed)
Patient ID: [name], male   DOB: [birth year], 66 y.o.   MRN: [id number]    Subjective:  No chest pain  Objective:  Filed Vitals:   12/22/11 1639 12/22/11 1900 12/22/11 2016 12/23/11 0525  BP: 157/67  156/70 134/66  Pulse: 55  58 61  Temp:   98.7 F (37.1 C) 98.8 F (37.1 C)  TempSrc:   Oral Oral  Resp:   18 18  Height:  5\' 9"  (1.753 m)    Weight:  234 lb 9.1 oz (106.4 kg)  228 lb (103.42 kg)  SpO2:   97% 92%    Intake/Output from previous day:  Intake/Output Summary (Last 24 hours) at 12/23/11 0750 Last data filed at 12/23/11 0600  Gross per 24 hour  Intake    720 ml  Output      0 ml  Net    720 ml    Physical Exam: Affect appropriate Healthy:  appears stated age HEENT: normal Neck supple with no adenopathy JVP normal no bruits no thyromegaly Lungs clear with no wheezing and good diaphragmatic motion Heart:  S1/S2 no murmur, no rub, gallop or click PMI normal Abdomen: benighn, BS positve, no tenderness, no AAA no bruit.  No HSM or HJR Distal pulses diminished with PVD No edema Neuro aphasia poor hearing Skin warm and dry No muscular weakness   Lab Results: Basic Metabolic Panel:  Basename 12/23/11 0645 12/22/11 0500  NA 134* 136  K 4.8 4.9  CL 98 97  CO2 27 31  GLUCOSE 114* 111*  BUN 23 22  CREATININE 0.70 0.67  CALCIUM 9.0 9.1  MG -- --  PHOS -- --   Liver Function Tests:  Basename 12/22/11 0500  AST 14  ALT 11  ALKPHOS 65  BILITOT 0.2*  PROT 8.1  ALBUMIN 3.3*   No results found for this basename: LIPASE:2,AMYLASE:2 in the last 72 hours CBC:  Basename 12/21/11 2009 12/21/11 1201  WBC 5.0 3.9*  NEUTROABS -- 1.8  HGB 11.8* 11.2*  HCT 34.6* 32.7*  MCV 89.4 88.9  PLT 185 171   Cardiac Enzymes:  Basename 12/22/11 0500 12/21/11 1623 12/21/11 1622 12/21/11 1201  CKTOTAL 62 63 -- --  CKMB 2.2 2.4 -- --  CKMBINDEX -- -- -- --  TROPONINI -- -- <0.30 <0.30   Hemoglobin A1C:  Basename 12/21/11 2009  HGBA1C 7.3*   Fasting  Lipid Panel:  Basename 12/22/11 0500  CHOL 119  HDL 31*  LDLCALC 50  TRIG 409*  CHOLHDL 3.8  LDLDIRECT --   Thyroid Function Tests:  Imaging: Dg Chest 2 View  12/21/2011  *RADIOLOGY REPORT*  Clinical Data: Short of breath  CHEST - 2 VIEW  Comparison: 07/26/2011  Findings: Heart is normal in size.  Lungs are markedly under aerated with bibasilar atelectasis.  Vascular congestion without signs of interstitial edema.  No pneumothorax.  IMPRESSION: Bibasilar atelectasis.  Vascular congestion.   Original Report Authenticated By: Donavan Burnet, M.D.    Nm Myocar Multi W/spect W/wall Motion / Ef  12/22/2011  *RADIOLOGY REPORT*  Clinical Data:  Chest pain.  Elevated cholesterol.  MYOCARDIAL IMAGING WITH SPECT (REST AND PHARMACOLOGIC-STRESS) GATED LEFT VENTRICULAR WALL MOTION STUDY LEFT VENTRICULAR EJECTION FRACTION  Technique:  Standard myocardial SPECT imaging was performed after resting intravenous injection of 10 mCi Tc-33m tetrofosmin. Subsequently, intravenous infusion of regadenoson was performed under the supervision of the Cardiology staff.  At peak effect of the drug, 30 mCi Tc-7m tetrofosmin was injected intravenously and standard  myocardial SPECT  imaging was performed.  Quantitative gated imaging was also performed to evaluate left ventricular wall motion, and estimate left ventricular ejection fraction.  Comparison:  None.  Findings: There is a fixed perfusion defect in the inferior wall extending to the apex with decreased perfusion without reversibility through the remainder of the inferior wall.  No reversible defect is identified.  Nonspecific hypokinetic septal motion is identified.  Wall motion is otherwise unremarkable.  End- diastolic volume is 154 ml and end-systolic volume is 81 ml per ejection fraction of 48%.  IMPRESSION:  1.  Findings consistent with scar in the inferior wall to the apex. No reversible perfusion defect is identified. 2.  Nonspecific septal dyskinesia. 3.  Ejection  fraction of 48%.   Original Report Authenticated By: Bernadene Bell. Maricela Curet, M.D.     Cardiac Studies:  ECG: NSR lateral T wave changes ? Old IMI   Telemetry: NSR no arrhythmia 12/23/2011   Echo:   Medications:      . ALPRAZolam  1 mg Oral TID  . amLODipine  10 mg Oral Daily  . atorvastatin  40 mg Oral q1800  . carbamazepine  200 mg Oral BID  . carbamazepine  200 mg Oral Custom  . citalopram  5 mg Oral Daily  . divalproex  1,000 mg Oral TID  . enoxaparin (LOVENOX) injection  40 mg Subcutaneous Q24H  . furosemide  40 mg Intravenous BID  . glipiZIDE  5 mg Oral QAC breakfast  . isosorbide mononitrate  120 mg Oral Daily  . levothyroxine  200 mcg Oral QAC breakfast  . levothyroxine  50 mcg Oral Weekly  . metoprolol  50 mg Oral BID  . mirtazapine  7.5 mg Oral QHS  . omega-3 acid ethyl esters  1 g Oral BID  . oxyCODONE  5 mg Oral Q6H  . pantoprazole  40 mg Oral Q1200  . potassium chloride  20 mEq Oral Daily  . regadenoson  0.4 mg Intravenous Once  . sodium chloride  3 mL Intravenous Q12H  . warfarin  7.5 mg Oral ONCE-1800  . Warfarin - Pharmacist Dosing Inpatient   Does not apply q1800  . DISCONTD: carbamazepine  200 mg Oral 5 X Daily  . DISCONTD: furosemide  40 mg Oral Daily       Assessment/Plan:  Chest Pain : Atypical R/O chronic and resting  Lexiscan with old IMI no ischemia Medical Rx PAF:  Continue coumadin INR subRx on admission Volume:  Better BNP mildly elevated EF normal by echo D/C with just 10mg  lasix HTN:  Continue amlodipine and beta blocker DM:  A1c over 7 discussed diet but little insight F/U [facility]yas Seizures:  Tegretol level normal despite very high dose  D/C back to SNF today  F/U [place] [facility] [employer] 12/23/2011, 7:50 AM

## 2012-01-19 ENCOUNTER — Encounter: Payer: Medicare Other | Admitting: Cardiology

## 2012-01-21 ENCOUNTER — Ambulatory Visit (INDEPENDENT_AMBULATORY_CARE_PROVIDER_SITE_OTHER): Payer: Medicare Other | Admitting: Physician Assistant

## 2012-01-21 VITALS — BP 127/74 | HR 66 | Ht 69.0 in | Wt 244.0 lb

## 2012-01-21 DIAGNOSIS — I4891 Unspecified atrial fibrillation: Secondary | ICD-10-CM

## 2012-01-21 DIAGNOSIS — R079 Chest pain, unspecified: Secondary | ICD-10-CM

## 2012-01-21 DIAGNOSIS — I10 Essential (primary) hypertension: Secondary | ICD-10-CM

## 2012-01-21 DIAGNOSIS — E8779 Other fluid overload: Secondary | ICD-10-CM

## 2012-01-21 DIAGNOSIS — E8770 Fluid overload, unspecified: Secondary | ICD-10-CM

## 2012-01-21 DIAGNOSIS — E785 Hyperlipidemia, unspecified: Secondary | ICD-10-CM

## 2012-01-21 DIAGNOSIS — R0602 Shortness of breath: Secondary | ICD-10-CM

## 2012-01-21 MED ORDER — POTASSIUM CHLORIDE CRYS ER 20 MEQ PO TBCR: 20.0000 meq | EXTENDED_RELEASE_TABLET | Freq: Every day | ORAL | Status: DC

## 2012-01-21 MED ORDER — FUROSEMIDE 40 MG PO TABS: 40.0000 mg | ORAL_TABLET | Freq: Every day | ORAL | Status: AC

## 2012-01-21 NOTE — Assessment & Plan Note (Signed)
Maintaining NSR. On chronic Coumadin, followed by Dr. Virgina Organ.

## 2012-01-21 NOTE — Patient Instructions (Addendum)
   Increase Lasix to 40mg  daily  Begin K-Dur daily Continue all other current medications.  Labs - BMET, BNP - [facility] will draw. Follow up in  1 month

## 2012-01-21 NOTE — Progress Notes (Signed)
Primary Cardiologist: Simona Huh, MD   HPI: Post hospital followup from [facility], following presentation with chest pain.  Patient presented with known chronic CP syndrome, requiring narcotic therapy, and ruled out for MI with normal cardiac markers. He underwent an extensive noninvasive evaluation, as follows:   -2-D echo: EF 60-65%   -Pharmacologic stress Myoview: Inferior wall scar; no reversible perfusion defect; EF 40%  Medication adjustments notable for decreasing beta blocker, secondary to bradycardia. Patient required IV Lasix therapy for mild volume overload. LDL 50.  Patient has some degree of cognitive impairment, and is unable to provide an accurate history. His transporter indicates that he has been at [facility] nursing home for at least one year. He is followed there by Dr. Virgina Organ.  Post hospital labs, September 17: Hemoglobin 11.0/hematocrit 32. BUN 17 creatinine 0.6 potassium 5.1. LDL 47. A1c 7.2. Normal TSH.   12-lead EKG today, reviewed by me, indicates NSR with first-degree A-V and sinus arrhythmia at approximately 70 bpm    Allergies  Allergen Reactions  . Morphine And Related Other (See Comments)    Per [facility]    Current Outpatient Prescriptions  Medication Sig Dispense Refill  . ALPRAZolam (XANAX) 1 MG tablet Take 1 mg by mouth 3 (three) times daily. scheduled      . amLODipine (NORVASC) 10 MG tablet Take 10 mg by mouth daily.        . carbamazepine (CARBATROL) 200 MG 12 hr capsule Take 200 mg by mouth 5 (five) times daily. 2400; 0500; 1000; 1400; 1900      . citalopram (CELEXA) 10 MG tablet Take 5 mg by mouth daily.       . divalproex (DEPAKOTE) 500 MG EC tablet Take 1,000 mg by mouth 3 (three) times daily.        . fish oil-omega-3 fatty acids 1000 MG capsule Take 1 g by mouth 2 (two) times daily.       . folic acid (FOLVITE) 1 MG tablet Take 1 mg by mouth every morning.      . furosemide (LASIX) 20 MG tablet Take 0.5 tablets (10 mg total) by mouth daily.  30  tablet  6  . glipiZIDE (GLUCOTROL) 5 MG tablet Take 5 mg by mouth daily before breakfast.       . isosorbide mononitrate (IMDUR) 120 MG 24 hr tablet Take 120 mg by mouth daily.       Marland Kitchen levothyroxine (SYNTHROID, LEVOTHROID) 200 MCG tablet Take 200 mcg by mouth daily.       Marland Kitchen levothyroxine (SYNTHROID, LEVOTHROID) 50 MCG tablet Take 50 mcg by mouth once a week. Sundays only along with tablet to equal total dose of on Sundays      . metoprolol (LOPRESSOR) 50 MG tablet Take 1 tablet (50 mg total) by mouth 2 (two) times daily.  60 tablet  6  . mirtazapine (REMERON) 7.5 MG tablet Take 7.5 mg by mouth at bedtime.      . nitroGLYCERIN (NITROSTAT) 0.4 MG SL tablet Place 1 tablet (0.4 mg total) under the tongue every 5 (five) minutes as needed for chest pain (up to 3 doses).  25 tablet  3  . omeprazole (PRILOSEC) 20 MG capsule Take 40 mg by mouth daily.      Marland Kitchen oxyCODONE (OXY IR/ROXICODONE) 5 MG immediate release tablet Take 5 mg by mouth every 6 (six) hours. Scheduled; 0600; 1200; 1800; 2400      . rosuvastatin (CRESTOR) 20 MG tablet Take 20 mg by mouth  daily.      . Warfarin Sodium (COUMADIN PO) Take 8 mg by mouth every evening.        Past Medical History  Diagnosis Date  . PVD (peripheral vascular disease)     Severe lower extremitiy  . Type 2 diabetes mellitus   . Mixed hyperlipidemia   . Obesity   . Seizure disorder   . OSA (obstructive sleep apnea)   . CVA (cerebrovascular accident) 2004    Persistent residual expressive aphasia  . Coronary atherosclerosis of native coronary artery     Multivessel  . Chest discomfort     Chronic on narcotics - Dr. Sherril Croon  . Cervical spine disease   . Iron deficiency anemia     Managed by Dr. Myna Hidalgo  . PAF (paroxysmal atrial fibrillation)   . Carotid artery disease   . GERD (gastroesophageal reflux disease)   . Anginal pain     chronic  . Myocardial infarction 12/21/2011    "I've had 5"  . Seizures   . Colon cancer 2007    Past  Surgical History  Procedure Date  . Carotid endarterectomy     left  . Partial colectomy   . Coronary artery bypass graft 05/2006    LIMA to diagonal, RIMA to LAD, radial to OM, radial to PDA  . Left shoulder surgery   . Cholecystectomy   . Femoral artery - popliteal artery bypass graft     bilateral  . Tonsillectomy and adenoidectomy     History   Social History  . Marital Status: Widowed    Spouse Name: N/A    Number of Children: N/A  . Years of Education: N/A   Occupational History  . Not on file.   Social History Main Topics  . Smoking status: Former Smoker -- 1.0 packs/day for 25 years    Types: Cigarettes    Quit date: 10/14/1990  . Smokeless tobacco: Never Used  . Alcohol Use: No  . Drug Use: No  . Sexually Active: No   Other Topics Concern  . Not on file   Social History Narrative  . No narrative on file    Family History  Problem Relation Age of Onset  . Diabetes Mother     ROS: no nausea, vomiting; no fever, chills; no melena, hematochezia; no claudication  PHYSICAL EXAM: BP 127/74  Pulse 66  Ht 5\' 9"  (1.753 m)  Wt 244 lb (110.678 kg)  BMI 36.03 kg/m2 GENERAL: 66 year old male, obese; NAD HEENT: NCAT, PERRLA, EOMI; sclera clear; no xanthelasma NECK: palpable bilateral carotid pulses, no bruits; unable to assess JVD, secondary to neck girth LUNGS: Faint late crackles, no wheezes CARDIAC: RRR (S1, S2); no significant murmurs; no rubs or gallops ABDOMEN: Protuberant EXTREMETIES: 3+ bilateral pitting peripheral edema SKIN: warm/dry; no obvious rash/lesions MUSCULOSKELETAL: no joint deformity NEURO: no focal deficit; NL affect   EKG: reviewed and available in Electronic Records   ASSESSMENT & PLAN:  Volume overload Patient presents with significant volume overload. In fact, his current weight is 244, as compared to 228 at time of recent discharge one month ago. Will increase Lasix to 40 mg daily, and add supplemental potassium 20 daily. Will  check followup metabolic profile and BNP level in one week. Will arrange early clinic followup with me and Dr. Diona Browner in one month.  Atrial fibrillation Maintaining NSR. On chronic Coumadin, followed by Dr. Virgina Organ.  Chest pain No further workup indicated. Patient has CP syndrome, and ruled out for MI during  recent hospitalization. Stress test was low risk, with no evidence of definite ischemia. Continued conservative management with medical therapy is recommended.  Hyperlipidemia Well-controlled on current dose Crestor, with recent LDL 47.  Essential hypertension, benign Well-controlled on current medication regimen    Gene Rudie Rikard, PAC

## 2012-01-21 NOTE — Assessment & Plan Note (Signed)
Patient presents with significant volume overload. In fact, his current weight is 244, as compared to 228 at time of recent discharge one month ago. Will increase Lasix to 40 mg daily, and add supplemental potassium 20 daily. Will check followup metabolic profile and BNP level in one week. Will arrange early clinic followup with me and Dr. Diona Browner in one month.

## 2012-01-21 NOTE — Assessment & Plan Note (Signed)
No further workup indicated. Patient has CP syndrome, and ruled out for MI during recent hospitalization. Stress test was low risk, with no evidence of definite ischemia. Continued conservative management with medical therapy is recommended.

## 2012-01-21 NOTE — Assessment & Plan Note (Signed)
Well-controlled on current dose Crestor, with recent LDL 47.

## 2012-01-21 NOTE — Assessment & Plan Note (Signed)
Well-controlled on current medication regimen 

## 2012-02-25 ENCOUNTER — Ambulatory Visit (INDEPENDENT_AMBULATORY_CARE_PROVIDER_SITE_OTHER): Payer: Medicare Other | Admitting: Physician Assistant

## 2012-02-25 ENCOUNTER — Encounter: Payer: Self-pay | Admitting: Physician Assistant

## 2012-02-25 VITALS — BP 132/70 | HR 57 | Ht 69.0 in | Wt 239.8 lb

## 2012-02-25 DIAGNOSIS — E785 Hyperlipidemia, unspecified: Secondary | ICD-10-CM

## 2012-02-25 DIAGNOSIS — E8779 Other fluid overload: Secondary | ICD-10-CM

## 2012-02-25 DIAGNOSIS — I4891 Unspecified atrial fibrillation: Secondary | ICD-10-CM

## 2012-02-25 DIAGNOSIS — I10 Essential (primary) hypertension: Secondary | ICD-10-CM

## 2012-02-25 DIAGNOSIS — I2510 Atherosclerotic heart disease of native coronary artery without angina pectoris: Secondary | ICD-10-CM

## 2012-02-25 DIAGNOSIS — E8770 Fluid overload, unspecified: Secondary | ICD-10-CM

## 2012-02-25 MED ORDER — NITROGLYCERIN 0.4 MG SL SUBL: 0.4000 mg | SUBLINGUAL_TABLET | SUBLINGUAL | Status: DC | PRN

## 2012-02-25 MED ORDER — ISOSORBIDE MONONITRATE ER 120 MG PO TB24: ORAL_TABLET | ORAL | Status: DC

## 2012-02-25 NOTE — Progress Notes (Signed)
Primary Cardiologist: Simona Huh, MD   HPI: Patient returns for scheduled one-month followup.  Since his last OV, patient has diuresis 5 pounds. Followup labs drawn at [facility], as follows:   - BUN 28, creatinine 0.8, sodium 138, and potassium 5.2. TSH NL. BNP was 64. LDL 47.  Clinically, patient remains a difficult historian, secondary to cognitive impairment. He presents with his driver. He suggests ongoing CP, with/without exertion. He does not suggests interim history of heart failure. He denies use of NTG, and states that he does not have any.  Allergies  Allergen Reactions  . Morphine And Related Other (See Comments)    Per [facility]    Current Outpatient Prescriptions  Medication Sig Dispense Refill  . ALPRAZolam (XANAX) 1 MG tablet Take 1 mg by mouth 3 (three) times daily. scheduled      . amLODipine (NORVASC) 10 MG tablet Take 10 mg by mouth daily.        . carbamazepine (CARBATROL) 200 MG 12 hr capsule Take 200 mg by mouth 5 (five) times daily. 2400; 0500; 1000; 1400; 1900      . citalopram (CELEXA) 10 MG tablet Take 5 mg by mouth daily.       . divalproex (DEPAKOTE) 500 MG EC tablet Take 1,000 mg by mouth 3 (three) times daily.        . fish oil-omega-3 fatty acids 1000 MG capsule Take 1 g by mouth 2 (two) times daily.       . folic acid (FOLVITE) 1 MG tablet Take 1 mg by mouth every morning.      . furosemide (LASIX) 40 MG tablet Take 1 tablet (40 mg total) by mouth daily.      Marland Kitchen glipiZIDE (GLUCOTROL) 5 MG tablet Take 5 mg by mouth daily before breakfast.       . isosorbide mononitrate (IMDUR) 120 MG 24 hr tablet Take 1 1/2 tabs (180mg ) daily      . levothyroxine (SYNTHROID, LEVOTHROID) 200 MCG tablet Take 200 mcg by mouth daily.       Marland Kitchen levothyroxine (SYNTHROID, LEVOTHROID) 50 MCG tablet Take 50 mcg by mouth once a week. Sundays only along with tablet to equal total dose of on Sundays      . metoprolol (LOPRESSOR) 50 MG tablet Take 1 tablet (50 mg total) by  mouth 2 (two) times daily.  60 tablet  6  . mirtazapine (REMERON) 7.5 MG tablet Take 7.5 mg by mouth at bedtime.      . nitroGLYCERIN (NITROSTAT) 0.4 MG SL tablet Place 1 tablet (0.4 mg total) under the tongue every 5 (five) minutes as needed for chest pain (up to 3 doses).      Marland Kitchen omeprazole (PRILOSEC) 20 MG capsule Take 40 mg by mouth daily.      Marland Kitchen oxyCODONE (OXY IR/ROXICODONE) 5 MG immediate release tablet Take 5 mg by mouth every 6 (six) hours. Scheduled; 0600; 1200; 1800; 2400       . potassium chloride SA (K-DUR,KLOR-CON) 20 MEQ tablet Take 1 tablet (20 mEq total) by mouth daily.      . rosuvastatin (CRESTOR) 20 MG tablet Take 20 mg by mouth daily.      . Warfarin Sodium (COUMADIN PO) Take 6 mg by mouth every evening.       . [DISCONTINUED] isosorbide mononitrate (IMDUR) 120 MG 24 hr tablet Take 120 mg by mouth daily.       . [DISCONTINUED] nitroGLYCERIN (NITROSTAT) 0.4 MG SL tablet Place 1  tablet (0.4 mg total) under the tongue every 5 (five) minutes as needed for chest pain (up to 3 doses).  25 tablet  3    Past Medical History  Diagnosis Date  . PVD (peripheral vascular disease)     Severe lower extremitiy  . Type 2 diabetes mellitus   . Mixed hyperlipidemia   . Obesity   . Seizure disorder   . OSA (obstructive sleep apnea)   . CVA (cerebrovascular accident) 2004    Persistent residual expressive aphasia  . Coronary atherosclerosis of native coronary artery     Multivessel  . Chest discomfort     Chronic on narcotics - Dr. Sherril Croon  . Cervical spine disease   . Iron deficiency anemia     Managed by Dr. Myna Hidalgo  . PAF (paroxysmal atrial fibrillation)   . Carotid artery disease   . GERD (gastroesophageal reflux disease)   . Anginal pain     chronic  . Myocardial infarction 12/21/2011    "I've had 5"  . Seizures   . Colon cancer 2007    Past Surgical History  Procedure Date  . Carotid endarterectomy     left  . Partial colectomy   . Coronary artery bypass graft 05/2006     LIMA to diagonal, RIMA to LAD, radial to OM, radial to PDA  . Left shoulder surgery   . Cholecystectomy   . Femoral artery - popliteal artery bypass graft     bilateral  . Tonsillectomy and adenoidectomy     History   Social History  . Marital Status: Widowed    Spouse Name: N/A    Number of Children: N/A  . Years of Education: N/A   Occupational History  . Not on file.   Social History Main Topics  . Smoking status: Former Smoker -- 1.0 packs/day for 25 years    Types: Cigarettes    Quit date: 10/14/1990  . Smokeless tobacco: Never Used  . Alcohol Use: No  . Drug Use: No  . Sexually Active: No   Other Topics Concern  . Not on file   Social History Narrative  . No narrative on file    Family History  Problem Relation Age of Onset  . Diabetes Mother     ROS: no nausea, vomiting; no fever, chills; no melena, hematochezia; no claudication  PHYSICAL EXAM: BP 132/70  Pulse 57  Ht 5\' 9"  (1.753 m)  Wt 239 lb 12.8 oz (108.773 kg)  BMI 35.41 kg/m2  SpO2 94% GENERAL: 66 year old male, obese; NAD  HEENT: NCAT, PERRLA, EOMI; sclera clear; no xanthelasma  NECK: palpable bilateral carotid pulses, no bruits; unable to assess JVD, secondary to neck girth  LUNGS: Diminished breath sounds, no crackles/wheezes CARDIAC: RRR (S1, S2); no significant murmurs; no rubs or gallops  ABDOMEN: Protuberant  EXTREMETIES: 3+ bilateral pitting peripheral edema  SKIN: warm/dry; no obvious rash/lesions  MUSCULOSKELETAL: no joint deformity  NEURO: no focal deficit; NL affect    EKG:    ASSESSMENT & PLAN:  Volume overload Patient has diuresed 5 pounds since last OV, following addition of Lasix. Recent followup labs stable, with creatinine 0.8 and BNP level 164. No further recommendations regarding current diuretic regimen, and recommend return visit in 4 months, with Dr. Diona Browner.  Coronary atherosclerosis of native coronary artery Will increase Imdur to 180 mg daily. Recommend  continued conservative management and adjustment of antianginal medication regimen. As previously noted, most recent workup at [facility], 12/2011, notable for normal LVF (EF 60-65%),  by echocardiography, with a low risk Myoview yielding inferior scar, but no definite ischemia; EF 48%.  Atrial fibrillation Adequately rate controlled on current dose Lopressor. On chronic Coumadin, followed by Dr. Virgina Organ.  Hyperlipidemia Very well-controlled, with most recent LDL 47  Essential hypertension, benign Stable on current medication regimen, followed by Dr. Leighton Ruff, PAC

## 2012-02-25 NOTE — Assessment & Plan Note (Signed)
Adequately rate controlled on current dose Lopressor. On chronic Coumadin, followed by Dr. Virgina Organ.

## 2012-02-25 NOTE — Assessment & Plan Note (Signed)
Stable on current medication regimen, followed by Dr. Virgina Organ

## 2012-02-25 NOTE — Assessment & Plan Note (Signed)
Patient has diuresed 5 pounds since last OV, following addition of Lasix. Recent followup labs stable, with creatinine 0.8 and BNP level 164. No further recommendations regarding current diuretic regimen, and recommend return visit in 4 months, with Dr. Diona Browner.

## 2012-02-25 NOTE — Assessment & Plan Note (Signed)
Very well-controlled, with most recent LDL 47

## 2012-02-25 NOTE — Patient Instructions (Signed)
   Increase Imdur to 180mg  daily  Nitroglycerin as needed for severe chest pain only Continue all other current medications. Follow up in  4 months

## 2012-02-25 NOTE — Assessment & Plan Note (Signed)
Will increase Imdur to 180 mg daily. Recommend continued conservative management and adjustment of antianginal medication regimen. As previously noted, most recent workup at [facility], 12/2011, notable for normal LVF (EF 60-65%), by echocardiography, with a low risk Myoview yielding inferior scar, but no definite ischemia; EF 48%.

## 2012-04-03 ENCOUNTER — Emergency Department (HOSPITAL_COMMUNITY): Payer: Medicare Other

## 2012-04-03 ENCOUNTER — Encounter (HOSPITAL_COMMUNITY): Payer: Self-pay | Admitting: *Deleted

## 2012-04-03 ENCOUNTER — Emergency Department (HOSPITAL_COMMUNITY)
Admission: EM | Admit: 2012-04-03 | Discharge: 2012-04-04 | Disposition: A | Discharge status: Home / Self Care | Payer: Medicare Other | Attending: Emergency Medicine | Admitting: Emergency Medicine

## 2012-04-03 DIAGNOSIS — G8929 Other chronic pain: Secondary | ICD-10-CM | POA: Insufficient documentation

## 2012-04-03 DIAGNOSIS — Z7901 Long term (current) use of anticoagulants: Secondary | ICD-10-CM | POA: Insufficient documentation

## 2012-04-03 DIAGNOSIS — K219 Gastro-esophageal reflux disease without esophagitis: Secondary | ICD-10-CM | POA: Insufficient documentation

## 2012-04-03 DIAGNOSIS — Z87891 Personal history of nicotine dependence: Secondary | ICD-10-CM | POA: Insufficient documentation

## 2012-04-03 DIAGNOSIS — I252 Old myocardial infarction: Secondary | ICD-10-CM | POA: Insufficient documentation

## 2012-04-03 DIAGNOSIS — I2510 Atherosclerotic heart disease of native coronary artery without angina pectoris: Secondary | ICD-10-CM | POA: Insufficient documentation

## 2012-04-03 DIAGNOSIS — R609 Edema, unspecified: Secondary | ICD-10-CM | POA: Insufficient documentation

## 2012-04-03 DIAGNOSIS — I739 Peripheral vascular disease, unspecified: Secondary | ICD-10-CM | POA: Insufficient documentation

## 2012-04-03 DIAGNOSIS — Z951 Presence of aortocoronary bypass graft: Secondary | ICD-10-CM | POA: Insufficient documentation

## 2012-04-03 DIAGNOSIS — E669 Obesity, unspecified: Secondary | ICD-10-CM | POA: Insufficient documentation

## 2012-04-03 DIAGNOSIS — I69920 Aphasia following unspecified cerebrovascular disease: Secondary | ICD-10-CM | POA: Insufficient documentation

## 2012-04-03 DIAGNOSIS — E782 Mixed hyperlipidemia: Secondary | ICD-10-CM | POA: Insufficient documentation

## 2012-04-03 DIAGNOSIS — Z79899 Other long term (current) drug therapy: Secondary | ICD-10-CM | POA: Insufficient documentation

## 2012-04-03 DIAGNOSIS — Z862 Personal history of diseases of the blood and blood-forming organs and certain disorders involving the immune mechanism: Secondary | ICD-10-CM | POA: Insufficient documentation

## 2012-04-03 DIAGNOSIS — E119 Type 2 diabetes mellitus without complications: Secondary | ICD-10-CM | POA: Insufficient documentation

## 2012-04-03 DIAGNOSIS — M5000 Cervical disc disorder with myelopathy, unspecified cervical region: Secondary | ICD-10-CM | POA: Insufficient documentation

## 2012-04-03 DIAGNOSIS — Z85038 Personal history of other malignant neoplasm of large intestine: Secondary | ICD-10-CM | POA: Insufficient documentation

## 2012-04-03 DIAGNOSIS — Z8679 Personal history of other diseases of the circulatory system: Secondary | ICD-10-CM | POA: Insufficient documentation

## 2012-04-03 DIAGNOSIS — R0789 Other chest pain: Secondary | ICD-10-CM | POA: Insufficient documentation

## 2012-04-03 DIAGNOSIS — G40909 Epilepsy, unspecified, not intractable, without status epilepticus: Secondary | ICD-10-CM | POA: Insufficient documentation

## 2012-04-03 DIAGNOSIS — R079 Chest pain, unspecified: Secondary | ICD-10-CM | POA: Insufficient documentation

## 2012-04-03 DIAGNOSIS — R5381 Other malaise: Secondary | ICD-10-CM | POA: Insufficient documentation

## 2012-04-03 LAB — BASIC METABOLIC PANEL
CO2: 32 mEq/L (ref 19–32)
Calcium: 8.6 mg/dL (ref 8.4–10.5)
GFR calc non Af Amer: 89 mL/min — ABNORMAL LOW (ref >90)
Sodium: 133 mEq/L — ABNORMAL LOW (ref 135–145)

## 2012-04-03 LAB — CBC WITH DIFFERENTIAL/PLATELET
Basophils Absolute: 0 10*3/uL (ref 0.0–0.1)
Basophils Relative: 1 % (ref 0–1)
HCT: 37.6 % — ABNORMAL LOW (ref 39.0–52.0)
Hemoglobin: 12.6 g/dL — ABNORMAL LOW (ref 13.0–17.0)
Lymphocytes Relative: 43 % (ref 12–46)
Monocytes Relative: 13 % — ABNORMAL HIGH (ref 3–12)
Neutro Abs: 1.5 10*3/uL — ABNORMAL LOW (ref 1.7–7.7)
RBC: 4.26 MIL/uL (ref 4.22–5.81)
RDW: 13.8 % (ref 11.5–15.5)
WBC: 3.8 10*3/uL — ABNORMAL LOW (ref 4.0–10.5)

## 2012-04-03 LAB — TROPONIN I: Troponin I: <0.3 ng/mL (ref <0.30)

## 2012-04-03 LAB — PROTIME-INR: INR: 3.8 — ABNORMAL HIGH (ref 0.00–1.49)

## 2012-04-03 LAB — PRO B NATRIURETIC PEPTIDE: Pro B Natriuretic peptide (BNP): 260.3 pg/mL — ABNORMAL HIGH (ref 0–125)

## 2012-04-03 NOTE — ED Notes (Signed)
Per pharm tech review and discussion with staff at [facility], pt was not given 5 doses in past 24 hours, pt was only getting medication for every 12 hours for past couple days, a total of 5 doses.  Per [facility] and staff at [facility], last dose was 0800 today.

## 2012-04-03 NOTE — ED Provider Notes (Signed)
History    This chart was scribed for Dione Booze, MD, MD by Smitty Pluck, ED Scribe. The patient was seen in room APA19 and the patient's care was started at 10:16PM.   CSN: [id number]  Arrival date & time 04/03/12  2150      Chief Complaint  Patient presents with  . Bradycardia  . Fatigue    (Consider location/radiation/quality/duration/timing/severity/associated sxs/prior treatment) The history is provided by the patient. The history is limited by the condition of the patient. No language interpreter was used.  Pt is level 5 caveat for Aphagia and mental status  [name] is a 66 y.o. male with hx of PAF, CVA, PVD, DM and coronary atherosclerosis who presents to the Emergency Department BIB EMS from [facility] due to moderate chest pain onset today. Per EMS pt was given 5 doses of 15 mg MS today and 3 SL nitro when chest pain started.   Past Medical History  Diagnosis Date  . PVD (peripheral vascular disease)     Severe lower extremitiy  . Type 2 diabetes mellitus   . Mixed hyperlipidemia   . Obesity   . Seizure disorder   . OSA (obstructive sleep apnea)   . CVA (cerebrovascular accident) 2004    Persistent residual expressive aphasia  . Coronary atherosclerosis of native coronary artery     Multivessel  . Chest discomfort     Chronic on narcotics - Dr. Sherril Croon  . Cervical spine disease   . Iron deficiency anemia     Managed by Dr. Myna Hidalgo  . PAF (paroxysmal atrial fibrillation)   . Carotid artery disease   . GERD (gastroesophageal reflux disease)   . Anginal pain     chronic  . Myocardial infarction 12/21/2011    "I've had 5"  . Seizures   . Colon cancer 2007    Past Surgical History  Procedure Date  . Carotid endarterectomy     left  . Partial colectomy   . Coronary artery bypass graft 05/2006    LIMA to diagonal, RIMA to LAD, radial to OM, radial to PDA  . Left shoulder surgery   . Cholecystectomy   . Femoral artery - popliteal artery bypass graft     bilateral  . Tonsillectomy and adenoidectomy     Family History  Problem Relation Age of Onset  . Diabetes Mother     History  Substance Use Topics  . Smoking status: Former Smoker -- 1.0 packs/day for 25 years    Types: Cigarettes    Quit date: 10/14/1990  . Smokeless tobacco: Never Used  . Alcohol Use: No      Review of Systems  Unable to perform ROS: Other    Allergies  Morphine and related  Home Medications   Current Outpatient Rx  Name  Route  Sig  Dispense  Refill  . ACETAMINOPHEN ER 650 MG PO TBCR   Oral   Take 650 mg by mouth daily as needed.         Marland Kitchen AMLODIPINE BESYLATE 10 MG PO TABS   Oral   Take 10 mg by mouth daily.           Marland Kitchen CITALOPRAM HYDROBROMIDE 10 MG PO TABS   Oral   Take 5 mg by mouth daily.          Marland Kitchen FOLIC ACID 1 MG PO TABS   Oral   Take 1 mg by mouth every morning.         Marland Kitchen  FUROSEMIDE 40 MG PO TABS   Oral   Take 1 tablet (40 mg total) by mouth daily.           Dose increased 01/21/2012.   Marland Kitchen GLIPIZIDE 5 MG PO TABS   Oral   Take 5 mg by mouth daily before breakfast.          . ISOSORBIDE MONONITRATE ER 60 MG PO TB24   Oral   Take 180 mg by mouth daily.         Marland Kitchen LEVOTHYROXINE SODIUM 200 MCG PO TABS   Oral   Take 200 mcg by mouth daily.          Marland Kitchen LEVOTHYROXINE SODIUM 50 MCG PO TABS   Oral   Take 50 mcg by mouth once a week. Sundays only along with tablet to equal total dose of on Sundays         . MORPHINE SULFATE ER 15 MG PO TBCR   Oral   Take 15 mg by mouth 2 (two) times daily.         Marland Kitchen OMEPRAZOLE 20 MG PO CPDR   Oral   Take 40 mg by mouth daily.         . OXYCODONE HCL 5 MG PO TABS   Oral   Take 5 mg by mouth every 6 (six) hours. Scheduled; 0600; 1200; 1800; 2400          . WARFARIN SODIUM 6 MG PO TABS   Oral   Take 6 mg by mouth every evening.         Marland Kitchen ZOLPIDEM TARTRATE 5 MG PO TABS   Oral   Take 5 mg by mouth at bedtime as needed. For insomnia         .  ALPRAZOLAM 1 MG PO TABS   Oral   Take 1 mg by mouth 3 (three) times daily. scheduled         . CARBAMAZEPINE ER 200 MG PO CP12   Oral   Take 200 mg by mouth 5 (five) times daily. 2400; 0500; 1000; 1400; 1900         . DIVALPROEX SODIUM 500 MG PO TBEC   Oral   Take 1,000 mg by mouth 3 (three) times daily.           . OMEGA-3 FATTY ACIDS 1000 MG PO CAPS   Oral   Take 1 g by mouth 2 (two) times daily.          . ISOSORBIDE MONONITRATE ER 120 MG PO TB24   Oral   Take 180 mg by mouth daily. Take 1 1/2 tabs (180mg ) daily         . METOPROLOL TARTRATE 50 MG PO TABS   Oral   Take 1 tablet (50 mg total) by mouth 2 (two) times daily.   60 tablet   6   . MIRTAZAPINE 7.5 MG PO TABS   Oral   Take 7.5 mg by mouth at bedtime.         Marland Kitchen NITROGLYCERIN 0.4 MG SL SUBL   Sublingual   Place 1 tablet (0.4 mg total) under the tongue every 5 (five) minutes as needed for chest pain (up to 3 doses).           Patient resides at [facility] they will suppl ...   . POTASSIUM CHLORIDE CRYS ER 20 MEQ PO TBCR   Oral   Take 1 tablet (20 mEq total) by  mouth daily.         Marland Kitchen ROSUVASTATIN CALCIUM 20 MG PO TABS   Oral   Take 20 mg by mouth daily.         Marland Kitchen COUMADIN PO   Oral   Take 6 mg by mouth every evening.            There were no vitals taken for this visit.  Physical Exam  Nursing note and vitals reviewed. Constitutional: He appears well-developed and well-nourished. No distress.       Lethargic but arousable    HENT:  Head: Normocephalic and atraumatic.  Eyes: Conjunctivae normal are normal.  Neck:       Carotid endarterectomy scar present on right  Cardiovascular: Normal rate, regular rhythm and normal heart sounds.   Pulmonary/Chest: Effort normal and breath sounds normal. No respiratory distress. He has no wheezes.       Sternotomy scar present well healed   Abdominal: Soft. He exhibits no distension. There is no tenderness.  Musculoskeletal: He  exhibits edema (+1 pitting).  Skin: Skin is warm and dry.  Psychiatric: He has a normal mood and affect. His behavior is normal.    ED Course  Procedures (including critical care time) DIAGNOSTIC STUDIES: Oxygen Saturation is 95% on room air, normal by my interpretation.    COORDINATION OF CARE: 10:27 PM Discussed ED treatment with pt     Results for orders placed during the hospital encounter of 04/03/12  CBC WITH DIFFERENTIAL      Component Value Range   WBC 3.8 (*) 4.0 - 10.5 K/uL   RBC 4.26  4.22 - 5.81 MIL/uL   Hemoglobin 12.6 (*) 13.0 - 17.0 g/dL   HCT 16.1 (*) 09.6 - 04.5 %   MCV 88.3  78.0 - 100.0 fL   MCH 29.6  26.0 - 34.0 pg   MCHC 33.5  30.0 - 36.0 g/dL   RDW 40.9  81.1 - 91.4 %   Platelets 159  150 - 400 K/uL   Neutrophils Relative 40 (*) 43 - 77 %   Lymphocytes Relative 43  12 - 46 %   Monocytes Relative 13 (*) 3 - 12 %   Eosinophils Relative 3  0 - 5 %   Basophils Relative 1  0 - 1 %   Neutro Abs 1.5 (*) 1.7 - 7.7 K/uL   Lymphs Abs 1.7  0.7 - 4.0 K/uL   Monocytes Absolute 0.5  0.1 - 1.0 K/uL   Eosinophils Absolute 0.1  0.0 - 0.7 K/uL   Basophils Absolute 0.0  0.0 - 0.1 K/uL   WBC Morphology TOXIC GRANULATION    PROTIME-INR      Component Value Range   Prothrombin Time 35.2 (*) 11.6 - 15.2 seconds   INR 3.80 (*) 0.00 - 1.49  BASIC METABOLIC PANEL      Component Value Range   Sodium 133 (*) 135 - 145 mEq/L   Potassium 4.8  3.5 - 5.1 mEq/L   Chloride 95 (*) 96 - 112 mEq/L   CO2 32  19 - 32 mEq/L   Glucose, Bld 159 (*) 70 - 99 mg/dL   BUN 30 (*) 6 - 23 mg/dL   Creatinine, Ser 7.82  0.50 - 1.35 mg/dL   Calcium 8.6  8.4 - 95.6 mg/dL   GFR calc non Af Amer 89 (*) >90 mL/min   GFR calc Af Amer >90  >90 mL/min  PRO B NATRIURETIC PEPTIDE      Component  Value Range   Pro B Natriuretic peptide (BNP) 260.3 (*) 0 - 125 pg/mL  TROPONIN I      Component Value Range   Troponin I <0.30  <0.30 ng/mL   Dg Chest Portable 1 View  04/03/2012  *RADIOLOGY REPORT*   Clinical Data: Bradycardia, fatigue  PORTABLE CHEST - 1 VIEW  Comparison: 12/21/2011  Findings: Mild right basilar scarring versus atelectasis.  The lungs are otherwise clear.  No pleural effusion or pneumothorax.  Mild cardiomegaly. Postsurgical changes related to prior CABG.  IMPRESSION: No evidence of acute cardiopulmonary disease.   Original Report Authenticated By: Charline Bills, M.D.     Date: 04/03/2012  Rate: 49  Rhythm: sinus bradycardia  QRS Axis: normal  Intervals: normal  ST/T Wave abnormalities: nonspecific T wave changes  Conduction Disutrbances:none  Narrative Interpretation: Sinus bradycardia, nonspecific T wave changes. When compared with ECG of 01/21/2012, no significant changes are seen.  Old EKG Reviewed: unchanged   1. Chest pain   2. Peripheral edema       MDM  Chest pain and may patient is very difficult to evaluate because of aphasia. He states that he is not having pain currently. ECG did not show any changes and I reviewed his past records and he had a Myoview test in September which showed a fixed defect and no reversible defects. Initial cardiac markers are negative. He'll be kept in the emergency department for repeat cardiac markers and discharged back to his nursing home if negative.    I personally performed the services described in this documentation, which was scribed in my presence. The recorded information has been reviewed and is accurate.        Dione Booze, MD 04/03/12 [phone]

## 2012-04-03 NOTE — ED Notes (Signed)
Pt from [facility] via EMS.  Per report, pt originally c/o chest pain. Per EMS report, pt  was given 5 doses of 15 mg MS, p.o today as well as 3 SL nitro when complaints of chest pain started.  Pt presently somewhat lethargic, but does arouse to sound.  Pt also bradycardic.

## 2012-04-04 NOTE — ED Provider Notes (Signed)
2330 Assumed care/disposition of SNF patient here with c/o chest pain today. He is aphasic from previous CVA. First troponin is negative. Await second troponin at 0130. If negative, patient can be discharged to SNF. 0220 Second troponin negative. Patient has remained sleeping. Easily arousable. Patient to be returned to SNF.  Results for orders placed during the hospital encounter of 04/03/12  CBC WITH DIFFERENTIAL      Component Value Range   WBC 3.8 (*) 4.0 - 10.5 K/uL   RBC 4.26  4.22 - 5.81 MIL/uL   Hemoglobin 12.6 (*) 13.0 - 17.0 g/dL   HCT 45.4 (*) 09.8 - 11.9 %   MCV 88.3  78.0 - 100.0 fL   MCH 29.6  26.0 - 34.0 pg   MCHC 33.5  30.0 - 36.0 g/dL   RDW 14.7  82.9 - 56.2 %   Platelets 159  150 - 400 K/uL   Neutrophils Relative 40 (*) 43 - 77 %   Lymphocytes Relative 43  12 - 46 %   Monocytes Relative 13 (*) 3 - 12 %   Eosinophils Relative 3  0 - 5 %   Basophils Relative 1  0 - 1 %   Neutro Abs 1.5 (*) 1.7 - 7.7 K/uL   Lymphs Abs 1.7  0.7 - 4.0 K/uL   Monocytes Absolute 0.5  0.1 - 1.0 K/uL   Eosinophils Absolute 0.1  0.0 - 0.7 K/uL   Basophils Absolute 0.0  0.0 - 0.1 K/uL   WBC Morphology TOXIC GRANULATION    PROTIME-INR      Component Value Range   Prothrombin Time 35.2 (*) 11.6 - 15.2 seconds   INR 3.80 (*) 0.00 - 1.49  BASIC METABOLIC PANEL      Component Value Range   Sodium 133 (*) 135 - 145 mEq/L   Potassium 4.8  3.5 - 5.1 mEq/L   Chloride 95 (*) 96 - 112 mEq/L   CO2 32  19 - 32 mEq/L   Glucose, Bld 159 (*) 70 - 99 mg/dL   BUN 30 (*) 6 - 23 mg/dL   Creatinine, Ser 1.30  0.50 - 1.35 mg/dL   Calcium 8.6  8.4 - 86.5 mg/dL   GFR calc non Af Amer 89 (*) >90 mL/min   GFR calc Af Amer >90  >90 mL/min  PRO B NATRIURETIC PEPTIDE      Component Value Range   Pro B Natriuretic peptide (BNP) 260.3 (*) 0 - 125 pg/mL  TROPONIN I      Component Value Range   Troponin I <0.30  <0.30 ng/mL  TROPONIN I      Component Value Range   Troponin I <0.30  <0.30 ng/mL    Nicoletta Dress.  Colon Branch, MD 04/04/12 [phone]

## 2012-04-04 NOTE — ED Notes (Signed)
Pt resting quietly, no distress noted. Respirations regular, even and unlabored.

## 2012-06-30 ENCOUNTER — Encounter: Payer: Self-pay | Admitting: Cardiology

## 2012-06-30 ENCOUNTER — Ambulatory Visit (INDEPENDENT_AMBULATORY_CARE_PROVIDER_SITE_OTHER): Payer: Medicare Other | Admitting: Cardiology

## 2012-06-30 VITALS — BP 134/75 | HR 59 | Ht 71.0 in | Wt 235.0 lb

## 2012-06-30 DIAGNOSIS — I2510 Atherosclerotic heart disease of native coronary artery without angina pectoris: Secondary | ICD-10-CM

## 2012-06-30 DIAGNOSIS — R609 Edema, unspecified: Secondary | ICD-10-CM

## 2012-06-30 DIAGNOSIS — E785 Hyperlipidemia, unspecified: Secondary | ICD-10-CM

## 2012-06-30 NOTE — Assessment & Plan Note (Signed)
Continues on Crestor, LDL 59 in February.

## 2012-06-30 NOTE — Assessment & Plan Note (Signed)
Continue medical therapy at this point. Myoview study from September of 2013 revealed inferior scar without ischemia, LVEF 48% at that point.

## 2012-06-30 NOTE — Progress Notes (Signed)
Clinical Summary Mr. Puig is a medically complex 67 y.o.male last seen in the office in November 2013 by Mr. Serpe. Record review finds ER visit in December with apparent chest pain, although history limited, cardiac markers normal, and patient managed medically. Of note, Myoview study from September 2013 revealed evidence of inferior scar without ischemia, LVEF 48%.  He is here with an Geophysicist/field seismologist today Resides in [facility]. He has pending lab work including BMET and BNP, chronic edema at baseline. Medications were reviewed. Weight is down 4 pounds compared to November.   Previous lab work from February showed cholesterol 118, triglycerides 125, HDL 34, LDL 59.   Allergies  Allergen Reactions  . Morphine And Related Other (See Comments)    Per [facility]    Current Outpatient Prescriptions  Medication Sig Dispense Refill  . ALPRAZolam (XANAX) 1 MG tablet Take 1 mg by mouth 3 (three) times daily. scheduled      . amLODipine (NORVASC) 10 MG tablet Take 10 mg by mouth daily.        . carbamazepine (CARBATROL) 200 MG 12 hr capsule Take 200 mg by mouth 5 (five) times daily. 2400; 0500; 1000; 1400; 1900      . citalopram (CELEXA) 10 MG tablet Take 5 mg by mouth daily.       . divalproex (DEPAKOTE) 500 MG EC tablet Take 1,000 mg by mouth 3 (three) times daily.        . folic acid (FOLVITE) 1 MG tablet Take 1 mg by mouth every morning.      . furosemide (LASIX) 40 MG tablet Take 1 tablet (40 mg total) by mouth daily.      Marland Kitchen glipiZIDE (GLUCOTROL) 5 MG tablet Take 5 mg by mouth daily before breakfast.       . isosorbide mononitrate (IMDUR) 60 MG 24 hr tablet Take 180 mg by mouth daily.      Marland Kitchen levothyroxine (SYNTHROID, LEVOTHROID) 200 MCG tablet Take 200 mcg by mouth daily.       Marland Kitchen levothyroxine (SYNTHROID, LEVOTHROID) 50 MCG tablet Take 50 mcg by mouth once a week. Sundays only along with tablet to equal total dose of on Sundays      . loratadine (CLARITIN) 10 MG tablet  Take 10 mg by mouth as needed for allergies.      . metoprolol (LOPRESSOR) 50 MG tablet Take 1 tablet (50 mg total) by mouth 2 (two) times daily.  60 tablet  6  . nitroGLYCERIN (NITROSTAT) 0.4 MG SL tablet Place 1 tablet (0.4 mg total) under the tongue every 5 (five) minutes as needed for chest pain (up to 3 doses).      Marland Kitchen omeprazole (PRILOSEC) 20 MG capsule Take 40 mg by mouth daily.      Marland Kitchen oxyCODONE (OXY IR/ROXICODONE) 5 MG immediate release tablet Take 10 mg by mouth every 6 (six) hours. Scheduled; 0600; 1200; 1800; 2400      . rosuvastatin (CRESTOR) 20 MG tablet Take 20 mg by mouth daily.      Marland Kitchen warfarin (COUMADIN) 6 MG tablet Take 6 mg by mouth every evening.      . zolpidem (AMBIEN) 5 MG tablet Take 5 mg by mouth at bedtime as needed. For insomnia       No current facility-administered medications for this visit.    Past Medical History  Diagnosis Date  . PVD (peripheral vascular disease)     Severe lower extremitiy  . Type 2 diabetes  mellitus   . Mixed hyperlipidemia   . Obesity   . Seizure disorder   . OSA (obstructive sleep apnea)   . CVA (cerebrovascular accident) 2004    Persistent residual expressive aphasia  . Coronary atherosclerosis of native coronary artery     Multivessel  . Chest discomfort     Chronic on narcotics - Dr. Sherril Croon  . Cervical spine disease   . Iron deficiency anemia     Managed by Dr. Myna Hidalgo  . PAF (paroxysmal atrial fibrillation)   . Carotid artery disease   . GERD (gastroesophageal reflux disease)   . Myocardial infarction   . Colon cancer 2007    Past Surgical History  Procedure Laterality Date  . Carotid endarterectomy      Left  . Partial colectomy    . Coronary artery bypass graft  05/2006    LIMA to diagonal, RIMA to LAD, radial to OM, radial to PDA  . Left shoulder surgery    . Cholecystectomy    . Femoral artery - popliteal artery bypass graft      Bilateral  . Tonsillectomy and adenoidectomy      Social History Mr. Derner  reports that he quit smoking about 21 years ago. His smoking use included Cigarettes. He has a 25 pack-year smoking history. He has never used smokeless tobacco. Mr. Testa reports that he does not drink alcohol.  Review of Systems No obvious chest pain or palpitations, history is limited by expressive aphasia.  Physical Examination Filed Vitals:   06/30/12 1348  BP: 134/75  Pulse: 59   Filed Weights   06/30/12 1348  Weight: 235 lb (106.595 kg)    No acute distress.  HEENT: Conjunctiva and lids normal, oropharynx is moist it disappeared  Neck: Supple, no elevated JVP, left-sided scar from endarterectomy, soft bruits.  Lungs: Diminished breath sounds, nonlabored.  Cardiac: Regular rate and rhythm, no S3 gallop or rub.  Thorax: Well-healed midline sternal incision.  Abdomen: Soft, nontender, bowel sounds present.  Skin: Warm and dry.  Extremities: 1-2+ edema below the knees, symmetrical, distal pulses diminished. Scars from prior bilateral radial artery harvest noted.  Neuropsychiatric: Alert and oriented x3, expressive aphasia.   Problem List and Plan   Coronary atherosclerosis of native coronary artery Continue medical therapy at this point. Myoview study from September of 2013 revealed inferior scar without ischemia, LVEF 48% at that point.  Edema Relatively clonic leg edema. He is status post bilateral vein harvesting. Does continue on a diuretic, and his overall weight is down 4 pounds. Review BMET and BNP when available.  Hyperlipidemia Continues on Crestor, LDL 59 in February.    Jonelle Sidle, M.D., F.A.C.C.

## 2012-06-30 NOTE — Assessment & Plan Note (Signed)
Relatively clonic leg edema. He is status post bilateral vein harvesting. Does continue on a diuretic, and his overall weight is down 4 pounds. Review BMET and BNP when available.

## 2012-06-30 NOTE — Patient Instructions (Addendum)
Continue all current medications. Your physician wants you to follow up in: 6 months.  You will receive a reminder letter in the mail one-two months in advance.  If you don't receive a letter, please call our office to schedule the follow up appointment   

## 2012-07-04 ENCOUNTER — Telehealth: Payer: Self-pay | Admitting: Physician Assistant

## 2012-07-04 NOTE — Telephone Encounter (Signed)
Received a telephone call from [place] [facility]. Requested to speak with Gene Serpe, PA C [phone]

## 2012-07-06 NOTE — Telephone Encounter (Addendum)
Had questions about PVD.  Checking to see when last studies had been done.  States he has been c/o leg pain (claudication).  Informed her that he had lower extremity arteriography 2011 and last seen Dr. Hart Rochester for this 11/2009.  Wanted to let us know that she was going to refer him back to Dr. Hart Rochester for his leg pain.

## 2012-07-14 ENCOUNTER — Telehealth: Payer: Self-pay | Admitting: Vascular Surgery

## 2012-07-14 ENCOUNTER — Telehealth: Payer: Self-pay

## 2012-07-14 DIAGNOSIS — R200 Anesthesia of skin: Secondary | ICD-10-CM

## 2012-07-14 DIAGNOSIS — I70229 Atherosclerosis of native arteries of extremities with rest pain, unspecified extremity: Secondary | ICD-10-CM

## 2012-07-14 NOTE — Telephone Encounter (Signed)
Nurse called from [facility] to request appt. ASAP for pt.  Reports that pt. C/o bilat lower extremity rest pain and numbness in right leg.  Denies any open sores.  States recent doppler study showed "severly diminshed to nearly absent right pedal pulse".  States pt. Has dementia.  Discussed with Dr. Imogene Burn.  Rec'd verbal order for ABI's and Right Lower Extrem. Art. Duplex.  Will schedule pt. for vascular studies and MD appt.

## 2012-07-14 NOTE — Telephone Encounter (Signed)
Please schedule for abi's and right LE arterial duplex and md visit with dr. Hart Rochester 07-18-12, if possible, request of [facility] Nurse Practitioner/ c/o rest pain bilateral LE numbness (R) LE doppler shows near absent (R) pedal pulse/ call Judeth Cornfield at [facility] [phone]  Notified Fannie Knee at [facility] of appt. for [name] on 07-18-12 at [facility]

## 2012-07-17 ENCOUNTER — Encounter: Payer: Self-pay | Admitting: Vascular Surgery

## 2012-07-18 ENCOUNTER — Encounter (INDEPENDENT_AMBULATORY_CARE_PROVIDER_SITE_OTHER): Payer: Medicare Other | Admitting: *Deleted

## 2012-07-18 ENCOUNTER — Encounter: Payer: Self-pay | Admitting: Vascular Surgery

## 2012-07-18 ENCOUNTER — Ambulatory Visit (INDEPENDENT_AMBULATORY_CARE_PROVIDER_SITE_OTHER): Payer: Medicare Other | Admitting: Vascular Surgery

## 2012-07-18 VITALS — BP 132/72 | HR 70 | Ht 71.0 in | Wt 230.9 lb

## 2012-07-18 DIAGNOSIS — Z48812 Encounter for surgical aftercare following surgery on the circulatory system: Secondary | ICD-10-CM

## 2012-07-18 DIAGNOSIS — I70229 Atherosclerosis of native arteries of extremities with rest pain, unspecified extremity: Secondary | ICD-10-CM | POA: Insufficient documentation

## 2012-07-18 DIAGNOSIS — R200 Anesthesia of skin: Secondary | ICD-10-CM

## 2012-07-18 DIAGNOSIS — I739 Peripheral vascular disease, unspecified: Secondary | ICD-10-CM

## 2012-07-18 DIAGNOSIS — R209 Unspecified disturbances of skin sensation: Secondary | ICD-10-CM

## 2012-07-18 NOTE — Progress Notes (Signed)
Subjective:     Patient ID: [name], male   DOB: Apr 07, 1946, 67 y.o.   MRN: [id number]  HPI this 67 year old male returns for continued followup regarding his left femoral to popliteal to posterior tibial sequential graft and a redo right femoral-tibial peroneal trunk Gore-Tex graft. He has numbness in the right lower extremity. He does ambulate. He has no history of rest pain or nonhealing ulcerations. He has had a previous stroke.   Past Medical History  Diagnosis Date  . PVD (peripheral vascular disease)     Severe lower extremitiy  . Type 2 diabetes mellitus   . Mixed hyperlipidemia   . Obesity   . Seizure disorder   . OSA (obstructive sleep apnea)   . CVA (cerebrovascular accident) 2004    Persistent residual expressive aphasia  . Coronary atherosclerosis of native coronary artery     Multivessel  . Chest discomfort     Chronic on narcotics - Dr. Sherril Croon  . Cervical spine disease   . Iron deficiency anemia     Managed by Dr. Myna Hidalgo  . PAF (paroxysmal atrial fibrillation)   . Carotid artery disease   . GERD (gastroesophageal reflux disease)   . Myocardial infarction   . Colon cancer 2007    History  Substance Use Topics  . Smoking status: Former Smoker -- 1.00 packs/day for 25 years    Types: Cigarettes    Quit date: 10/14/1990  . Smokeless tobacco: Never Used  . Alcohol Use: No    Family History  Problem Relation Age of Onset  . Diabetes Mother     Allergies  Allergen Reactions  . Morphine And Related Other (See Comments)    Per MAR    Current outpatient prescriptions:ALPRAZolam (XANAX) 1 MG tablet, Take 1 mg by mouth 3 (three) times daily. scheduled, Disp: , Rfl: ;  amLODipine (NORVASC) 10 MG tablet, Take 10 mg by mouth daily.  , Disp: , Rfl: ;  carbamazepine (CARBATROL) 200 MG 12 hr capsule, Take 200 mg by mouth 5 (five) times daily. 2400; 0500; 1000; 1400; 1900, Disp: , Rfl: ;  citalopram (CELEXA) 10 MG tablet, Take 5 mg by mouth daily. , Disp: , Rfl:   divalproex (DEPAKOTE) 500 MG EC tablet, Take 1,000 mg by mouth 3 (three) times daily.  , Disp: , Rfl: ;  folic acid (FOLVITE) 1 MG tablet, Take 1 mg by mouth every morning., Disp: , Rfl: ;  furosemide (LASIX) 40 MG tablet, Take 1 tablet (40 mg total) by mouth daily., Disp: , Rfl: ;  glipiZIDE (GLUCOTROL) 5 MG tablet, Take 5 mg by mouth daily before breakfast. , Disp: , Rfl:  isosorbide mononitrate (IMDUR) 60 MG 24 hr tablet, Take 180 mg by mouth daily., Disp: , Rfl: ;  levothyroxine (SYNTHROID, LEVOTHROID) 200 MCG tablet, Take 200 mcg by mouth daily. , Disp: , Rfl: ;  levothyroxine (SYNTHROID, LEVOTHROID) 50 MCG tablet, Take 50 mcg by mouth once a week. Sundays only along with tablet to equal total dose of on Sundays, Disp: , Rfl:  loratadine (CLARITIN) 10 MG tablet, Take 10 mg by mouth as needed for allergies., Disp: , Rfl: ;  metoprolol (LOPRESSOR) 50 MG tablet, Take 1 tablet (50 mg total) by mouth 2 (two) times daily., Disp: 60 tablet, Rfl: 6;  nitroGLYCERIN (NITROSTAT) 0.4 MG SL tablet, Place 1 tablet (0.4 mg total) under the tongue every 5 (five) minutes as needed for chest pain (up to 3 doses)., Disp: , Rfl:  omeprazole (  PRILOSEC) 20 MG capsule, Take 40 mg by mouth daily., Disp: , Rfl: ;  oxyCODONE (OXY IR/ROXICODONE) 5 MG immediate release tablet, Take 10 mg by mouth every 6 (six) hours. Scheduled; 0600; 1200; 1800; 2400, Disp: , Rfl: ;  rosuvastatin (CRESTOR) 20 MG tablet, Take 20 mg by mouth daily., Disp: , Rfl: ;  warfarin (COUMADIN) 6 MG tablet, Take 6 mg by mouth every evening., Disp: , Rfl:  zolpidem (AMBIEN) 5 MG tablet, Take 5 mg by mouth at bedtime as needed. For insomnia, Disp: , Rfl:   BP 132/72  Pulse 70  Ht 5\' 11"  (1.803 m)  Wt 230 lb 14.4 oz (104.736 kg)  BMI 32.22 kg/m2  SpO2 100%  Body mass index is 32.22 kg/(m^2).          Review of Systems complains of multiple findings including numbness right lower extremity, difficulty with ambulation, visual chest  discomfort, difficulty with speech,    Objective:   Physical Exam blood pressure 130/72 heart rate 70 respirations 16 Gen.-alert and oriented x3 in no apparent distress HEENT normal for age-expressive aphasia  Lungs no rhonchi or wheezing Cardiovascular regular rhythm no murmurs carotid pulses 3+ palpable no bruits audible Abdomen soft nontender no palpable masses Musculoskeletal free of  major deformities Skin clear -no rashes Neurologic normal Lower extremities 3+ femoral and dorsalis pedis pulses palpable bilaterally with 1+ edema bilaterally-no active ulcerations or infection or gangrene  Today I ordered lower extremity arterial Dopplers which are reviewed and interpreted. ABIs are excellent bilaterally 1.03 on the right and 0.95 on the left with triphasic flow bilaterally     Assessment:     Bilateral functioning lower extremity bypass grafts-redo on both sides with normal ABIs Chronic numbness right lower extremity probably secondary to previous left brain stroke Chronic bilateral edema    Plan:     We will follow on annual basis for patency of bypass grafts-numbness not related to arterial circulation

## 2012-07-19 ENCOUNTER — Encounter: Payer: Self-pay | Admitting: Internal Medicine

## 2012-07-19 NOTE — Addendum Note (Signed)
Addended by: Adria Dill L on: 07/19/2012 09:33 AM   Modules accepted: Orders

## 2012-08-17 ENCOUNTER — Encounter: Payer: Self-pay | Admitting: Cardiology

## 2012-08-17 ENCOUNTER — Other Ambulatory Visit: Payer: Self-pay | Admitting: *Deleted

## 2012-08-17 MED ORDER — OXYCODONE HCL 10 MG PO TABS: ORAL_TABLET | ORAL | Status: DC

## 2012-08-17 NOTE — Telephone Encounter (Signed)
error 

## 2012-09-12 ENCOUNTER — Emergency Department (HOSPITAL_COMMUNITY): Payer: PRIVATE HEALTH INSURANCE

## 2012-09-12 ENCOUNTER — Emergency Department (HOSPITAL_COMMUNITY)
Admission: EM | Admit: 2012-09-12 | Discharge: 2012-09-12 | Disposition: A | Discharge status: Home / Self Care | Payer: PRIVATE HEALTH INSURANCE | Attending: Emergency Medicine | Admitting: Emergency Medicine

## 2012-09-12 ENCOUNTER — Encounter (HOSPITAL_COMMUNITY): Payer: Self-pay | Admitting: *Deleted

## 2012-09-12 DIAGNOSIS — E785 Hyperlipidemia, unspecified: Secondary | ICD-10-CM | POA: Insufficient documentation

## 2012-09-12 DIAGNOSIS — R296 Repeated falls: Secondary | ICD-10-CM | POA: Insufficient documentation

## 2012-09-12 DIAGNOSIS — I2510 Atherosclerotic heart disease of native coronary artery without angina pectoris: Secondary | ICD-10-CM | POA: Insufficient documentation

## 2012-09-12 DIAGNOSIS — G40909 Epilepsy, unspecified, not intractable, without status epilepticus: Secondary | ICD-10-CM | POA: Insufficient documentation

## 2012-09-12 DIAGNOSIS — Z85038 Personal history of other malignant neoplasm of large intestine: Secondary | ICD-10-CM | POA: Insufficient documentation

## 2012-09-12 DIAGNOSIS — Y939 Activity, unspecified: Secondary | ICD-10-CM | POA: Insufficient documentation

## 2012-09-12 DIAGNOSIS — Z79899 Other long term (current) drug therapy: Secondary | ICD-10-CM | POA: Insufficient documentation

## 2012-09-12 DIAGNOSIS — S01111A Laceration without foreign body of right eyelid and periocular area, initial encounter: Secondary | ICD-10-CM

## 2012-09-12 DIAGNOSIS — W19XXXA Unspecified fall, initial encounter: Secondary | ICD-10-CM

## 2012-09-12 DIAGNOSIS — Z8739 Personal history of other diseases of the musculoskeletal system and connective tissue: Secondary | ICD-10-CM | POA: Insufficient documentation

## 2012-09-12 DIAGNOSIS — Z7901 Long term (current) use of anticoagulants: Secondary | ICD-10-CM | POA: Insufficient documentation

## 2012-09-12 DIAGNOSIS — D509 Iron deficiency anemia, unspecified: Secondary | ICD-10-CM | POA: Insufficient documentation

## 2012-09-12 DIAGNOSIS — Y929 Unspecified place or not applicable: Secondary | ICD-10-CM | POA: Insufficient documentation

## 2012-09-12 DIAGNOSIS — K219 Gastro-esophageal reflux disease without esophagitis: Secondary | ICD-10-CM | POA: Insufficient documentation

## 2012-09-12 DIAGNOSIS — I739 Peripheral vascular disease, unspecified: Secondary | ICD-10-CM | POA: Insufficient documentation

## 2012-09-12 DIAGNOSIS — E669 Obesity, unspecified: Secondary | ICD-10-CM | POA: Insufficient documentation

## 2012-09-12 DIAGNOSIS — Z87891 Personal history of nicotine dependence: Secondary | ICD-10-CM | POA: Insufficient documentation

## 2012-09-12 DIAGNOSIS — G4733 Obstructive sleep apnea (adult) (pediatric): Secondary | ICD-10-CM | POA: Insufficient documentation

## 2012-09-12 DIAGNOSIS — E119 Type 2 diabetes mellitus without complications: Secondary | ICD-10-CM | POA: Insufficient documentation

## 2012-09-12 DIAGNOSIS — I252 Old myocardial infarction: Secondary | ICD-10-CM | POA: Insufficient documentation

## 2012-09-12 DIAGNOSIS — S0180XA Unspecified open wound of other part of head, initial encounter: Secondary | ICD-10-CM | POA: Insufficient documentation

## 2012-09-12 DIAGNOSIS — S0990XA Unspecified injury of head, initial encounter: Secondary | ICD-10-CM | POA: Insufficient documentation

## 2012-09-12 DIAGNOSIS — Z9089 Acquired absence of other organs: Secondary | ICD-10-CM | POA: Insufficient documentation

## 2012-09-12 DIAGNOSIS — Z951 Presence of aortocoronary bypass graft: Secondary | ICD-10-CM | POA: Insufficient documentation

## 2012-09-12 LAB — BASIC METABOLIC PANEL
GFR calc Af Amer: >90 mL/min (ref >90)
GFR calc non Af Amer: 85 mL/min — ABNORMAL LOW (ref >90)
Potassium: 5 mEq/L (ref 3.5–5.1)
Sodium: 132 mEq/L — ABNORMAL LOW (ref 135–145)

## 2012-09-12 LAB — CBC WITH DIFFERENTIAL/PLATELET
Basophils Absolute: 0 10*3/uL (ref 0.0–0.1)
Basophils Relative: 0 % (ref 0–1)
Eosinophils Absolute: 0.2 10*3/uL (ref 0.0–0.7)
MCH: 30.8 pg (ref 26.0–34.0)
MCHC: 34.3 g/dL (ref 30.0–36.0)
Neutrophils Relative %: 51 % (ref 43–77)
Platelets: 334 10*3/uL (ref 150–400)
RBC: 4 MIL/uL — ABNORMAL LOW (ref 4.22–5.81)

## 2012-09-12 LAB — URINALYSIS, ROUTINE W REFLEX MICROSCOPIC
Bilirubin Urine: NEGATIVE
Glucose, UA: NEGATIVE mg/dL
Hgb urine dipstick: NEGATIVE
Protein, ur: NEGATIVE mg/dL
Urobilinogen, UA: 0.2 mg/dL (ref 0.0–1.0)

## 2012-09-12 LAB — PROTIME-INR: INR: 2.73 — ABNORMAL HIGH (ref 0.00–1.49)

## 2012-09-12 LAB — APTT: aPTT: 58 seconds — ABNORMAL HIGH (ref 24–37)

## 2012-09-12 NOTE — ED Notes (Signed)
Pt's sister Aurther Loft called to inquire about pt's status. Informed her we did not have all the results back, but that he was stable. Will call back in a hour to find out results.

## 2012-09-12 NOTE — ED Notes (Signed)
Per EMS pt from [facility] with c/o fall due to not using walker. Pt has lac to above right eye. Stroke Scale Negative. Pt has had decrease in mental status. GCS 11 per EMS. Pt on coumadin. Pt given oxicontin post fall. VSS. BP 188/96, HR 158 A-Fib.

## 2012-09-12 NOTE — ED Notes (Addendum)
Spoke to Charity fundraiser at [employer]. Pt is alert per norm if able to answer who his sister is. Pt able to answer that sister is Barth Kirks, verified by Charity fundraiser at [employer]. EDP aware.

## 2012-09-12 NOTE — ED Provider Notes (Signed)
History     CSN: [id number]  Arrival date & time 09/12/12  1728   First MD Initiated Contact with Patient 09/12/12 1729      Chief Complaint  Patient presents with  . Altered Mental Status  . Fall    (Consider location/radiation/quality/duration/timing/severity/associated sxs/prior treatment) Patient is a 67 y.o. male presenting with altered mental status and fall.  Altered Mental Status Fall   Level 5 caveat due to altered mental status Pt with history of multiple medical problems including chronic pain and afib on coumadin was brought to the ED via EMS after a fall at ALF. He apparently struck his head. By report he was confused initially but then awake for a period of time. During that time he was reportedly given his regular dose of Oxycodone and afterwards became confused and less responsive. EMS reports he also had an episode of tachycardia enroute, but that resolved prior to arrival.    Past Medical History  Diagnosis Date  . PVD (peripheral vascular disease)     Severe lower extremitiy  . Type 2 diabetes mellitus   . Mixed hyperlipidemia   . Obesity   . Seizure disorder   . OSA (obstructive sleep apnea)   . CVA (cerebrovascular accident) 2004    Persistent residual expressive aphasia  . Coronary atherosclerosis of native coronary artery     Multivessel  . Chest discomfort     Chronic on narcotics - Dr. Sherril Croon  . Cervical spine disease   . Iron deficiency anemia     Managed by Dr. Myna Hidalgo  . PAF (paroxysmal atrial fibrillation)   . Carotid artery disease   . GERD (gastroesophageal reflux disease)   . Myocardial infarction   . Colon cancer 2007    Past Surgical History  Procedure Laterality Date  . Carotid endarterectomy      Left  . Partial colectomy    . Coronary artery bypass graft  05/2006    LIMA to diagonal, RIMA to LAD, radial to OM, radial to PDA  . Left shoulder surgery    . Cholecystectomy    . Femoral artery - popliteal artery bypass graft       Bilateral  . Tonsillectomy and adenoidectomy      Family History  Problem Relation Age of Onset  . Diabetes Mother     History  Substance Use Topics  . Smoking status: Former Smoker -- 1.00 packs/day for 25 years    Types: Cigarettes    Quit date: 10/14/1990  . Smokeless tobacco: Never Used  . Alcohol Use: No      Review of Systems  Psychiatric/Behavioral: Positive for altered mental status.   Unable to assess due to mental status.   Allergies  Morphine and related  Home Medications   Current Outpatient Rx  Name  Route  Sig  Dispense  Refill  . ALPRAZolam (XANAX) 1 MG tablet   Oral   Take 1 mg by mouth 3 (three) times daily. scheduled         . amLODipine (NORVASC) 10 MG tablet   Oral   Take 10 mg by mouth daily.           . carbamazepine (CARBATROL) 200 MG 12 hr capsule   Oral   Take 200 mg by mouth 5 (five) times daily. 2400; 0500; 1000; 1400; 1900         . cetirizine (ZYRTEC) 10 MG tablet   Oral   Take 10 mg by mouth daily as needed  for allergies.         Marland Kitchen divalproex (DEPAKOTE) 500 MG EC tablet   Oral   Take 1,000 mg by mouth 3 (three) times daily.           . folic acid (FOLVITE) 1 MG tablet   Oral   Take 1 mg by mouth every morning.         . furosemide (LASIX) 40 MG tablet   Oral   Take 1 tablet (40 mg total) by mouth daily.           Dose increased 01/21/2012.   . gabapentin (NEURONTIN) 300 MG capsule   Oral   Take 300 mg by mouth 2 (two) times daily.         Marland Kitchen glipiZIDE (GLUCOTROL) 5 MG tablet   Oral   Take 5 mg by mouth 2 (two) times daily before a meal.          . isosorbide mononitrate (IMDUR) 60 MG 24 hr tablet   Oral   Take 180 mg by mouth daily.         Marland Kitchen levothyroxine (SYNTHROID, LEVOTHROID) 200 MCG tablet   Oral   Take 200 mcg by mouth daily.          Marland Kitchen levothyroxine (SYNTHROID, LEVOTHROID) 50 MCG tablet   Oral   Take 50 mcg by mouth once a week. Sundays only along with tablet to equal total  dose of on Sundays         . metoprolol (LOPRESSOR) 50 MG tablet   Oral   Take 1 tablet (50 mg total) by mouth 2 (two) times daily.   60 tablet   6   . nitroGLYCERIN (NITROSTAT) 0.4 MG SL tablet   Sublingual   Place 1 tablet (0.4 mg total) under the tongue every 5 (five) minutes as needed for chest pain (up to 3 doses).           Patient resides at [facility] they will suppl ...   . omeprazole (PRILOSEC) 20 MG capsule   Oral   Take 40 mg by mouth daily.         . Oxycodone HCl 10 MG TABS   Oral   Take 10 mg by mouth every 6 (six) hours as needed (for pain).         . Propylene Glycol (SYSTANE BALANCE) 0.6 % SOLN   Both Eyes   Place 1 drop into both eyes 3 (three) times daily.         . rosuvastatin (CRESTOR) 20 MG tablet   Oral   Take 20 mg by mouth daily.         Marland Kitchen senna-docusate (SENOKOT-S) 8.6-50 MG per tablet   Oral   Take 1 tablet by mouth 2 (two) times daily.         . sertraline (ZOLOFT) 50 MG tablet   Oral   Take 75 mg by mouth daily.         Marland Kitchen warfarin (COUMADIN) 2.5 MG tablet   Oral   Take 2.5 mg by mouth daily. Takes at same time as 3mg  tablet for a 5.5mg  dose         . warfarin (COUMADIN) 3 MG tablet   Oral   Take 3 mg by mouth daily. Takes at same time with 2.5mg  tablet for a 5.5mg  dose         . zolpidem (AMBIEN) 5 MG tablet   Oral  Take 2.5 mg by mouth at bedtime as needed for sleep. For insomnia         . cefdinir (OMNICEF) 300 MG capsule   Oral   Take 300 mg by mouth 2 (two) times daily.         Marland Kitchen sulfamethoxazole-trimethoprim (BACTRIM DS) 800-160 MG per tablet   Oral   Take 1 tablet by mouth 2 (two) times daily.           BP 167/71  Pulse 67  Temp(Src) 98.7 F (37.1 C) (Oral)  Resp 18  SpO2 98%  Physical Exam  Nursing note and vitals reviewed. Constitutional: He appears well-developed and well-nourished.  HENT:  Head: Normocephalic.  Laceration R eyebrow  Eyes: EOM are normal. Pupils are  equal, round, and reactive to light.  Neck:  In C-collar  Cardiovascular: Normal rate, normal heart sounds and intact distal pulses.   Pulmonary/Chest: Effort normal and breath sounds normal.  Abdominal: Bowel sounds are normal. He exhibits no distension. There is no tenderness.  Musculoskeletal: Normal range of motion. He exhibits no edema and no tenderness.  Neurological: He has normal strength and normal reflexes. No cranial nerve deficit. GCS eye subscore is 4. GCS verbal subscore is 3. GCS motor subscore is 6.  Skin: Skin is warm and dry. No rash noted.  Psychiatric: He has a normal mood and affect.    ED Course  Procedures (including critical care time)  Labs Reviewed  CBC WITH DIFFERENTIAL - Abnormal; Notable for the following:    WBC 3.9 (*)    RBC 4.00 (*)    Hemoglobin 12.3 (*)    HCT 35.9 (*)    Monocytes Relative 15 (*)    All other components within normal limits  BASIC METABOLIC PANEL - Abnormal; Notable for the following:    Sodium 132 (*)    BUN 51 (*)    GFR calc non Af Amer 85 (*)    All other components within normal limits  PROTIME-INR - Abnormal; Notable for the following:    Prothrombin Time 27.6 (*)    INR 2.73 (*)    All other components within normal limits  APTT - Abnormal; Notable for the following:    aPTT 58 (*)    All other components within normal limits  URINALYSIS, ROUTINE W REFLEX MICROSCOPIC  TYPE AND SCREEN   Ct Head Wo Contrast  09/12/2012   *RADIOLOGY REPORT*  Clinical Data:  History of fall with injury to the head.  On Coumadin.  Altered mental status.  CT HEAD WITHOUT CONTRAST CT CERVICAL SPINE WITHOUT CONTRAST  Technique:  Multidetector CT imaging of the head and cervical spine was performed following the standard protocol without intravenous contrast.  Multiplanar CT image reconstructions of the cervical spine were also generated.  Comparison:  Head CT 09/06/2012.  CT HEAD  Findings: Area of encephalomalacia in the left parietal region  related to prior infarction again noted. Mild cerebral atrophy is unchanged.  No acute displaced skull fracture or acute intracranial abnormalities.  Specifically, no signs of acute post-traumatic intracranial hemorrhage.  No definite evidence of acute/subacute cerebral ischemia, no mass, mass effect, hydrocephalus or other abnormal intra or extra-axial fluid collections.  Visualized paranasal sinuses and mastoids are well pneumatized.  IMPRESSION: 1.  No acute displaced skull fractures or findings to suggest significant acute traumatic injury to the brain. 2.  Encephalomalacia in the left parietal region related to prior infarction and mild cerebral atrophy, unchanged.  CT CERVICAL SPINE  Findings: No acute displaced cervical spine fractures are noted. There is multilevel degenerative disc disease, most severe C5-C6. Multilevel facet arthropathy is also noted.  This results in some mild straightening of the normal cervical lordosis, particularly from C2-C5.  Alignment is otherwise anatomic.  Prevertebral soft tissues are normal.  Visualized portions of the upper thorax are unremarkable.  Extensive atherosclerosis throughout the carotid and vertebrobasilar system.  IMPRESSION: 1.  No evidence of significant acute traumatic injury to the cervical spine. 2.  Multilevel degenerative disc disease and cervical spondylosis, as above. 3.  Severe atherosclerosis.   Original Report Authenticated By: Trudie Reed, M.D.   Ct Cervical Spine Wo Contrast  09/12/2012   *RADIOLOGY REPORT*  Clinical Data:  History of fall with injury to the head.  On Coumadin.  Altered mental status.  CT HEAD WITHOUT CONTRAST CT CERVICAL SPINE WITHOUT CONTRAST  Technique:  Multidetector CT imaging of the head and cervical spine was performed following the standard protocol without intravenous contrast.  Multiplanar CT image reconstructions of the cervical spine were also generated.  Comparison:  Head CT 09/06/2012.  CT HEAD  Findings: Area of  encephalomalacia in the left parietal region related to prior infarction again noted. Mild cerebral atrophy is unchanged.  No acute displaced skull fracture or acute intracranial abnormalities.  Specifically, no signs of acute post-traumatic intracranial hemorrhage.  No definite evidence of acute/subacute cerebral ischemia, no mass, mass effect, hydrocephalus or other abnormal intra or extra-axial fluid collections.  Visualized paranasal sinuses and mastoids are well pneumatized.  IMPRESSION: 1.  No acute displaced skull fractures or findings to suggest significant acute traumatic injury to the brain. 2.  Encephalomalacia in the left parietal region related to prior infarction and mild cerebral atrophy, unchanged.  CT CERVICAL SPINE  Findings: No acute displaced cervical spine fractures are noted. There is multilevel degenerative disc disease, most severe C5-C6. Multilevel facet arthropathy is also noted.  This results in some mild straightening of the normal cervical lordosis, particularly from C2-C5.  Alignment is otherwise anatomic.  Prevertebral soft tissues are normal.  Visualized portions of the upper thorax are unremarkable.  Extensive atherosclerosis throughout the carotid and vertebrobasilar system.  IMPRESSION: 1.  No evidence of significant acute traumatic injury to the cervical spine. 2.  Multilevel degenerative disc disease and cervical spondylosis, as above. 3.  Severe atherosclerosis.   Original Report Authenticated By: Trudie Reed, M.D.     1. Fall, initial encounter   2. Eyebrow laceration, right, initial encounter   3. Head injury, initial encounter       MDM   Date: 09/12/2012  Rate: 55  Rhythm: sinus bradycardia  QRS Axis: normal  Intervals: normal  ST/T Wave abnormalities: nonspecific ST/T changes  Conduction Disutrbances:nonspecific intraventricular conduction delay  Narrative Interpretation:   Old EKG Reviewed: unchanged  10:17 PM CT neg for bleeding as above. R  eyebrow laceration had been cleaned and repaired with steri-strips by staff at ALF prior to arrival. He is now alert, answering questions and back to baseline. No clear reason for his AMS except for possibly mild concussion or due to oxycodone. He is ready to return to facility.         Lilla Callejo B. Bernette Mayers, MD 09/12/12 2221

## 2012-09-12 NOTE — ED Notes (Signed)
Pt sleeping, easily arousable

## 2012-09-12 NOTE — ED Notes (Signed)
Patient transported to CT 

## 2012-09-12 NOTE — ED Notes (Signed)
[facility] called and report given.

## 2012-12-11 ENCOUNTER — Encounter: Payer: Self-pay | Admitting: Vascular Surgery

## 2012-12-12 ENCOUNTER — Encounter (INDEPENDENT_AMBULATORY_CARE_PROVIDER_SITE_OTHER): Payer: PRIVATE HEALTH INSURANCE | Admitting: Vascular Surgery

## 2012-12-12 ENCOUNTER — Encounter: Payer: Self-pay | Admitting: Vascular Surgery

## 2012-12-12 ENCOUNTER — Ambulatory Visit (INDEPENDENT_AMBULATORY_CARE_PROVIDER_SITE_OTHER): Payer: PRIVATE HEALTH INSURANCE | Admitting: Vascular Surgery

## 2012-12-12 VITALS — BP 168/72 | HR 72 | Temp 98.6°F | Resp 14 | Ht 71.0 in | Wt 233.0 lb

## 2012-12-12 DIAGNOSIS — I70229 Atherosclerosis of native arteries of extremities with rest pain, unspecified extremity: Secondary | ICD-10-CM

## 2012-12-12 DIAGNOSIS — M79609 Pain in unspecified limb: Secondary | ICD-10-CM

## 2012-12-12 NOTE — Progress Notes (Signed)
Subjective:     Patient ID: [name], male   DOB: 13-Nov-1945, 67 y.o.   MRN: [id number]  HPI this 67 year old male patient who currently resides in a nursing facility is evaluated today for rest pain in the right leg for the past 3 months. Patient has a long history of severe diffuse vascular occlusive disease and has had bilateral lower extremity bypass grafts on many occasions. He was last seen in April of 2014 in our office with both grafts being patent the right being a redo femoral to tibioperoneal trunk graft. I performed that procedure in 2011 and he was found to have severe calcific disease in the right external iliac and common femoral artery with a very difficult inflow site being identified with diffuse severe disease. He also had severe disease in the tibioperoneal trunk which was the distal anastomosis. He was found to have diffusely calcified distal vessels. The graft didn't remain patent until probably 3 months ago when the patient began experiencing more pain in the right leg. He has chronic numbness in the right leg possibly due to diabetic neuropathy and a previous left brain stroke as well as his ischemic neuropathy. He does not ambulate much. He also has a history of coronary artery disease as well as the stroke.  Past Medical History  Diagnosis Date  . PVD (peripheral vascular disease)     Severe lower extremitiy  . Type 2 diabetes mellitus   . Mixed hyperlipidemia   . Obesity   . Seizure disorder   . OSA (obstructive sleep apnea)   . CVA (cerebrovascular accident) 2004    Persistent residual expressive aphasia  . Coronary atherosclerosis of native coronary artery     Multivessel  . Chest discomfort     Chronic on narcotics - Dr. Sherril Croon  . Cervical spine disease   . Iron deficiency anemia     Managed by Dr. Myna Hidalgo  . PAF (paroxysmal atrial fibrillation)   . Carotid artery disease   . GERD (gastroesophageal reflux disease)   . Myocardial infarction   . Colon cancer  2007    History  Substance Use Topics  . Smoking status: Former Smoker -- 1.00 packs/day for 25 years    Types: Cigarettes    Quit date: 10/14/1990  . Smokeless tobacco: Never Used  . Alcohol Use: No    Family History  Problem Relation Age of Onset  . Diabetes Mother     Allergies  Allergen Reactions  . Morphine And Related Other (See Comments)    Per [facility]    Current outpatient prescriptions:ALPRAZolam (XANAX) 1 MG tablet, Take 1 mg by mouth 3 (three) times daily. scheduled, Disp: , Rfl: ;  amLODipine (NORVASC) 10 MG tablet, Take 10 mg by mouth daily.  , Disp: , Rfl: ;  carbamazepine (CARBATROL) 200 MG 12 hr capsule, Take 200 mg by mouth 5 (five) times daily. 2400; 0500; 1000; 1400; 1900, Disp: , Rfl: ;  cetirizine (ZYRTEC) 10 MG tablet, Take 10 mg by mouth daily as needed for allergies., Disp: , Rfl:  divalproex (DEPAKOTE) 500 MG EC tablet, Take 1,000 mg by mouth 3 (three) times daily.  , Disp: , Rfl: ;  folic acid (FOLVITE) 1 MG tablet, Take 1 mg by mouth every morning., Disp: , Rfl: ;  furosemide (LASIX) 40 MG tablet, Take 1 tablet (40 mg total) by mouth daily., Disp: , Rfl: ;  glipiZIDE (GLUCOTROL) 5 MG tablet, Take 5 mg by mouth 2 (two) times daily before a  meal. , Disp: , Rfl:  isosorbide mononitrate (IMDUR) 60 MG 24 hr tablet, Take 180 mg by mouth daily., Disp: , Rfl: ;  levothyroxine (SYNTHROID, LEVOTHROID) 200 MCG tablet, Take 200 mcg by mouth daily. , Disp: , Rfl: ;  levothyroxine (SYNTHROID, LEVOTHROID) 50 MCG tablet, Take 50 mcg by mouth once a week. Sundays only along with tablet to equal total dose of on Sundays, Disp: , Rfl:  metoprolol (LOPRESSOR) 50 MG tablet, Take 1 tablet (50 mg total) by mouth 2 (two) times daily., Disp: 60 tablet, Rfl: 6;  nitroGLYCERIN (NITROSTAT) 0.4 MG SL tablet, Place 1 tablet (0.4 mg total) under the tongue every 5 (five) minutes as needed for chest pain (up to 3 doses)., Disp: , Rfl: ;  Olopatadine HCl 0.2 % SOLN, Apply to eye., Disp:  , Rfl: ;  omeprazole (PRILOSEC) 20 MG capsule, Take 40 mg by mouth daily., Disp: , Rfl:  Oxycodone HCl 10 MG TABS, Take 10 mg by mouth every 6 (six) hours as needed (for pain)., Disp: , Rfl: ;  pregabalin (LYRICA) 100 MG capsule, Take 100 mg by mouth 3 (three) times daily., Disp: , Rfl: ;  Propylene Glycol (SYSTANE BALANCE) 0.6 % SOLN, Place 1 drop into both eyes 3 (three) times daily., Disp: , Rfl: ;  rosuvastatin (CRESTOR) 20 MG tablet, Take 20 mg by mouth daily., Disp: , Rfl:  senna-docusate (SENOKOT-S) 8.6-50 MG per tablet, Take 1 tablet by mouth 2 (two) times daily., Disp: , Rfl: ;  sertraline (ZOLOFT) 50 MG tablet, Take 150 mg by mouth daily. , Disp: , Rfl: ;  warfarin (COUMADIN) 2.5 MG tablet, Take 2.5 mg by mouth daily. Takes at same time as 3mg  tablet for a 5.5mg  dose, Disp: , Rfl: ;  warfarin (COUMADIN) 3 MG tablet, Take 3 mg by mouth daily. Takes at same time with 2.5mg  tablet for a 5.5mg  dose, Disp: , Rfl:  zolpidem (AMBIEN) 5 MG tablet, Take 2.5 mg by mouth at bedtime as needed for sleep. For insomnia, Disp: , Rfl: ;  cefdinir (OMNICEF) 300 MG capsule, Take 300 mg by mouth 2 (two) times daily., Disp: , Rfl: ;  gabapentin (NEURONTIN) 300 MG capsule, Take 300 mg by mouth 2 (two) times daily., Disp: , Rfl: ;  sulfamethoxazole-trimethoprim (BACTRIM DS) 800-160 MG per tablet, Take 1 tablet by mouth 2 (two) times daily., Disp: , Rfl:   BP 168/72  Pulse 72  Temp(Src) 98.6 F (37 C) (Oral)  Resp 14  Ht 5\' 11"  (1.803 m)  Wt 233 lb (105.688 kg)  BMI 32.51 kg/m2  SpO2 98%  Body mass index is 32.51 kg/(m^2).           Review of Systems patient is very poor historian. He has poor hearing. He sometimes does not understand the questions. Review of systems was attempted but answers are not felt to be reliable. He does complain of numbness in both legs and pain in both legs right worse than left.    Objective:   Physical Exam BP 168/72  Pulse 72  Temp(Src) 98.6 F (37 C) (Oral)  Resp 14   Ht 5\' 11"  (1.803 m)  Wt 233 lb (105.688 kg)  BMI 32.51 kg/m2  SpO2 98% Gen.-alert and oriented x3 in no apparent distress-chronically ill appearing HEENT normal for age-very poor hearing  Lungs no rhonchi or wheezing Cardiovascular regular rhythm no murmurs carotid pulses 3+ palpable no bruits audible Abdomen soft nontender no palpable masses-obese Musculoskeletal free of  major deformities  Skin clear -no rashes Neurologic normal Lower extremities right leg with 1+ femoral pulse no distal pulses palpable Left leg with 2-3+ femoral pulse. Both feet have good capillary refill.  Recent Doppler exam by [facility] revealed a pressure of 90 in the right posterior tibial artery.   Today I repeated ABIs bilaterally-right leg is 0.38 in posterior tibial and left leg is 0.99 and posterior tibial   I have reviewed his previous intraoperative angiogram an operative note from his redo right femoral to tibioperoneal trunk bypass. He has severe diffuse disease at all levels and is not a candidate for further revascularization       Assessment:     Occluded right femoral to tibioperoneal trunk Gore-[place] bypass placed by me in 2011-not a candidate for a redo bypass because of severe diffuse disease. He does also report operative candidate with coronary artery disease and previous stroke Currently he has no cellulitis gangrene or infection but does complain of rest pain to some degree. His ABI is 0.38 which is not terrible.    Plan:     #1 no further redo bypass surgery right lower extremity #2 try to prevent pressure sores or sources of infection right lower extremity which will likely not heal #3 return in 6 months with repeat ABIs and see nurse practitioner #4 left lower extremity bypass continues to function well

## 2012-12-13 NOTE — Addendum Note (Signed)
Addended by: Sharee Pimple on: 12/13/2012 11:34 AM   Modules accepted: Orders

## 2012-12-20 ENCOUNTER — Encounter: Payer: Self-pay | Admitting: Internal Medicine

## 2013-01-03 ENCOUNTER — Ambulatory Visit: Payer: PRIVATE HEALTH INSURANCE | Admitting: Cardiology

## 2013-01-10 ENCOUNTER — Ambulatory Visit (INDEPENDENT_AMBULATORY_CARE_PROVIDER_SITE_OTHER): Payer: PRIVATE HEALTH INSURANCE | Admitting: Cardiology

## 2013-01-10 ENCOUNTER — Encounter: Payer: Self-pay | Admitting: Cardiology

## 2013-01-10 VITALS — BP 91/56 | HR 61 | Ht 71.0 in | Wt 224.0 lb

## 2013-01-10 DIAGNOSIS — I10 Essential (primary) hypertension: Secondary | ICD-10-CM

## 2013-01-10 DIAGNOSIS — I2510 Atherosclerotic heart disease of native coronary artery without angina pectoris: Secondary | ICD-10-CM

## 2013-01-10 DIAGNOSIS — I70229 Atherosclerosis of native arteries of extremities with rest pain, unspecified extremity: Secondary | ICD-10-CM

## 2013-01-10 DIAGNOSIS — E785 Hyperlipidemia, unspecified: Secondary | ICD-10-CM

## 2013-01-10 NOTE — Patient Instructions (Addendum)

## 2013-01-10 NOTE — Assessment & Plan Note (Signed)
Severe disease, managed conservatively by Dr. Hart Rochester as noted above.

## 2013-01-10 NOTE — Assessment & Plan Note (Signed)
No change to current regimen. 

## 2013-01-10 NOTE — Assessment & Plan Note (Signed)
Multivessel disease status post CABG. Plan is to continue medical therapy and observation.

## 2013-01-10 NOTE — Assessment & Plan Note (Signed)
LDL has been controlled well over time, continues on Crestor.

## 2013-01-10 NOTE — Progress Notes (Signed)
Clinical Summary [name] is a medically complex 67 y.o.male last seen in March. It is here with family members, continues to reside at her nursing facility. He is a limited historian, does not provide any complaints of chest pain. He is mainly focused on right leg pain and claudication. He uses a walker.  Interval followup with Dr. Hart Rochester noted. He has a history of occluded right femoral to tibioperoneal trunk Gortex bypass placed in 2011, not felt to be a candidate for redo due to severe diffuse disease. Right-sided ABI 0.38, left side 0.99. He has been managed conservatively.  Myoview study from September 2013 revealed evidence of inferior scar without ischemia, LVEF 48%. LDL in September 2013 was 50.   Allergies  Allergen Reactions  . Morphine And Related Other (See Comments)    [facility]    Current Outpatient Prescriptions  Medication Sig Dispense Refill  . ALPRAZolam (XANAX) 1 MG tablet Take 1 mg by mouth 3 (three) times daily. scheduled      . amLODipine (NORVASC) 10 MG tablet Take 10 mg by mouth daily.        . carbamazepine (CARBATROL) 200 MG 12 hr capsule Take 200 mg by mouth 5 (five) times daily. 2400; 0500; 1000; 1400; 1900      . cetirizine (ZYRTEC) 10 MG tablet Take 10 mg by mouth daily as needed for allergies.      Marland Kitchen divalproex (DEPAKOTE) 500 MG EC tablet Take 1,000 mg by mouth 3 (three) times daily.        . fentaNYL (DURAGESIC - DOSED MCG/HR) 25 MCG/HR patch Place 1 patch onto the skin every 3 (three) days.      . folic acid (FOLVITE) 1 MG tablet Take 1 mg by mouth every morning.      . furosemide (LASIX) 40 MG tablet Take 1 tablet (40 mg total) by mouth daily.      Marland Kitchen glipiZIDE (GLUCOTROL) 5 MG tablet Take 5 mg by mouth 2 (two) times daily before a meal.       . isosorbide mononitrate (IMDUR) 60 MG 24 hr tablet Take 180 mg by mouth daily.      Marland Kitchen levothyroxine (SYNTHROID, LEVOTHROID) 200 MCG tablet Take 200 mcg by mouth daily.       Marland Kitchen levothyroxine (SYNTHROID, LEVOTHROID)  50 MCG tablet Take 50 mcg by mouth once a week. Sundays only along with tablet to equal total dose of on Sundays      . loratadine (CLARITIN) 10 MG tablet Take 10 mg by mouth as needed for allergies.      . metoprolol (LOPRESSOR) 50 MG tablet Take 1 tablet (50 mg total) by mouth 2 (two) times daily.  60 tablet  6  . nitroGLYCERIN (NITROSTAT) 0.4 MG SL tablet Place 1 tablet (0.4 mg total) under the tongue every 5 (five) minutes as needed for chest pain (up to 3 doses).      . Olopatadine HCl 0.2 % SOLN Apply to eye.      Marland Kitchen omeprazole (PRILOSEC) 20 MG capsule Take 40 mg by mouth daily.      . Oxycodone HCl 10 MG TABS Take 10 mg by mouth every 6 (six) hours as needed (for pain).      . pregabalin (LYRICA) 100 MG capsule Take 100 mg by mouth 3 (three) times daily.      Marland Kitchen Propylene Glycol (SYSTANE BALANCE) 0.6 % SOLN Place 1 drop into both eyes 3 (three) times daily.      Marland Kitchen  rosuvastatin (CRESTOR) 20 MG tablet Take 20 mg by mouth daily.      Marland Kitchen senna-docusate (SENOKOT-S) 8.6-50 MG per tablet Take 1 tablet by mouth 2 (two) times daily.      . sertraline (ZOLOFT) 50 MG tablet Take 150 mg by mouth daily.       . Skin Protectants, Misc. (EUCERIN) cream Apply 1 application topically as needed for dry skin.      Marland Kitchen warfarin (COUMADIN) 2.5 MG tablet Take 2.5 mg by mouth daily. Takes at same time as 3mg  tablet for a 5.5mg  dose      . warfarin (COUMADIN) 3 MG tablet Take 3 mg by mouth daily. Takes at same time with 2.5mg  tablet for a 5.5mg  dose      . zolpidem (AMBIEN) 5 MG tablet Take 2.5 mg by mouth at bedtime as needed for sleep. For insomnia       No current facility-administered medications for this visit.    Past Medical History  Diagnosis Date  . PVD (peripheral vascular disease)     Severe lower extremitiy  . Type 2 diabetes mellitus   . Mixed hyperlipidemia   . Obesity   . Seizure disorder   . OSA (obstructive sleep apnea)   . CVA (cerebrovascular accident) 2004    Persistent  residual expressive aphasia  . Coronary atherosclerosis of native coronary artery     Multivessel  . Chest discomfort     Chronic on narcotics - Dr. Sherril Croon  . Cervical spine disease   . Iron deficiency anemia     Managed by Dr. Myna Hidalgo  . PAF (paroxysmal atrial fibrillation)   . Carotid artery disease   . GERD (gastroesophageal reflux disease)   . Myocardial infarction   . Colon cancer 2007    Past Surgical History  Procedure Laterality Date  . Carotid endarterectomy      Left  . Partial colectomy    . Coronary artery bypass graft  05/2006    LIMA to diagonal, RIMA to LAD, radial to OM, radial to PDA  . Left shoulder surgery    . Cholecystectomy    . Femoral artery - popliteal artery bypass graft      Bilateral  . Tonsillectomy and adenoidectomy      Social History [name] reports that he quit smoking about 22 years ago. His smoking use included Cigarettes. He has a 25 pack-year smoking history. He has never used smokeless tobacco. [name] reports that he does not drink alcohol.  Review of Systems Appetite seems to be OK. He denies any bleeding problems or falls. Otherwise negative except as outlined.  Physical Examination Filed Vitals:   01/10/13 1010  BP: 91/56  Pulse: 61   Filed Weights   01/10/13 1010  Weight: 224 lb (101.606 kg)    Overweight, chronically ill-appearing.  HEENT: Conjunctiva and lids normal, oropharynx clear  Neck: Supple, no elevated JVP, left-sided scar from endarterectomy, soft bruits.  Lungs: Diminished breath sounds, nonlabored.  Cardiac: Regular rate and rhythm, no S3 gallop or rub.  Thorax: Well-healed midline sternal incision.  Abdomen: Soft, nontender, bowel sounds present.  Skin: Warm and dry.  Extremities: 1-2+ edema below the knees, symmetrical, distal pulses very diminished. Scars from prior bilateral radial artery harvest noted.  Neuropsychiatric: Alert and oriented x3, expressive aphasia.   Problem List and Plan   Coronary  atherosclerosis of native coronary artery Multivessel disease status post CABG. Plan is to continue medical therapy and observation.  Essential hypertension, benign  No change to current regimen.  Hyperlipidemia LDL has been controlled well over time, continues on Crestor.  Atherosclerosis of native arteries of the extremities with rest pain Severe disease, managed conservatively by Dr. Hart Rochester as noted above.    Jonelle Sidle, M.D., F.A.C.C.

## 2013-01-11 ENCOUNTER — Other Ambulatory Visit: Payer: Self-pay | Admitting: *Deleted

## 2013-01-11 MED ORDER — PREGABALIN 100 MG PO CAPS: ORAL_CAPSULE | ORAL | Status: DC

## 2013-01-16 ENCOUNTER — Other Ambulatory Visit: Payer: Self-pay

## 2013-01-16 MED ORDER — FENTANYL 25 MCG/HR TD PT72: MEDICATED_PATCH | TRANSDERMAL | Status: DC

## 2013-01-16 NOTE — Telephone Encounter (Signed)
Verified dose and instructions reflect manual request received by nursing home.   

## 2013-01-19 ENCOUNTER — Other Ambulatory Visit: Payer: Self-pay | Admitting: *Deleted

## 2013-01-19 MED ORDER — FENTANYL 12 MCG/HR TD PT72: 1.0000 | MEDICATED_PATCH | TRANSDERMAL | Status: DC

## 2013-05-07 ENCOUNTER — Telehealth: Payer: Self-pay | Admitting: Cardiology

## 2013-05-09 NOTE — Telephone Encounter (Signed)
Informed staff at [facility] and patient scheduled for this coming Friday in our coumadin clinic.

## 2013-05-09 NOTE — Telephone Encounter (Signed)
Joelene Millin NP for [facility] informed. She informed nurse that appointment would need to be set up with Colletta Maryland at [facility].

## 2013-05-11 ENCOUNTER — Ambulatory Visit (INDEPENDENT_AMBULATORY_CARE_PROVIDER_SITE_OTHER): Payer: PRIVATE HEALTH INSURANCE | Admitting: *Deleted

## 2013-05-11 DIAGNOSIS — Z5181 Encounter for therapeutic drug level monitoring: Secondary | ICD-10-CM | POA: Insufficient documentation

## 2013-05-11 DIAGNOSIS — I4891 Unspecified atrial fibrillation: Secondary | ICD-10-CM

## 2013-05-11 LAB — POCT INR: INR: 2.5

## 2013-05-15 ENCOUNTER — Ambulatory Visit (INDEPENDENT_AMBULATORY_CARE_PROVIDER_SITE_OTHER): Payer: PRIVATE HEALTH INSURANCE | Admitting: *Deleted

## 2013-05-15 ENCOUNTER — Other Ambulatory Visit: Payer: Self-pay | Admitting: *Deleted

## 2013-05-15 DIAGNOSIS — I4891 Unspecified atrial fibrillation: Secondary | ICD-10-CM

## 2013-05-15 DIAGNOSIS — Z5181 Encounter for therapeutic drug level monitoring: Secondary | ICD-10-CM

## 2013-05-15 LAB — POCT INR: INR: 1.3

## 2013-05-15 MED ORDER — FENTANYL 12 MCG/HR TD PT72: MEDICATED_PATCH | TRANSDERMAL | Status: DC

## 2013-05-21 ENCOUNTER — Ambulatory Visit (INDEPENDENT_AMBULATORY_CARE_PROVIDER_SITE_OTHER): Payer: PRIVATE HEALTH INSURANCE | Admitting: *Deleted

## 2013-05-21 ENCOUNTER — Telehealth: Payer: Self-pay | Admitting: *Deleted

## 2013-05-21 DIAGNOSIS — I4891 Unspecified atrial fibrillation: Secondary | ICD-10-CM

## 2013-05-21 DIAGNOSIS — Z5181 Encounter for therapeutic drug level monitoring: Secondary | ICD-10-CM

## 2013-05-21 LAB — PROTIME-INR: INR: 4.1 — AB (ref 0.9–1.1)

## 2013-05-21 NOTE — Telephone Encounter (Signed)
See coumadin note. 

## 2013-05-21 NOTE — Telephone Encounter (Signed)
Spoke with lab tech at [facility] and she informed nurse that patient was put on doxycyline over the weekend by Dr. Eula Fried for a boil. Lab tech was instructed by the pharmacy to have INR checks every 3 days and she wanted clarification or verbal from our office since his INR were managed here whether she should do them at the facility. Please advise.

## 2013-05-29 ENCOUNTER — Ambulatory Visit (INDEPENDENT_AMBULATORY_CARE_PROVIDER_SITE_OTHER): Payer: PRIVATE HEALTH INSURANCE | Admitting: *Deleted

## 2013-05-29 NOTE — Patient Instructions (Addendum)
Spoke with nurse at [facility].  INR 2.0 on 05/25/13.  Discussed changing pt to Xarelto with Dr Domenic Polite previously.  He is in agreement if labs were OK.  Xarelto will be much easier for the nursing home to manage and INR's have been unstable.   Pt was started on Xarelto for Atrial fib on 05/28/13.    Reviewed patients medication list.  Pt is not currently on any combined P-gp and strong CYP3A4 inhibitors/inducers (ketoconazole, traconazole, ritonavir, carbamazepine, phenytoin, rifampin, St. John's wort).  Reviewed labs from 05/21/13.  SCr 0.76, Weight 101.6 kg, CrCl- 135.54.  Dose is appro based on CrCl.   Hgb and HCT 12.1/36.4  A full discussion of the nature of anticoagulants has been carried out.  A benefit/risk analysis has been presented to the patient, so that they understand the justification for choosing anticoagulation with Xarelto at this time.  The need for compliance is stressed.  Pt is aware to take the medication once daily with the largest meal of the day.  Side effects of potential bleeding are discussed, including unusual colored urine or stools, coughing up blood or coffee ground emesis, nose bleeds or serious fall or head trauma.  Discussed signs and symptoms of stroke. The patient should avoid any OTC items containing aspirin or ibuprofen.  Avoid alcohol consumption.   Call if any signs of abnormal bleeding.  Discussed financial obligations and resolved any difficulty in obtaining medication.  Next lab test test in 1 month.   Stopped coumadin on 2/9.  Started Xarelto 20mg  daily with even meal on 05/29/13.  Will repeat CBC/BMP in 1 month.  Orders given to Nurse at [facility] and orders faxed.

## 2013-05-31 ENCOUNTER — Other Ambulatory Visit: Payer: Self-pay | Admitting: *Deleted

## 2013-05-31 MED ORDER — FENTANYL 12 MCG/HR TD PT72: MEDICATED_PATCH | TRANSDERMAL | Status: DC

## 2013-05-31 NOTE — Telephone Encounter (Signed)
[facility] 

## 2013-06-19 ENCOUNTER — Ambulatory Visit: Payer: PRIVATE HEALTH INSURANCE | Admitting: Family

## 2013-06-19 ENCOUNTER — Encounter (HOSPITAL_COMMUNITY): Payer: PRIVATE HEALTH INSURANCE

## 2013-06-19 ENCOUNTER — Other Ambulatory Visit (HOSPITAL_COMMUNITY): Payer: PRIVATE HEALTH INSURANCE

## 2013-07-11 ENCOUNTER — Ambulatory Visit: Payer: PRIVATE HEALTH INSURANCE | Admitting: Cardiology

## 2013-07-16 ENCOUNTER — Encounter: Payer: Self-pay | Admitting: Family

## 2013-07-17 ENCOUNTER — Encounter (HOSPITAL_COMMUNITY): Payer: PRIVATE HEALTH INSURANCE

## 2013-07-17 ENCOUNTER — Ambulatory Visit: Payer: PRIVATE HEALTH INSURANCE | Admitting: Family

## 2013-07-17 ENCOUNTER — Other Ambulatory Visit (HOSPITAL_COMMUNITY): Payer: PRIVATE HEALTH INSURANCE

## 2013-07-23 ENCOUNTER — Other Ambulatory Visit: Payer: Self-pay | Admitting: *Deleted

## 2013-07-23 MED ORDER — PREGABALIN 100 MG PO CAPS: ORAL_CAPSULE | ORAL | Status: DC

## 2013-07-23 NOTE — Telephone Encounter (Signed)
[facility] 

## 2013-07-27 ENCOUNTER — Encounter: Payer: Self-pay | Admitting: Cardiology

## 2013-07-27 ENCOUNTER — Ambulatory Visit (INDEPENDENT_AMBULATORY_CARE_PROVIDER_SITE_OTHER): Payer: PRIVATE HEALTH INSURANCE | Admitting: Cardiology

## 2013-07-27 VITALS — BP 117/65 | HR 66 | Ht 69.0 in | Wt 208.4 lb

## 2013-07-27 DIAGNOSIS — I4891 Unspecified atrial fibrillation: Secondary | ICD-10-CM

## 2013-07-27 DIAGNOSIS — E785 Hyperlipidemia, unspecified: Secondary | ICD-10-CM

## 2013-07-27 DIAGNOSIS — I10 Essential (primary) hypertension: Secondary | ICD-10-CM

## 2013-07-27 DIAGNOSIS — I2510 Atherosclerotic heart disease of native coronary artery without angina pectoris: Secondary | ICD-10-CM

## 2013-07-27 NOTE — Progress Notes (Signed)
Clinical Summary [name] is a medically complex 68 y.o.male last seen in September 2014. Continues to reside at [facility] facility. He is here with an Environmental consultant. I reviewed notes indicating episodes of weakness, diaphoresis, intermittent chest pain as well. He is a difficult historian to follow. He did have an ECG with one of these events in March that showed sinus bradycardia, nonspecific ST-T changes. He does not impart to me any sense of increasing frequency of chest pain symptoms, tells that his leg pain is actually better. He is able to walk some, also uses a wheelchair.  Patient has been followed by Dr. Kellie Simmering. He has a history of occluded right femoral to tibioperoneal trunk Gortex bypass placed in 2011, not felt to be a candidate for redo due to severe diffuse disease. Right-sided ABI 0.38, left side 0.99. He has been managed conservatively.  Myoview study from September 2013 revealed evidence of inferior scar without ischemia, LVEF 48%  He has been switched from Coumadin to Xarelto since our last visit. Still followed in the anticoagulation clinic.    Allergies  Allergen Reactions  . Morphine And Related Other (See Comments)    Per [facility]    Current Outpatient Prescriptions  Medication Sig Dispense Refill  . ALPRAZolam (XANAX) 1 MG tablet Take 1 mg by mouth 3 (three) times daily. scheduled      . amLODipine (NORVASC) 10 MG tablet Take 10 mg by mouth daily.        . carbamazepine (CARBATROL) 200 MG 12 hr capsule Take 200 mg by mouth 5 (five) times daily. 2400; 0500; 1000; 1400; 1900      . divalproex (DEPAKOTE) 500 MG DR tablet Take 750mg  by mouth daily & 1,000mg  twice a day      . fentaNYL (DURAGESIC - DOSED MCG/HR) 12 MCG/HR Apply one patch topically to skin every 72 hours along with 52mcg to equal 74mcg dose. Remove old patch. External use only. Rotate sites  10 patch  0  . fentaNYL (DURAGESIC - DOSED MCG/HR) 25 MCG/HR patch Apply 1 patch every 72 hours **REMOVE OLD  PATCH** EXTERNAL USE ONLY** ALONG WITH 12 MCG TO EQUAL TO 37 MCG**ROTATE SITES**  10 patch  0  . folic acid (FOLVITE) 1 MG tablet Take 1 mg by mouth every morning.      . furosemide (LASIX) 40 MG tablet Take 1 tablet (40 mg total) by mouth daily.      Marland Kitchen glipiZIDE (GLUCOTROL) 5 MG tablet Take 5 mg by mouth daily.       Marland Kitchen ipratropium-albuterol (DUONEB) 0.5-2.5 (3) MG/3ML SOLN Take 3 mLs by nebulization every 6 (six) hours as needed.      . isosorbide mononitrate (IMDUR) 60 MG 24 hr tablet Take 180 mg by mouth daily.      Marland Kitchen levothyroxine (SYNTHROID, LEVOTHROID) 200 MCG tablet Take 200 mcg by mouth daily.       Marland Kitchen levothyroxine (SYNTHROID, LEVOTHROID) 50 MCG tablet Take 50 mcg by mouth once a week. Sundays only along with 244mcg tablet to equal total dose of 231mcg on Sundays      . loratadine (CLARITIN) 10 MG tablet Take 10 mg by mouth as needed for allergies.      . metoprolol succinate (TOPROL-XL) 50 MG 24 hr tablet Take 50 mg by mouth every morning. Take with or immediately following a meal.      . nitroGLYCERIN (NITROSTAT) 0.4 MG SL tablet Place 0.4 mg under the tongue every 5 (five) minutes  as needed for chest pain (up to 3 doses).      . Olopatadine HCl 0.2 % SOLN Apply to eye.      Marland Kitchen omeprazole (PRILOSEC) 20 MG capsule Take 40 mg by mouth daily.      . Oxycodone HCl 10 MG TABS Take 10 mg by mouth every 6 (six) hours as needed (for pain).      . potassium chloride SA (K-DUR,KLOR-CON) 20 MEQ tablet Take 20 mEq by mouth daily.      . pregabalin (LYRICA) 100 MG capsule Take one capsule by mouth every eight hours for neuropathic pain R/T DM  90 capsule  5  . Propylene Glycol (SYSTANE BALANCE) 0.6 % SOLN Place 1 drop into both eyes 3 (three) times daily.      . Rivaroxaban (XARELTO) 20 MG TABS tablet Take 20 mg by mouth daily with supper.      . rosuvastatin (CRESTOR) 20 MG tablet Take 20 mg by mouth daily.      Marland Kitchen senna-docusate (SENOKOT-S) 8.6-50 MG per tablet Take 1 tablet by mouth 2 (two) times  daily.      . sertraline (ZOLOFT) 100 MG tablet Take 200 mg by mouth daily.      . Skin Protectants, Misc. (EUCERIN) cream Apply 1 application topically as needed for dry skin.      Marland Kitchen zolpidem (AMBIEN) 5 MG tablet Take 2.5 mg by mouth at bedtime as needed for sleep. For insomnia       No current facility-administered medications for this visit.    Past Medical History  Diagnosis Date  . PVD (peripheral vascular disease)     Severe lower extremitiy  . Type 2 diabetes mellitus   . Mixed hyperlipidemia   . Obesity   . Seizure disorder   . OSA (obstructive sleep apnea)   . CVA (cerebrovascular accident) 2004    Persistent residual expressive aphasia  . Coronary atherosclerosis of native coronary artery     Multivessel  . Chest discomfort     Chronic on narcotics - Dr. Woody Seller  . Cervical spine disease   . Iron deficiency anemia     Managed by Dr. Marin Olp  . PAF (paroxysmal atrial fibrillation)   . Carotid artery disease   . GERD (gastroesophageal reflux disease)   . Myocardial infarction   . Colon cancer 2007    Past Surgical History  Procedure Laterality Date  . Carotid endarterectomy      Left  . Partial colectomy    . Coronary artery bypass graft  05/2006    LIMA to diagonal, RIMA to LAD, radial to OM, radial to PDA  . Left shoulder surgery    . Cholecystectomy    . Femoral artery - popliteal artery bypass graft      Bilateral  . Tonsillectomy and adenoidectomy      Social History Mr. Nguyenthi reports that he quit smoking about 22 years ago. His smoking use included Cigarettes. He has a 25 pack-year smoking history. He has never used smokeless tobacco. Mr. Statler reports that he does not drink alcohol.  Review of Systems As outlined above.  Physical Examination Filed Vitals:   07/27/13 1054  BP: 117/65  Pulse: 66   Filed Weights   07/27/13 1054  Weight: 208 lb 6.4 oz (94.53 kg)    Overweight, chronically ill-appearing. Sitting in wheelchair. HEENT: Conjunctiva  and lids normal, oropharynx clear with poor dentition. Neck: Supple, no elevated JVP, left-sided scar from endarterectomy, soft bruits.  Lungs:  Diminished breath sounds, nonlabored.  Cardiac: Regular rate and rhythm, no S3 gallop or rub.  Thorax: Well-healed midline sternal incision.  Abdomen: Soft, nontender, bowel sounds present.  Skin: Warm and dry.  Extremities: 1-2+ edema below the knees, symmetrical, distal pulses very diminished. Scars from prior bilateral radial artery harvest noted.  Neuropsychiatric: Alert and oriented x3, expressive aphasia.   Problem List and Plan   Coronary atherosclerosis of native coronary artery Would continue medical therapy and observation for now. His cardiac medical regimen looks good overall, blood pressure and heart rate are normal today. Recent ECG reviewed finding sinus bradycardia with nonspecific ST-T changes. Frankly, difficult to follow the details of his history, and he has had chest pain symptoms over time that have been noncardiac as well, on narcotics. Ischemic testing within the last 2 years with low risk.  Essential hypertension, benign Blood pressure today is normal.  Hyperlipidemia He continues on Crestor, followed by Dr. Eula Fried.  Atrial fibrillation Paroxysmal, most recently in sinus rhythm by ECG. He has been switched from Coumadin to Xarelto. Needs to maintain intermittent followup with CBC and BMET, has been assessed in our anticoagulation clinic.    Satira Sark, M.D., F.A.C.C.

## 2013-07-27 NOTE — Assessment & Plan Note (Signed)
He continues on Crestor, followed by Dr. Eula Fried.

## 2013-07-27 NOTE — Assessment & Plan Note (Signed)
Blood pressure today is normal. 

## 2013-07-27 NOTE — Assessment & Plan Note (Signed)
Would continue medical therapy and observation for now. His cardiac medical regimen looks good overall, blood pressure and heart rate are normal today. Recent ECG reviewed finding sinus bradycardia with nonspecific ST-T changes. Frankly, difficult to follow the details of his history, and he has had chest pain symptoms over time that have been noncardiac as well, on narcotics. Ischemic testing within the last 2 years with low risk.

## 2013-07-27 NOTE — Patient Instructions (Signed)

## 2013-07-27 NOTE — Assessment & Plan Note (Signed)
Paroxysmal, most recently in sinus rhythm by ECG. He has been switched from Coumadin to Xarelto. Needs to maintain intermittent followup with CBC and BMET, has been assessed in our anticoagulation clinic.

## 2013-08-12 ENCOUNTER — Emergency Department (HOSPITAL_COMMUNITY)
Admission: EM | Admit: 2013-08-12 | Discharge: 2013-08-12 | Disposition: A | Discharge status: Home / Self Care | Payer: PRIVATE HEALTH INSURANCE | Attending: Emergency Medicine | Admitting: Emergency Medicine

## 2013-08-12 ENCOUNTER — Emergency Department (HOSPITAL_COMMUNITY): Payer: PRIVATE HEALTH INSURANCE

## 2013-08-12 ENCOUNTER — Encounter (HOSPITAL_COMMUNITY): Payer: Self-pay | Admitting: Emergency Medicine

## 2013-08-12 DIAGNOSIS — S0990XA Unspecified injury of head, initial encounter: Secondary | ICD-10-CM

## 2013-08-12 DIAGNOSIS — Y939 Activity, unspecified: Secondary | ICD-10-CM | POA: Insufficient documentation

## 2013-08-12 DIAGNOSIS — E119 Type 2 diabetes mellitus without complications: Secondary | ICD-10-CM | POA: Insufficient documentation

## 2013-08-12 DIAGNOSIS — Z85038 Personal history of other malignant neoplasm of large intestine: Secondary | ICD-10-CM | POA: Insufficient documentation

## 2013-08-12 DIAGNOSIS — I4891 Unspecified atrial fibrillation: Secondary | ICD-10-CM | POA: Insufficient documentation

## 2013-08-12 DIAGNOSIS — Z8673 Personal history of transient ischemic attack (TIA), and cerebral infarction without residual deficits: Secondary | ICD-10-CM | POA: Insufficient documentation

## 2013-08-12 DIAGNOSIS — I252 Old myocardial infarction: Secondary | ICD-10-CM | POA: Insufficient documentation

## 2013-08-12 DIAGNOSIS — Z8739 Personal history of other diseases of the musculoskeletal system and connective tissue: Secondary | ICD-10-CM | POA: Insufficient documentation

## 2013-08-12 DIAGNOSIS — Z951 Presence of aortocoronary bypass graft: Secondary | ICD-10-CM | POA: Insufficient documentation

## 2013-08-12 DIAGNOSIS — I2510 Atherosclerotic heart disease of native coronary artery without angina pectoris: Secondary | ICD-10-CM | POA: Insufficient documentation

## 2013-08-12 DIAGNOSIS — Z79899 Other long term (current) drug therapy: Secondary | ICD-10-CM | POA: Insufficient documentation

## 2013-08-12 DIAGNOSIS — E669 Obesity, unspecified: Secondary | ICD-10-CM | POA: Insufficient documentation

## 2013-08-12 DIAGNOSIS — S1093XA Contusion of unspecified part of neck, initial encounter: Principal | ICD-10-CM

## 2013-08-12 DIAGNOSIS — S0003XA Contusion of scalp, initial encounter: Secondary | ICD-10-CM | POA: Insufficient documentation

## 2013-08-12 DIAGNOSIS — Z87891 Personal history of nicotine dependence: Secondary | ICD-10-CM | POA: Insufficient documentation

## 2013-08-12 DIAGNOSIS — F0390 Unspecified dementia without behavioral disturbance: Secondary | ICD-10-CM | POA: Insufficient documentation

## 2013-08-12 DIAGNOSIS — E782 Mixed hyperlipidemia: Secondary | ICD-10-CM | POA: Insufficient documentation

## 2013-08-12 DIAGNOSIS — S0083XA Contusion of other part of head, initial encounter: Secondary | ICD-10-CM | POA: Insufficient documentation

## 2013-08-12 DIAGNOSIS — Y929 Unspecified place or not applicable: Secondary | ICD-10-CM | POA: Insufficient documentation

## 2013-08-12 DIAGNOSIS — W19XXXA Unspecified fall, initial encounter: Secondary | ICD-10-CM | POA: Insufficient documentation

## 2013-08-12 DIAGNOSIS — G40909 Epilepsy, unspecified, not intractable, without status epilepticus: Secondary | ICD-10-CM | POA: Insufficient documentation

## 2013-08-12 DIAGNOSIS — D509 Iron deficiency anemia, unspecified: Secondary | ICD-10-CM | POA: Insufficient documentation

## 2013-08-12 DIAGNOSIS — K219 Gastro-esophageal reflux disease without esophagitis: Secondary | ICD-10-CM | POA: Insufficient documentation

## 2013-08-12 LAB — CBC WITH DIFFERENTIAL/PLATELET
Basophils Absolute: 0 10*3/uL (ref 0.0–0.1)
Basophils Relative: 0 % (ref 0–1)
EOS PCT: 4 % (ref 0–5)
Eosinophils Absolute: 0.2 10*3/uL (ref 0.0–0.7)
HEMATOCRIT: 37 % — AB (ref 39.0–52.0)
HEMOGLOBIN: 12.7 g/dL — AB (ref 13.0–17.0)
Lymphocytes Relative: 32 % (ref 12–46)
Lymphs Abs: 2 10*3/uL (ref 0.7–4.0)
MCH: 31.7 pg (ref 26.0–34.0)
MCHC: 34.3 g/dL (ref 30.0–36.0)
MCV: 92.3 fL (ref 78.0–100.0)
MONO ABS: 1 10*3/uL (ref 0.1–1.0)
Monocytes Relative: 17 % — ABNORMAL HIGH (ref 3–12)
Neutro Abs: 2.8 10*3/uL (ref 1.7–7.7)
Neutrophils Relative %: 47 % (ref 43–77)
Platelets: 213 10*3/uL (ref 150–400)
RBC: 4.01 MIL/uL — ABNORMAL LOW (ref 4.22–5.81)
RDW: 13.3 % (ref 11.5–15.5)
WBC: 6.1 10*3/uL (ref 4.0–10.5)

## 2013-08-12 LAB — URINALYSIS, ROUTINE W REFLEX MICROSCOPIC
Bilirubin Urine: NEGATIVE
Glucose, UA: NEGATIVE mg/dL
HGB URINE DIPSTICK: NEGATIVE
Ketones, ur: NEGATIVE mg/dL
LEUKOCYTES UA: NEGATIVE
Nitrite: NEGATIVE
PROTEIN: NEGATIVE mg/dL
Specific Gravity, Urine: 1.015 (ref 1.005–1.030)
Urobilinogen, UA: 0.2 mg/dL (ref 0.0–1.0)
pH: 6 (ref 5.0–8.0)

## 2013-08-12 LAB — COMPREHENSIVE METABOLIC PANEL
ALT: 5 U/L (ref 0–53)
AST: 11 U/L (ref 0–37)
Albumin: 3 g/dL — ABNORMAL LOW (ref 3.5–5.2)
Alkaline Phosphatase: 54 U/L (ref 39–117)
BILIRUBIN TOTAL: 0.3 mg/dL (ref 0.3–1.2)
BUN: 30 mg/dL — AB (ref 6–23)
CHLORIDE: 99 meq/L (ref 96–112)
CO2: 29 meq/L (ref 19–32)
CREATININE: 0.87 mg/dL (ref 0.50–1.35)
Calcium: 9.1 mg/dL (ref 8.4–10.5)
GFR calc non Af Amer: 87 mL/min — ABNORMAL LOW (ref >90)
GLUCOSE: 133 mg/dL — AB (ref 70–99)
Potassium: 4.8 mEq/L (ref 3.7–5.3)
Sodium: 138 mEq/L (ref 137–147)
Total Protein: 8.3 g/dL (ref 6.0–8.3)

## 2013-08-12 MED ORDER — SODIUM CHLORIDE 0.9 % IV BOLUS (SEPSIS)
250.0000 mL | Freq: Once | INTRAVENOUS | Status: AC
  Administered 2013-08-12: 1000 mL via INTRAVENOUS

## 2013-08-12 MED ORDER — SODIUM CHLORIDE 0.9 % IV SOLN: Freq: Once | INTRAVENOUS | Status: DC

## 2013-08-12 NOTE — ED Notes (Signed)
Pt resident at [facility] - per EMS - pt had 2 un witnessed falls within 45 mins.  Per EMS, pupils were pinpoint upon their arrival.  Pt had a fentanyl patch that was removed by EMS.  Since removal of patch, pt has become more alert.  Pt alert upon arrival, abrasions noted to bridge of nose and forehead.

## 2013-08-12 NOTE — ED Notes (Signed)
Pt is a resident of [facility] home who had fallen twice within 45 minutes of each fall, first fall pt fell back into wheelchair with no injury, on second fall pt fell hitting his forehead area, pt arrived to er with abrasions noted to forehead and nasal area, and abrasion noted to left elbow, pt confused which is his baseline, EMS reports that upon their arrival pt had pin point pupils, ems did find one Duragesic patch and removed it with improvement in pt's LOC, pt denies any pain, able to move all extremities,

## 2013-08-12 NOTE — ED Provider Notes (Signed)
CSN: [id number]     Arrival date & time 08/12/13  1718 History   First MD Initiated Contact with Patient 08/12/13 1720     Chief Complaint  Patient presents with  . Fall     (Consider location/radiation/quality/duration/timing/severity/associated sxs/prior Treatment) HPI  [name] is a 68 y.o. male who is here for evaluation of fall x2. That occurred just prior to arriving. He is unable to give history. He presents by EMS with reported injury to face. They removed his Duragesic patch, at his facility.  Level 5 caveat: Dementia   Past Medical History  Diagnosis Date  . PVD (peripheral vascular disease)     Severe lower extremitiy  . Type 2 diabetes mellitus   . Mixed hyperlipidemia   . Obesity   . Seizure disorder   . OSA (obstructive sleep apnea)   . CVA (cerebrovascular accident) 2004    Persistent residual expressive aphasia  . Coronary atherosclerosis of native coronary artery     Multivessel  . Chest discomfort     Chronic on narcotics - Dr. Woody Seller  . Cervical spine disease   . Iron deficiency anemia     Managed by Dr. Marin Olp  . PAF (paroxysmal atrial fibrillation)   . Carotid artery disease   . GERD (gastroesophageal reflux disease)   . Myocardial infarction   . Colon cancer 2007   Past Surgical History  Procedure Laterality Date  . Carotid endarterectomy      Left  . Partial colectomy    . Coronary artery bypass graft  05/2006    LIMA to diagonal, RIMA to LAD, radial to OM, radial to PDA  . Left shoulder surgery    . Cholecystectomy    . Femoral artery - popliteal artery bypass graft      Bilateral  . Tonsillectomy and adenoidectomy     Family History  Problem Relation Age of Onset  . Diabetes Mother    History  Substance Use Topics  . Smoking status: Former Smoker -- 1.00 packs/day for 25 years    Types: Cigarettes    Quit date: 10/14/1990  . Smokeless tobacco: Never Used  . Alcohol Use: No    Review of Systems  All other systems reviewed  and are negative.     Allergies  Morphine and related  Home Medications   Prior to Admission medications   Medication Sig Start Date End Date Taking? Authorizing Provider  ALPRAZolam Duanne Moron) 1 MG tablet Take 1 mg by mouth 3 (three) times daily. scheduled    Historical Provider, MD  amLODipine (NORVASC) 10 MG tablet Take 10 mg by mouth daily.      Historical Provider, MD  carbamazepine (CARBATROL) 200 MG 12 hr capsule Take 200 mg by mouth 5 (five) times daily. 2400; 0500; 1000; 1400; 1900    Historical Provider, MD  divalproex (DEPAKOTE) 500 MG DR tablet Take 750mg  by mouth daily & 1,000mg  twice a day    Historical Provider, MD  fentaNYL (DURAGESIC - DOSED MCG/HR) 12 MCG/HR Apply one patch topically to skin every 72 hours along with 32mcg to equal 83mcg dose. Remove old patch. External use only. Rotate sites 05/31/13   Tiffany L Reed, DO  fentaNYL (DURAGESIC - DOSED MCG/HR) 25 MCG/HR patch Apply 1 patch every 72 hours **REMOVE OLD PATCH** EXTERNAL USE ONLY** ALONG WITH 12 MCG TO EQUAL TO 37 MCG**ROTATE SITES** 01/16/13   Estill Dooms, MD  folic acid (FOLVITE) 1 MG tablet Take 1 mg by mouth every  morning.    Historical Provider, MD  furosemide (LASIX) 40 MG tablet Take 1 tablet (40 mg total) by mouth daily. 01/21/12   Donney Dice, PA-C  glipiZIDE (GLUCOTROL) 5 MG tablet Take 5 mg by mouth daily.     Historical Provider, MD  ipratropium-albuterol (DUONEB) 0.5-2.5 (3) MG/3ML SOLN Take 3 mLs by nebulization every 6 (six) hours as needed.    Historical Provider, MD  isosorbide mononitrate (IMDUR) 60 MG 24 hr tablet Take 180 mg by mouth daily.    Historical Provider, MD  levothyroxine (SYNTHROID, LEVOTHROID) 200 MCG tablet Take 200 mcg by mouth daily.     Historical Provider, MD  levothyroxine (SYNTHROID, LEVOTHROID) 50 MCG tablet Take 50 mcg by mouth once a week. Sundays only along with 29mcg tablet to equal total dose of 235mcg on Sundays    Historical Provider, MD  loratadine (CLARITIN) 10 MG  tablet Take 10 mg by mouth as needed for allergies.    Historical Provider, MD  metoprolol succinate (TOPROL-XL) 50 MG 24 hr tablet Take 50 mg by mouth every morning. Take with or immediately following a meal.    Historical Provider, MD  nitroGLYCERIN (NITROSTAT) 0.4 MG SL tablet Place 0.4 mg under the tongue every 5 (five) minutes as needed for chest pain (up to 3 doses). 02/25/12   Donney Dice, PA-C  Olopatadine HCl 0.2 % SOLN Apply to eye.    Historical Provider, MD  omeprazole (PRILOSEC) 20 MG capsule Take 40 mg by mouth daily.    Historical Provider, MD  Oxycodone HCl 10 MG TABS Take 10 mg by mouth every 6 (six) hours as needed (for pain).    Historical Provider, MD  potassium chloride SA (K-DUR,KLOR-CON) 20 MEQ tablet Take 20 mEq by mouth daily.    Historical Provider, MD  pregabalin (LYRICA) 100 MG capsule Take one capsule by mouth every eight hours for neuropathic pain R/T DM 07/23/13   Tiffany L Reed, DO  Propylene Glycol (SYSTANE BALANCE) 0.6 % SOLN Place 1 drop into both eyes 3 (three) times daily.    Historical Provider, MD  Rivaroxaban (XARELTO) 20 MG TABS tablet Take 20 mg by mouth daily with supper.    Historical Provider, MD  rosuvastatin (CRESTOR) 20 MG tablet Take 20 mg by mouth daily.    Historical Provider, MD  senna-docusate (SENOKOT-S) 8.6-50 MG per tablet Take 1 tablet by mouth 2 (two) times daily.    Historical Provider, MD  sertraline (ZOLOFT) 100 MG tablet Take 200 mg by mouth daily.    Historical Provider, MD  Skin Protectants, Misc. (EUCERIN) cream Apply 1 application topically as needed for dry skin.    Historical Provider, MD  zolpidem (AMBIEN) 5 MG tablet Take 2.5 mg by mouth at bedtime as needed for sleep. For insomnia    Historical Provider, MD   BP 123/73  Pulse 62  Temp(Src) 98 F (36.7 C) (Oral)  Resp 11  SpO2 96% Physical Exam  Nursing note and vitals reviewed. Constitutional: He appears well-developed.  Appears older than stated age  HENT:  Head:  Normocephalic.  Right Ear: External ear normal.  Left Ear: External ear normal.  Mild contusions, midforehead, and upper nose, no abrasions, or bleeding. No nasal deformity, or epistaxis. Normal TMJ motion. No periorbital crepitation or ecchymosis.  Eyes: Conjunctivae and EOM are normal. Pupils are equal, round, and reactive to light.  Neck: Normal range of motion and phonation normal. Neck supple.  Cardiovascular: Normal rate, regular rhythm, normal heart sounds  and intact distal pulses.   Pulmonary/Chest: Effort normal and breath sounds normal. No respiratory distress. He has no wheezes. He exhibits no tenderness and no bony tenderness.  Abdominal: Soft. There is no tenderness.  Musculoskeletal: Normal range of motion.  Neurological: He is alert. No cranial nerve deficit or sensory deficit. He exhibits normal muscle tone (normal bulk). Coordination normal.  Mild dysarthria. Poor historian  Skin: Skin is warm, dry and intact.  Psychiatric: He has a normal mood and affect. His behavior is normal.    ED Course  Procedures (including critical care time)  Medications  0.9 %  sodium chloride infusion ( Intravenous Rate/Dose Change 08/12/13 1801)  sodium chloride 0.9 % bolus 250 mL (0 mLs Intravenous Stopped 08/12/13 1801)     Patient Vitals for the past 24 hrs:  BP Temp Temp src Pulse Resp SpO2  08/12/13 1926 123/73 mmHg - - 62 11 96 %  08/12/13 1722 158/70 mmHg 98 F (36.7 C) Oral 60 16 96 %    7:50 PM Reevaluation with update and discussion. After initial assessment and treatment, an updated evaluation reveals physical examination unchanged. Sister is now here. Findings discussed with sister, all questions answered. [place] Review Labs Reviewed  CBC WITH DIFFERENTIAL - Abnormal; Notable for the following:    RBC 4.01 (*)    Hemoglobin 12.7 (*)    HCT 37.0 (*)    Monocytes Relative 17 (*)    All other components within normal limits  COMPREHENSIVE METABOLIC PANEL  - Abnormal; Notable for the following:    Glucose, Bld 133 (*)    BUN 30 (*)    Albumin 3.0 (*)    GFR calc non Af Amer 87 (*)    All other components within normal limits  URINALYSIS, ROUTINE W REFLEX MICROSCOPIC    Imaging Review Ct Head Wo Contrast  08/12/2013   CLINICAL DATA:  Fall, trauma to head, altered level of consciousness  EXAM: CT HEAD WITHOUT CONTRAST  TECHNIQUE: Contiguous axial images were obtained from the base of the skull through the vertex without intravenous contrast.  COMPARISON:  CT HEAD W/O CM - CT CERV SPINE W/O CM dated 07/02/2013  FINDINGS: No acute intracranial hemorrhage. No focal mass lesion. No CT evidence of acute infarction. No midline shift or mass effect. No hydrocephalus. Basilar cisterns are patent.  There is remote infarction in the left temporal lobe. There is generalized cortical atrophy.  Paranasal sinuses and  mastoid air cells are clear.  IMPRESSION: 1. No acute intracranial findings. 2. Remote left temporal infarction. 3. Chronic atrophy and microvascular disease.   Electronically Signed   By: Suzy Bouchard M.D.   On: 08/12/2013 18:20     EKG Interpretation None      MDM   Final diagnoses:  Fall  Contusion of face  Head injury    Fall, cause unclear, but possibly related to use of narcotic pain patch. There is no overt sign of necessity for narcotic pain relievers in this patient. Doubt metabolic instability, serious bacterial infection, serious internal injury or impending vascular collapse  Nursing Notes Reviewed/ Care Coordinated Applicable Imaging Reviewed Interpretation of Laboratory Data incorporated into ED treatment  The patient appears reasonably screened and/or stabilized for discharge and I doubt any other medical condition or other [facility] requiring further screening, evaluation, or treatment in the ED at this time prior to discharge.  Plan: Home Medications- Stop Fentanul patch; Home Treatments- rest; return here if the  recommended treatment, does not improve the symptoms; Recommended follow up- PCP prn  [name], MD 08/12/13 [phone]

## 2013-08-12 NOTE — ED Notes (Signed)
Report given to Marcie Bal at [facility]; pt is being discharged;

## 2013-08-12 NOTE — Discharge Instructions (Signed)
Do not use the Duragesic patch. Minimize any, when necessary narcotic, treatments See her doctor for a checkup in 2 or 3 days.   Contusion A contusion is a deep bruise. Contusions are the result of an injury that caused bleeding under the skin. The contusion may turn blue, purple, or yellow. Minor injuries will give you a painless contusion, but more severe contusions may stay painful and swollen for a few weeks.  CAUSES  A contusion is usually caused by a blow, trauma, or direct force to an area of the body. SYMPTOMS   Swelling and redness of the injured area.  Bruising of the injured area.  Tenderness and soreness of the injured area.  Pain. DIAGNOSIS  The diagnosis can be made by taking a history and physical exam. An X-ray, CT scan, or MRI may be needed to determine if there were any associated injuries, such as fractures. TREATMENT  Specific treatment will depend on what area of the body was injured. In general, the best treatment for a contusion is resting, icing, elevating, and applying cold compresses to the injured area. Over-the-counter medicines may also be recommended for pain control. Ask your caregiver what the best treatment is for your contusion. HOME CARE INSTRUCTIONS   Put ice on the injured area.  Put ice in a plastic bag.  Place a towel between your skin and the bag.  Leave the ice on for 15-20 minutes, 03-04 times a day.  Only take over-the-counter or prescription medicines for pain, discomfort, or fever as directed by your caregiver. Your caregiver may recommend avoiding anti-inflammatory medicines (aspirin, ibuprofen, and naproxen) for 48 hours because these medicines may increase bruising.  Rest the injured area.  If possible, elevate the injured area to reduce swelling. SEEK IMMEDIATE MEDICAL CARE IF:   You have increased bruising or swelling.  You have pain that is getting worse.  Your swelling or pain is not relieved with medicines. MAKE SURE  YOU:   Understand these instructions.  Will watch your condition.  Will get help right away if you are not doing well or get worse. Document Released: 01/13/2005 Document Revised: 06/28/2011 Document Reviewed: 02/08/2011 [facility] Patient Information 2014 [place], [place].  Head Injury, Adult You have a head injury. Headaches and throwing up (vomiting) are common after a head injury. It should be easy to wake up from sleeping. Sometimes you must stay in the hospital. Most problems happen within the first 24 hours. Side effects may occur up to 7 10 days after the injury.  WHAT ARE THE TYPES OF HEAD INJURIES? Head injuries can be as minor as a bump. Some head injuries can be more severe. More severe head injuries include:  A jarring injury to the brain (concussion).  A bruise of the brain (contusion). This mean there is bleeding in the brain that can cause swelling.  A cracked skull (skull fracture).  Bleeding in the brain that collects, clots, and forms a bump (hematoma). . WHEN SHOULD I GET HELP RIGHT AWAY?   You are confused or sleepy.  You cannot be woken up.  You feel sick to your stomach (nauseous) or keep throwing up.  Your dizziness or unsteadiness is get worse.  You have very bad, lasting headaches that are not helped by medicine.  You cannot use your arms or legs like normal  You cannot walk.  You notice changes in the black spots in the center of the colored part of your eye (pupil).  You have clear or bloody  fluid coming from your nose or ears.  You have trouble seeing. During the next 24 hours after the injury, you must stay with someone who can watch you. This person should get help right away (call 911 in the [place] if you start to shake and are not able to control it (seizures), you become pass out, or you are unable to wake up. HOW CAN I PREVENT A HEAD INJURY IN THE FUTURE?  Wear seat belts.  Wear helmets while bike riding and playing sports like  football.  Stay away from dangerous activities around the house. WHEN CAN I RETURN TO NORMAL ACTIVITIES AND ATHLETICS? See your doctor before doing these activities. You should not do normal activities or play contact sports until 1 week after the following symptoms have stopped:  Headache that does not go away.  Dizziness.  Poor attention.  Confusion.  Memory problems.  Sickness to your stomach or throwing up.  Tiredness.  Fussiness.  Bothered by bright lights or loud noises.  Anxiousness or depression.  Restless sleep. MAKE SURE YOU:   Understand these instructions.  Will watch your condition.  Will get help right away if you are not doing well or get worse. Document Released: 03/18/2008 Document Revised: 01/24/2013 Document Reviewed: 12/11/2012 [facility] Patient Information 2014 [place].

## 2013-08-12 NOTE — ED Notes (Signed)
Pt's sister Coralyn Mark at bedside reports that pt has been having problems with getting dizzy and nauseous when he stands sometimes and has been having numerous tests done to find a diagnosis with nothing found yet,

## 2013-11-28 ENCOUNTER — Ambulatory Visit: Payer: Self-pay | Admitting: *Deleted

## 2013-11-28 DIAGNOSIS — Z5181 Encounter for therapeutic drug level monitoring: Secondary | ICD-10-CM

## 2013-11-28 DIAGNOSIS — I4891 Unspecified atrial fibrillation: Secondary | ICD-10-CM

## 2014-02-13 ENCOUNTER — Telehealth (INDEPENDENT_AMBULATORY_CARE_PROVIDER_SITE_OTHER): Payer: Self-pay | Admitting: *Deleted

## 2014-02-13 ENCOUNTER — Encounter (INDEPENDENT_AMBULATORY_CARE_PROVIDER_SITE_OTHER): Payer: Self-pay | Admitting: Internal Medicine

## 2014-02-13 ENCOUNTER — Other Ambulatory Visit (INDEPENDENT_AMBULATORY_CARE_PROVIDER_SITE_OTHER): Payer: Self-pay | Admitting: *Deleted

## 2014-02-13 ENCOUNTER — Ambulatory Visit (INDEPENDENT_AMBULATORY_CARE_PROVIDER_SITE_OTHER): Payer: PRIVATE HEALTH INSURANCE | Admitting: Internal Medicine

## 2014-02-13 VITALS — BP 70/58 | HR 64 | Temp 98.1°F | Ht 66.0 in | Wt 193.0 lb

## 2014-02-13 DIAGNOSIS — Z1211 Encounter for screening for malignant neoplasm of colon: Secondary | ICD-10-CM

## 2014-02-13 DIAGNOSIS — R6881 Early satiety: Secondary | ICD-10-CM | POA: Insufficient documentation

## 2014-02-13 DIAGNOSIS — R195 Other fecal abnormalities: Secondary | ICD-10-CM

## 2014-02-13 DIAGNOSIS — R10816 Epigastric abdominal tenderness: Secondary | ICD-10-CM | POA: Insufficient documentation

## 2014-02-13 DIAGNOSIS — R634 Abnormal weight loss: Secondary | ICD-10-CM

## 2014-02-13 LAB — HEPATIC FUNCTION PANEL
ALBUMIN: 3.4 g/dL — AB (ref 3.5–5.2)
ALT: <8 U/L (ref 0–53)
AST: 12 U/L (ref 0–37)
Alkaline Phosphatase: 41 U/L (ref 39–117)
BILIRUBIN INDIRECT: 0.2 mg/dL (ref 0.2–1.2)
Bilirubin, Direct: 0.2 mg/dL (ref 0.0–0.3)
Total Bilirubin: 0.4 mg/dL (ref 0.2–1.2)
Total Protein: 7.7 g/dL (ref 6.0–8.3)

## 2014-02-13 NOTE — Progress Notes (Signed)
Subjective:    Patient ID: [name], male    DOB: [birth year], 68 y.o.   MRN: [id number]  HPI Referred to our office by Dr. Eula Fried for abdominal pain. Patient is a resident of [facility]. He apparently has lost about 12 pounds from August to September.  Weight 12/25/2013 215. Today is weight is 193.  Patient has been having epigastric pain for at least 6 weeks. He has had decreased appetite. Has been using Mylanta usually on a daily basis. He has nausea and vomiting. He usually has a BM daily and sometimes he has diarrhea. He tells when he eats or drinks, he has pain in his epigastric region which he describes as a burning sensation. Rt hemicolectomy in 2007 at [facility] by Dr. Anthony Sar. Hx of atrial fib. Significant cardiac hx.  Patient maintained on Xarelto.   11/23/2013 OVA and PARA: negative  05/08/2011 Colonoscopy with snare polypectomy: Dr. Britta Mccreedy Colon transverse tubular adenoma. Descending colon: tubulovillous adenoma.  Colonoscopy 2008: Personal hx of colon cancer. Guaiac positive stool, Dr. Anthony Sar: Sessile polyp at the splenic flexure. Normal colonoscopy otherwise.  11/24/2005 Colonoscopy: Sessile polyp in the sigmoid colon. Sessile polyp in the ascending colon. Cancer of the ascending colon. Biopsy: well differentiated adenocarcinoma with superficial invasion into muscularis propria. Zero of thirteen lymph nodes positive for metastatic adenocarcinoma. Tubular adenoma. Tubular adenoma.   11/23/2005 EGD  Iron deficiency anemia: Dr Anthony Sar: Normal EGD Review of Systems Past Medical History  Diagnosis Date  . PVD (peripheral vascular disease)     Severe lower extremitiy  . Type 2 diabetes mellitus   . Mixed hyperlipidemia   . Obesity   . Seizure disorder   . OSA (obstructive sleep apnea)   . CVA (cerebrovascular accident) 2004    Persistent residual expressive aphasia  . Coronary atherosclerosis of native coronary artery     Multivessel  . Chest discomfort    Chronic on narcotics - Dr. Woody Seller  . Cervical spine disease   . Iron deficiency anemia     Managed by Dr. Marin Olp  . PAF (paroxysmal atrial fibrillation)   . Carotid artery disease   . GERD (gastroesophageal reflux disease)   . Myocardial infarction   . Colon cancer 2007    Past Surgical History  Procedure Laterality Date  . Carotid endarterectomy      Left  . Partial colectomy      2007  . Coronary artery bypass graft  05/2006    LIMA to diagonal, RIMA to LAD, radial to OM, radial to PDA  . Left shoulder surgery    . Cholecystectomy    . Femoral artery - popliteal artery bypass graft      Bilateral  . Tonsillectomy and adenoidectomy      Allergies  Allergen Reactions  . Morphine And Related Other (See Comments)    Per [facility]    Current Outpatient Prescriptions on File Prior to Visit  Medication Sig Dispense Refill  . ALPRAZolam (XANAX) 0.5 MG tablet Take 0.5 mg by mouth 3 (three) times daily.      . divalproex (DEPAKOTE) 500 MG DR tablet Take 750-1,000 mg by mouth 3 (three) times daily. Take 2 tablets at 0900 and 2100 and 1.5 tablets at 1300.      . furosemide (LASIX) 40 MG tablet Take 1 tablet (40 mg total) by mouth daily.      . isosorbide mononitrate (IMDUR) 60 MG 24 hr tablet Take 180 mg by mouth daily.      Marland Kitchen  levothyroxine (SYNTHROID, LEVOTHROID) 175 MCG tablet Take 175 mcg by mouth daily before breakfast.      . metoprolol succinate (TOPROL-XL) 50 MG 24 hr tablet Take 50 mg by mouth every morning. Take with or immediately following a meal.      . omeprazole (PRILOSEC) 40 MG capsule Take 40 mg by mouth daily.      . pregabalin (LYRICA) 100 MG capsule Take 100 mg by mouth 3 (three) times daily.      Marland Kitchen Propylene Glycol (SYSTANE BALANCE) 0.6 % SOLN Place 1 drop into both eyes at bedtime.       . Rivaroxaban (XARELTO) 20 MG TABS tablet Take 20 mg by mouth daily.       . rosuvastatin (CRESTOR) 20 MG tablet Take 20 mg by mouth daily.      . sertraline (ZOLOFT) 100 MG tablet  Take 200 mg by mouth daily.      Marland Kitchen zolpidem (AMBIEN) 5 MG tablet Take 2.5 mg by mouth at bedtime as needed for sleep. For insomnia       No current facility-administered medications on file prior to visit.        Objective:   Physical Exam  Filed Vitals:   02/13/14 1110  BP: 70/58  Pulse: 64  Temp: 98.1 F (36.7 C)  Height: 5\' 6"  (1.676 m)  Weight: 193 lb (87.544 kg)   Alert and oriented. Skin warm and dry. Oral mucosa is moist.   . Sclera anicteric, conjunctivae is pink. Thyroid not enlarged. No cervical lymphadenopathy. Lungs clear. Heart regular rate and rhythm.  Abdomen is soft. Bowel sounds are positive. No hepatomegaly. No abdominal masses felt. No tenderness.  No edema to lower extremities.  Stool brown and guaiac positive.       Assessment & Plan:  Weight loss, Early Satiety. Epigastric pain. Hx of colon cancer with rt hemicolectomy.  PUD needs to be ruled out. Colonic neoplasm needs to be ruled out.  EGD/Colonoscopy. CBC and Hepatic function

## 2014-02-13 NOTE — Patient Instructions (Signed)
EGD/Colonoscopy. The risks and benefits such as perforation, bleeding, and infection were reviewed with the patient and is agreeable. 

## 2014-02-13 NOTE — Telephone Encounter (Signed)
Patient needs trilyte 

## 2014-02-14 LAB — CBC WITH DIFFERENTIAL/PLATELET
BASOS ABS: 0 10*3/uL (ref 0.0–0.1)
Basophils Relative: 0 % (ref 0–1)
EOS ABS: 0.2 10*3/uL (ref 0.0–0.7)
EOS PCT: 2 % (ref 0–5)
HCT: 39.8 % (ref 39.0–52.0)
Hemoglobin: 13.6 g/dL (ref 13.0–17.0)
LYMPHS PCT: 22 % (ref 12–46)
Lymphs Abs: 2.1 10*3/uL (ref 0.7–4.0)
MCH: 30.7 pg (ref 26.0–34.0)
MCHC: 34.2 g/dL (ref 30.0–36.0)
MCV: 89.8 fL (ref 78.0–100.0)
Monocytes Absolute: 1.2 10*3/uL — ABNORMAL HIGH (ref 0.1–1.0)
Monocytes Relative: 13 % — ABNORMAL HIGH (ref 3–12)
Neutro Abs: 6 10*3/uL (ref 1.7–7.7)
Neutrophils Relative %: 63 % (ref 43–77)
PLATELETS: 271 10*3/uL (ref 150–400)
RBC: 4.43 MIL/uL (ref 4.22–5.81)
RDW: 14.8 % (ref 11.5–15.5)
WBC: 9.5 10*3/uL (ref 4.0–10.5)

## 2014-02-14 MED ORDER — PEG 3350-KCL-NA BICARB-NACL 420 G PO SOLR: 4000.0000 mL | Freq: Once | ORAL | Status: DC

## 2014-02-18 ENCOUNTER — Telehealth (INDEPENDENT_AMBULATORY_CARE_PROVIDER_SITE_OTHER): Payer: Self-pay | Admitting: Internal Medicine

## 2014-02-18 ENCOUNTER — Other Ambulatory Visit: Payer: Self-pay | Admitting: *Deleted

## 2014-02-18 MED ORDER — PREGABALIN 100 MG PO CAPS: ORAL_CAPSULE | ORAL | Status: AC

## 2014-02-18 NOTE — Telephone Encounter (Signed)
error 

## 2014-02-18 NOTE — Telephone Encounter (Signed)
[facility] 

## 2014-02-19 ENCOUNTER — Telehealth (INDEPENDENT_AMBULATORY_CARE_PROVIDER_SITE_OTHER): Payer: Self-pay | Admitting: Internal Medicine

## 2014-02-19 DIAGNOSIS — Z85038 Personal history of other malignant neoplasm of large intestine: Secondary | ICD-10-CM

## 2014-02-19 DIAGNOSIS — R634 Abnormal weight loss: Secondary | ICD-10-CM

## 2014-02-19 NOTE — Telephone Encounter (Signed)
Patient has weight loss and is scheduled for an EGD/Colonoscopy 03/08/2014. Needs a CT scan abdomen/pelvis

## 2014-02-20 NOTE — Telephone Encounter (Signed)
CT sch'd 02/25/13 at 145 (130), pick up contrast

## 2014-02-25 ENCOUNTER — Other Ambulatory Visit (HOSPITAL_COMMUNITY): Payer: PRIVATE HEALTH INSURANCE

## 2014-02-26 ENCOUNTER — Other Ambulatory Visit (HOSPITAL_COMMUNITY): Payer: Self-pay | Admitting: Internal Medicine

## 2014-02-26 DIAGNOSIS — R0789 Other chest pain: Secondary | ICD-10-CM

## 2014-02-28 ENCOUNTER — Ambulatory Visit (HOSPITAL_COMMUNITY)
Admission: RE | Admit: 2014-02-28 | Discharge: 2014-02-28 | Disposition: A | Discharge status: Home / Self Care | Payer: PRIVATE HEALTH INSURANCE | Source: Ambulatory Visit | Attending: Internal Medicine | Admitting: Internal Medicine

## 2014-02-28 ENCOUNTER — Encounter (HOSPITAL_COMMUNITY): Payer: Self-pay

## 2014-02-28 DIAGNOSIS — R634 Abnormal weight loss: Secondary | ICD-10-CM | POA: Insufficient documentation

## 2014-02-28 DIAGNOSIS — R911 Solitary pulmonary nodule: Secondary | ICD-10-CM | POA: Insufficient documentation

## 2014-02-28 DIAGNOSIS — R079 Chest pain, unspecified: Secondary | ICD-10-CM | POA: Insufficient documentation

## 2014-02-28 DIAGNOSIS — R1013 Epigastric pain: Secondary | ICD-10-CM | POA: Insufficient documentation

## 2014-02-28 DIAGNOSIS — R934 Abnormal findings on diagnostic imaging of urinary organs: Secondary | ICD-10-CM | POA: Insufficient documentation

## 2014-02-28 DIAGNOSIS — Z9049 Acquired absence of other specified parts of digestive tract: Secondary | ICD-10-CM | POA: Insufficient documentation

## 2014-02-28 DIAGNOSIS — R630 Anorexia: Secondary | ICD-10-CM | POA: Diagnosis not present

## 2014-02-28 DIAGNOSIS — R0789 Other chest pain: Secondary | ICD-10-CM

## 2014-02-28 DIAGNOSIS — Z85038 Personal history of other malignant neoplasm of large intestine: Secondary | ICD-10-CM

## 2014-02-28 LAB — POCT I-STAT CREATININE: Creatinine, Ser: 0.8 mg/dL (ref 0.50–1.35)

## 2014-02-28 MED ORDER — SODIUM CHLORIDE 0.9 % IJ SOLN
INTRAMUSCULAR | Status: DC
Start: 2014-02-28 — End: 2014-03-01
  Filled 2014-02-28: qty 36

## 2014-02-28 MED ORDER — IOHEXOL 300 MG/ML  SOLN
100.0000 mL | Freq: Once | INTRAMUSCULAR | Status: AC | PRN
  Administered 2014-02-28: 100 mL via INTRAVENOUS

## 2014-02-28 MED ORDER — SODIUM CHLORIDE 0.9 % IJ SOLN
INTRAMUSCULAR | Status: DC
Start: 2014-02-28 — End: 2014-03-01
  Filled 2014-02-28: qty 600

## 2014-03-08 ENCOUNTER — Encounter (HOSPITAL_COMMUNITY): Admission: RE | Payer: Self-pay | Source: Ambulatory Visit

## 2014-03-08 ENCOUNTER — Ambulatory Visit (HOSPITAL_COMMUNITY)
Admission: RE | Admit: 2014-03-08 | Payer: PRIVATE HEALTH INSURANCE | Source: Ambulatory Visit | Admitting: Internal Medicine

## 2014-03-08 SURGERY — COLONOSCOPY
Anesthesia: Moderate Sedation

## 2014-03-18 ENCOUNTER — Emergency Department (HOSPITAL_COMMUNITY): Payer: PRIVATE HEALTH INSURANCE

## 2014-03-18 ENCOUNTER — Inpatient Hospital Stay (HOSPITAL_COMMUNITY)
Admission: EM | Admit: 2014-03-18 | Discharge: 2014-03-23 | DRG: 291 | Disposition: A | Discharge status: Skilled Nursing Facility | Payer: PRIVATE HEALTH INSURANCE | Attending: Internal Medicine | Admitting: Internal Medicine

## 2014-03-18 ENCOUNTER — Encounter (HOSPITAL_COMMUNITY): Payer: Self-pay | Admitting: Emergency Medicine

## 2014-03-18 DIAGNOSIS — Z7901 Long term (current) use of anticoagulants: Secondary | ICD-10-CM

## 2014-03-18 DIAGNOSIS — G9340 Encephalopathy, unspecified: Secondary | ICD-10-CM | POA: Diagnosis present

## 2014-03-18 DIAGNOSIS — K2970 Gastritis, unspecified, without bleeding: Secondary | ICD-10-CM | POA: Diagnosis present

## 2014-03-18 DIAGNOSIS — J9601 Acute respiratory failure with hypoxia: Secondary | ICD-10-CM | POA: Diagnosis present

## 2014-03-18 DIAGNOSIS — Z951 Presence of aortocoronary bypass graft: Secondary | ICD-10-CM

## 2014-03-18 DIAGNOSIS — Z87891 Personal history of nicotine dependence: Secondary | ICD-10-CM

## 2014-03-18 DIAGNOSIS — E785 Hyperlipidemia, unspecified: Secondary | ICD-10-CM | POA: Diagnosis present

## 2014-03-18 DIAGNOSIS — I5031 Acute diastolic (congestive) heart failure: Principal | ICD-10-CM | POA: Diagnosis present

## 2014-03-18 DIAGNOSIS — E119 Type 2 diabetes mellitus without complications: Secondary | ICD-10-CM | POA: Diagnosis present

## 2014-03-18 DIAGNOSIS — R079 Chest pain, unspecified: Secondary | ICD-10-CM

## 2014-03-18 DIAGNOSIS — Z833 Family history of diabetes mellitus: Secondary | ICD-10-CM

## 2014-03-18 DIAGNOSIS — Z8673 Personal history of transient ischemic attack (TIA), and cerebral infarction without residual deficits: Secondary | ICD-10-CM

## 2014-03-18 DIAGNOSIS — K219 Gastro-esophageal reflux disease without esophagitis: Secondary | ICD-10-CM | POA: Diagnosis present

## 2014-03-18 DIAGNOSIS — Z9049 Acquired absence of other specified parts of digestive tract: Secondary | ICD-10-CM | POA: Diagnosis present

## 2014-03-18 DIAGNOSIS — I252 Old myocardial infarction: Secondary | ICD-10-CM

## 2014-03-18 DIAGNOSIS — G4733 Obstructive sleep apnea (adult) (pediatric): Secondary | ICD-10-CM | POA: Diagnosis present

## 2014-03-18 DIAGNOSIS — R0602 Shortness of breath: Secondary | ICD-10-CM | POA: Diagnosis not present

## 2014-03-18 DIAGNOSIS — R0902 Hypoxemia: Secondary | ICD-10-CM

## 2014-03-18 DIAGNOSIS — E782 Mixed hyperlipidemia: Secondary | ICD-10-CM | POA: Diagnosis present

## 2014-03-18 DIAGNOSIS — I480 Paroxysmal atrial fibrillation: Secondary | ICD-10-CM | POA: Diagnosis present

## 2014-03-18 DIAGNOSIS — I10 Essential (primary) hypertension: Secondary | ICD-10-CM | POA: Diagnosis present

## 2014-03-18 DIAGNOSIS — Z79899 Other long term (current) drug therapy: Secondary | ICD-10-CM

## 2014-03-18 DIAGNOSIS — I739 Peripheral vascular disease, unspecified: Secondary | ICD-10-CM | POA: Diagnosis present

## 2014-03-18 DIAGNOSIS — I2510 Atherosclerotic heart disease of native coronary artery without angina pectoris: Secondary | ICD-10-CM | POA: Diagnosis present

## 2014-03-18 DIAGNOSIS — Z85038 Personal history of other malignant neoplasm of large intestine: Secondary | ICD-10-CM

## 2014-03-18 DIAGNOSIS — K259 Gastric ulcer, unspecified as acute or chronic, without hemorrhage or perforation: Secondary | ICD-10-CM | POA: Diagnosis present

## 2014-03-18 DIAGNOSIS — G40909 Epilepsy, unspecified, not intractable, without status epilepticus: Secondary | ICD-10-CM | POA: Diagnosis present

## 2014-03-18 DIAGNOSIS — I509 Heart failure, unspecified: Secondary | ICD-10-CM

## 2014-03-18 MED ORDER — FUROSEMIDE 10 MG/ML IJ SOLN
80.0000 mg | Freq: Once | INTRAMUSCULAR | Status: AC
  Administered 2014-03-18: 80 mg via INTRAVENOUS
  Filled 2014-03-18: qty 8

## 2014-03-18 NOTE — ED Notes (Addendum)
O2 at 83% on room air. Pt on 2L O2 via nasal cannula by EMS. Pt breathing through mouth. Cannula placed in mouth on 6L O2. And sats came up to 91%.

## 2014-03-18 NOTE — ED Notes (Signed)
Per EMS: pt reporting weakness for a few days. Pt states he has been feeling weak. Also c/o intermittent chronic chest pain.

## 2014-03-18 NOTE — ED Provider Notes (Signed)
CSN: [id number]     Arrival date & time 03/18/14  2219 History  This chart was scribe for Justin Blade, MD by Judithann Sauger, ED Scribe. The patient was seen in room APA04/APA04 and the patient's care was started at 11:26 PM.    Chief Complaint  Patient presents with  . Weakness   The history is provided by the patient and the nursing home. No language interpreter was used.   HPI Comments: Level 5 Caveat  [name] is a 68 y.o. male who presents to the Emergency Department complaining of nothing.  According to notes from his nursing home.  He has "vague symptoms, and trouble swallowing."  EMS found him to be hypoxic at 83% on room air, place him on oxygen by nasal cannula.  This improved his saturation.  Level V Caveat- Dementia  Past Medical History  Diagnosis Date  . PVD (peripheral vascular disease)     Severe lower extremitiy  . Type 2 diabetes mellitus   . Mixed hyperlipidemia   . Obesity   . Seizure disorder   . OSA (obstructive sleep apnea)   . CVA (cerebrovascular accident) 2004    Persistent residual expressive aphasia  . Coronary atherosclerosis of native coronary artery     Multivessel  . Chest discomfort     Chronic on narcotics - Dr. Woody Seller  . Cervical spine disease   . Iron deficiency anemia     Managed by Dr. Marin Olp  . PAF (paroxysmal atrial fibrillation)   . Carotid artery disease   . GERD (gastroesophageal reflux disease)   . Myocardial infarction   . Colon cancer 2007   Past Surgical History  Procedure Laterality Date  . Carotid endarterectomy      Left  . Partial colectomy      2007  . Coronary artery bypass graft  05/2006    LIMA to diagonal, RIMA to LAD, radial to OM, radial to PDA  . Left shoulder surgery    . Cholecystectomy    . Femoral artery - popliteal artery bypass graft      Bilateral  . Tonsillectomy and adenoidectomy     Family History  Problem Relation Age of Onset  . Diabetes Mother    History  Substance Use Topics  .  Smoking status: Former Smoker -- 1.00 packs/day for 25 years    Types: Cigarettes    Quit date: 10/14/1990  . Smokeless tobacco: Never Used  . Alcohol Use: No    Review of Systems  Unable to perform ROS: Dementia      Allergies  Morphine and related and Sulfate  Home Medications   Prior to Admission medications   Medication Sig Start Date End Date Taking? Authorizing Provider  ALPRAZolam Duanne Moron) 0.5 MG tablet Take 0.5 mg by mouth 3 (three) times daily.    Historical Provider, MD  amLODipine (NORVASC) 2.5 MG tablet Take 2.5 mg by mouth daily.    Historical Provider, MD  divalproex (DEPAKOTE) 500 MG DR tablet Take 750-1,000 mg by mouth 3 (three) times daily. Take 2 tablets at 0900 and 2100 and 1.5 tablets at 1300.    Historical Provider, MD  furosemide (LASIX) 40 MG tablet Take 1 tablet (40 mg total) by mouth daily. 01/21/12   Donney Dice, PA-C  glipiZIDE (GLUCOTROL) 5 MG tablet Take by mouth daily before breakfast.    Historical Provider, MD  isosorbide mononitrate (IMDUR) 60 MG 24 hr tablet Take 180 mg by mouth daily.  Historical Provider, MD  levothyroxine (SYNTHROID, LEVOTHROID) 175 MCG tablet Take 175 mcg by mouth daily before breakfast.    Historical Provider, MD  loratadine (CLARITIN) 10 MG tablet Take 10 mg by mouth daily.    Historical Provider, MD  metoprolol succinate (TOPROL-XL) 50 MG 24 hr tablet Take 50 mg by mouth every morning. Take with or immediately following a meal.    Historical Provider, MD  nitroGLYCERIN (NITROSTAT) 0.4 MG SL tablet Place 0.4 mg under the tongue every 5 (five) minutes as needed for chest pain.    Historical Provider, MD  omeprazole (PRILOSEC) 40 MG capsule Take 40 mg by mouth daily.    Historical Provider, MD  oxyCODONE (OXY IR/ROXICODONE) 5 MG immediate release tablet Take 7.5 mg by mouth every 4 (four) hours as needed for severe pain.    Historical Provider, MD  polyethylene glycol (MIRALAX / GLYCOLAX) packet Take 17 g by mouth daily.     Historical Provider, MD  polyethylene glycol-electrolytes (TRILYTE) 420 G solution Take 4,000 mLs by mouth once. 02/14/14   Butch Penny, NP  pregabalin (LYRICA) 100 MG capsule Take one capsule by mouth every eight hours for neuropathic pain R/T DM 02/18/14   Tiffany L Reed, DO  Propylene Glycol (SYSTANE BALANCE) 0.6 % SOLN Place 1 drop into both eyes at bedtime.     Historical Provider, MD  Rivaroxaban (XARELTO) 20 MG TABS tablet Take 20 mg by mouth daily.     Historical Provider, MD  rosuvastatin (CRESTOR) 20 MG tablet Take 20 mg by mouth daily.    Historical Provider, MD  sertraline (ZOLOFT) 100 MG tablet Take 200 mg by mouth daily.    Historical Provider, MD  Suvorexant 5 MG TABS Take 1 tablet by mouth daily.     Historical Provider, MD  zolpidem (AMBIEN) 5 MG tablet Take 2.5 mg by mouth at bedtime as needed for sleep. For insomnia    Historical Provider, MD   BP 106/67 mmHg  Pulse 72  Temp(Src) 97.6 F (36.4 C) (Rectal)  Resp 17  SpO2 91% Physical Exam  Constitutional: He appears well-developed.  Elderly, frail  HENT:  Head: Normocephalic and atraumatic.  Right Ear: External ear normal.  Left Ear: External ear normal.  Mucus membrane is dry  Eyes: Conjunctivae and EOM are normal. Pupils are equal, round, and reactive to light.  Neck: Normal range of motion and phonation normal. Neck supple.  Cardiovascular: Normal rate, regular rhythm and normal heart sounds.   BP: 96/68 96% on 4 Liters of [place] No JVD  Pulmonary/Chest: Effort normal. No respiratory distress. He has no wheezes. He exhibits no tenderness and no bony tenderness.  Dull to percussion lungs  Abdominal: Soft. There is no tenderness.  Musculoskeletal: Normal range of motion. He exhibits no edema or tenderness.  Neurological: He is alert. No cranial nerve deficit or sensory deficit. He exhibits normal muscle tone. Coordination normal.  Skin: Skin is warm, dry and intact.  Psychiatric: He has a normal mood and affect.  His behavior is normal.  Nursing note and vitals reviewed.   ED Course  Procedures (including critical care time) Medications  furosemide (LASIX) injection 80 mg (not administered)    Patient Vitals for the past 24 hrs:  BP Temp Temp src Pulse Resp SpO2  03/18/14 2232 106/67 mmHg 97.6 F (36.4 C) Rectal 72 17 91 %  03/18/14 2230 106/67 mmHg - - 73 17 (!) 89 %     12:46 AM Reevaluation. After initial assessment  and treatment, an updated evaluation reveals he is resting comfortably.  Oxygen saturation 93% on 4 L nasal cannula oxygen.  He has not voided yet.  After receiving IV Lasix. Hunt Zajicek L   12:47 AM-Consult complete with Dr. Ernestina Patches. Patient case explained and discussed. He agrees to admit patient for further evaluation and treatment. Call ended at [place]: 11:33 PM- Pt advised of plan for treatment and pt agrees.    Labs Review Labs Reviewed  CBC WITH DIFFERENTIAL  BASIC METABOLIC PANEL  TROPONIN I  PRO B NATRIURETIC PEPTIDE   CRITICAL CARE Performed by: [name] Total critical care time: 35 minutes Critical care time was exclusive of separately billable procedures and treating other patients. Critical care was necessary to treat or prevent imminent or life-threatening deterioration. Critical care was time spent personally by me on the following activities: development of treatment plan with patient and/or surrogate as well as nursing, discussions with consultants, evaluation of patient's response to treatment, examination of patient, obtaining history from patient or surrogate, ordering and performing treatments and interventions, ordering and review of laboratory studies, ordering and review of radiographic studies, pulse oximetry and re-evaluation of patient's condition.  Imaging Review Dg Chest Portable 1 View  03/18/2014   CLINICAL DATA:  Chest pain. History of colon cancer, CVA, coronary artery disease and CABG. Initial encounter.  EXAM:  PORTABLE CHEST - 1 VIEW  COMPARISON:  11/17/2013  FINDINGS: Cardiomegaly, pulmonary vascular congestion and moderate pulmonary edema noted.  Bibasilar atelectasis present.  There is no evidence of pneumothorax.  Elevation the right hemidiaphragm again noted.  Median sternotomy wires again identified.  IMPRESSION: Cardiomegaly with moderate pulmonary edema   Electronically Signed   By: Hassan Rowan M.D.   On: 03/18/2014 23:00     EKG Interpretation   Date/Time:  Monday March 18 2014 22:22:56 EST Ventricular Rate:  74 PR Interval:  160 QRS Duration: 101 QT Interval:  436 QTC Calculation: 484 R Axis:   54 Text Interpretation:  Sinus rhythm Borderline T wave abnormalities ST  elevation, consider inferior injury Borderline prolonged QT interval since  last tracing no significant change Confirmed by Eulis Foster  MD, Pearlena Ow ([id number])  on 03/18/2014 11:59:59 PM      MDM   Final diagnoses:  Chest pain  Acute congestive heart failure, unspecified congestive heart failure type  Hypoxia   *Please Note: the patient DOES NOT have Chest Pain. This was entered by nursing when the CXR was ordered.  Hypoxia with congestive heart failure.  Last cardiac echo 2013 with normal EF, 60-65%.  He has a history of atrial fibrillation but is in sinus rhythm, today.  Blood pressure is soft, and will require close monitoring of fluid status, as he undergoes diuresis.  He will require admission to a hospital bed, with telemetry for monitoring.  Oxygenation is stable with [place] supplementation   Nursing Notes Reviewed/ Care Coordinated Applicable Imaging Reviewed Interpretation of Laboratory Data incorporated into ED treatment     I personally performed the services described in this documentation, which was scribed in my presence. The recorded information has been reviewed and is accurate.    Justin Blade, MD 03/19/14 [phone]

## 2014-03-19 ENCOUNTER — Observation Stay (HOSPITAL_COMMUNITY): Payer: PRIVATE HEALTH INSURANCE

## 2014-03-19 DIAGNOSIS — I10 Essential (primary) hypertension: Secondary | ICD-10-CM

## 2014-03-19 DIAGNOSIS — J9601 Acute respiratory failure with hypoxia: Secondary | ICD-10-CM | POA: Diagnosis present

## 2014-03-19 DIAGNOSIS — G9340 Encephalopathy, unspecified: Secondary | ICD-10-CM

## 2014-03-19 DIAGNOSIS — I5031 Acute diastolic (congestive) heart failure: Principal | ICD-10-CM

## 2014-03-19 DIAGNOSIS — I517 Cardiomegaly: Secondary | ICD-10-CM

## 2014-03-19 LAB — COMPREHENSIVE METABOLIC PANEL
ALT: 5 U/L (ref 0–53)
AST: 13 U/L (ref 0–37)
Albumin: 2.4 g/dL — ABNORMAL LOW (ref 3.5–5.2)
Alkaline Phosphatase: 45 U/L (ref 39–117)
Anion gap: 15 (ref 5–15)
BUN: 32 mg/dL — ABNORMAL HIGH (ref 6–23)
CO2: 25 meq/L (ref 19–32)
Calcium: 8.8 mg/dL (ref 8.4–10.5)
Chloride: 96 mEq/L (ref 96–112)
Creatinine, Ser: 0.88 mg/dL (ref 0.50–1.35)
GFR, EST NON AFRICAN AMERICAN: 86 mL/min — AB (ref >90)
Glucose, Bld: 111 mg/dL — ABNORMAL HIGH (ref 70–99)
POTASSIUM: 4.6 meq/L (ref 3.7–5.3)
SODIUM: 136 meq/L — AB (ref 137–147)
Total Bilirubin: 0.4 mg/dL (ref 0.3–1.2)
Total Protein: 7.1 g/dL (ref 6.0–8.3)

## 2014-03-19 LAB — PRO B NATRIURETIC PEPTIDE: PRO B NATRI PEPTIDE: 572.2 pg/mL — AB (ref 0.0–100.0)

## 2014-03-19 LAB — BASIC METABOLIC PANEL
Anion gap: 13 (ref 5–15)
BUN: 33 mg/dL — AB (ref 6–23)
CHLORIDE: 95 meq/L — AB (ref 96–112)
CO2: 28 meq/L (ref 19–32)
CREATININE: 0.88 mg/dL (ref 0.50–1.35)
Calcium: 9.2 mg/dL (ref 8.4–10.5)
Glucose, Bld: 147 mg/dL — ABNORMAL HIGH (ref 70–99)
Potassium: 4.5 mEq/L (ref 3.7–5.3)
Sodium: 136 mEq/L — ABNORMAL LOW (ref 137–147)

## 2014-03-19 LAB — BLOOD GAS, ARTERIAL
Acid-Base Excess: 3.9 mmol/L — ABNORMAL HIGH (ref 0.0–2.0)
Bicarbonate: 28.9 mEq/L — ABNORMAL HIGH (ref 20.0–24.0)
DRAWN BY: 38235
FIO2: 0.36 %
O2 Saturation: 88.3 %
PCO2 ART: 51.4 mmHg — AB (ref 35.0–45.0)
PH ART: 7.368 (ref 7.350–7.450)
Patient temperature: 37
TCO2: 25.6 mmol/L (ref 0–100)
pO2, Arterial: 61.1 mmHg — ABNORMAL LOW (ref 80.0–100.0)

## 2014-03-19 LAB — CBC WITH DIFFERENTIAL/PLATELET
Band Neutrophils: 0 % (ref 0–10)
Basophils Absolute: 0 10*3/uL (ref 0.0–0.1)
Basophils Absolute: 0 10*3/uL (ref 0.0–0.1)
Basophils Relative: 0 % (ref 0–1)
Basophils Relative: 0 % (ref 0–1)
Eosinophils Absolute: 0.3 10*3/uL (ref 0.0–0.7)
Eosinophils Absolute: 0.3 10*3/uL (ref 0.0–0.7)
Eosinophils Relative: 3 % (ref 0–5)
Eosinophils Relative: 3 % (ref 0–5)
HCT: 45.6 % (ref 39.0–52.0)
HEMATOCRIT: 46.7 % (ref 39.0–52.0)
HEMOGLOBIN: 15.6 g/dL (ref 13.0–17.0)
Hemoglobin: 14.8 g/dL (ref 13.0–17.0)
LYMPHS ABS: 3 10*3/uL (ref 0.7–4.0)
LYMPHS PCT: 31 % (ref 12–46)
Lymphocytes Relative: 30 % (ref 12–46)
Lymphs Abs: 3 10*3/uL (ref 0.7–4.0)
MCH: 31.3 pg (ref 26.0–34.0)
MCH: 30.4 pg (ref 26.0–34.0)
MCHC: 33.4 g/dL (ref 30.0–36.0)
MCHC: 32.5 g/dL (ref 30.0–36.0)
MCV: 93.6 fL (ref 78.0–100.0)
MCV: 93.6 fL (ref 78.0–100.0)
MONO ABS: 1.4 10*3/uL — AB (ref 0.1–1.0)
MONOS PCT: 15 % — AB (ref 3–12)
Monocytes Absolute: 1.7 10*3/uL — ABNORMAL HIGH (ref 0.1–1.0)
Monocytes Relative: 17 % — ABNORMAL HIGH (ref 3–12)
NEUTROS ABS: 5 10*3/uL (ref 1.7–7.7)
NEUTROS PCT: 50 % (ref 43–77)
Neutro Abs: 5 10*3/uL (ref 1.7–7.7)
Neutrophils Relative %: 52 % (ref 43–77)
PLATELETS: 100 10*3/uL — AB (ref 150–400)
Platelets: 120 10*3/uL — ABNORMAL LOW (ref 150–400)
RBC: 4.99 MIL/uL (ref 4.22–5.81)
RBC: 4.87 MIL/uL (ref 4.22–5.81)
RDW: 16.2 % — ABNORMAL HIGH (ref 11.5–15.5)
RDW: 16.1 % — ABNORMAL HIGH (ref 11.5–15.5)
WBC: 9.5 10*3/uL (ref 4.0–10.5)
WBC: 10.1 10*3/uL (ref 4.0–10.5)

## 2014-03-19 LAB — TROPONIN I
Troponin I: <0.3 ng/mL (ref <0.30)
Troponin I: <0.3 ng/mL (ref <0.30)
Troponin I: <0.3 ng/mL (ref <0.30)

## 2014-03-19 LAB — GLUCOSE, CAPILLARY
Glucose-Capillary: 102 mg/dL — ABNORMAL HIGH (ref 70–99)
Glucose-Capillary: 109 mg/dL — ABNORMAL HIGH (ref 70–99)

## 2014-03-19 LAB — URINALYSIS, ROUTINE W REFLEX MICROSCOPIC
BILIRUBIN URINE: NEGATIVE
Glucose, UA: NEGATIVE mg/dL
Hgb urine dipstick: NEGATIVE
Ketones, ur: NEGATIVE mg/dL
Leukocytes, UA: NEGATIVE
NITRITE: NEGATIVE
Protein, ur: NEGATIVE mg/dL
Specific Gravity, Urine: 1.01 (ref 1.005–1.030)
UROBILINOGEN UA: 0.2 mg/dL (ref 0.0–1.0)
pH: 5.5 (ref 5.0–8.0)

## 2014-03-19 LAB — D-DIMER, QUANTITATIVE: D-Dimer, Quant: 0.82 ug/mL-FEU — ABNORMAL HIGH (ref 0.00–0.48)

## 2014-03-19 LAB — HEMOGLOBIN A1C
Hgb A1c MFr Bld: 6.9 % — ABNORMAL HIGH (ref <5.7)
Mean Plasma Glucose: 151 mg/dL — ABNORMAL HIGH (ref <117)

## 2014-03-19 LAB — MRSA PCR SCREENING: MRSA BY PCR: POSITIVE — AB

## 2014-03-19 LAB — AMMONIA: AMMONIA: 35 umol/L (ref 11–60)

## 2014-03-19 MED ORDER — PREGABALIN 50 MG PO CAPS
100.0000 mg | ORAL_CAPSULE | Freq: Three times a day (TID) | ORAL | Status: DC
  Administered 2014-03-19 – 2014-03-23 (×10): 100 mg via ORAL
  Filled 2014-03-19 (×3): qty 2
  Filled 2014-03-19 (×2): qty 1
  Filled 2014-03-19 (×8): qty 2

## 2014-03-19 MED ORDER — SODIUM CHLORIDE 0.9 % IJ SOLN
3.0000 mL | Freq: Two times a day (BID) | INTRAMUSCULAR | Status: DC
  Administered 2014-03-19 – 2014-03-23 (×10): 3 mL via INTRAVENOUS

## 2014-03-19 MED ORDER — DIVALPROEX SODIUM 250 MG PO DR TAB
1000.0000 mg | DELAYED_RELEASE_TABLET | ORAL | Status: DC
  Administered 2014-03-19 – 2014-03-23 (×8): 1000 mg via ORAL
  Filled 2014-03-19 (×9): qty 4

## 2014-03-19 MED ORDER — RIVAROXABAN 20 MG PO TABS
20.0000 mg | ORAL_TABLET | Freq: Every day | ORAL | Status: DC
  Administered 2014-03-20: 20 mg via ORAL
  Filled 2014-03-19 (×2): qty 1

## 2014-03-19 MED ORDER — CHLORHEXIDINE GLUCONATE CLOTH 2 % EX PADS
6.0000 | MEDICATED_PAD | Freq: Every day | CUTANEOUS | Status: AC
  Administered 2014-03-19 – 2014-03-23 (×5): 6 via TOPICAL

## 2014-03-19 MED ORDER — SERTRALINE HCL 50 MG PO TABS
200.0000 mg | ORAL_TABLET | Freq: Every day | ORAL | Status: DC
  Administered 2014-03-20 – 2014-03-23 (×4): 200 mg via ORAL
  Filled 2014-03-19 (×5): qty 4

## 2014-03-19 MED ORDER — ZOLPIDEM TARTRATE 5 MG PO TABS: 2.5000 mg | ORAL_TABLET | Freq: Every evening | ORAL | Status: DC | PRN

## 2014-03-19 MED ORDER — DIVALPROEX SODIUM 250 MG PO DR TAB
750.0000 mg | DELAYED_RELEASE_TABLET | Freq: Three times a day (TID) | ORAL | Status: DC
Start: 2014-03-19 — End: 2014-03-19

## 2014-03-19 MED ORDER — NITROGLYCERIN 0.4 MG SL SUBL
0.4000 mg | SUBLINGUAL_TABLET | SUBLINGUAL | Status: DC | PRN
Start: 2014-03-19 — End: 2014-03-23

## 2014-03-19 MED ORDER — LEVOTHYROXINE SODIUM 175 MCG PO TABS
175.0000 ug | ORAL_TABLET | Freq: Every day | ORAL | Status: DC
  Filled 2014-03-19 (×2): qty 1

## 2014-03-19 MED ORDER — MUPIROCIN 2 % EX OINT
1.0000 "application " | TOPICAL_OINTMENT | Freq: Two times a day (BID) | CUTANEOUS | Status: DC
  Administered 2014-03-19 – 2014-03-23 (×9): 1 via NASAL
  Filled 2014-03-19: qty 22

## 2014-03-19 MED ORDER — CETYLPYRIDINIUM CHLORIDE 0.05 % MT LIQD
7.0000 mL | Freq: Two times a day (BID) | OROMUCOSAL | Status: DC
  Administered 2014-03-19 – 2014-03-23 (×8): 7 mL via OROMUCOSAL

## 2014-03-19 MED ORDER — SUVOREXANT 5 MG PO TABS: 1.0000 | ORAL_TABLET | Freq: Every day | ORAL | Status: DC

## 2014-03-19 MED ORDER — LEVOTHYROXINE SODIUM 75 MCG PO TABS
175.0000 ug | ORAL_TABLET | Freq: Every day | ORAL | Status: DC
  Administered 2014-03-20 – 2014-03-23 (×4): 175 ug via ORAL
  Filled 2014-03-19 (×8): qty 1

## 2014-03-19 MED ORDER — DIVALPROEX SODIUM 250 MG PO DR TAB
750.0000 mg | DELAYED_RELEASE_TABLET | Freq: Every day | ORAL | Status: DC
  Administered 2014-03-20 – 2014-03-23 (×4): 750 mg via ORAL
  Filled 2014-03-19 (×4): qty 3

## 2014-03-19 MED ORDER — ROSUVASTATIN CALCIUM 20 MG PO TABS
20.0000 mg | ORAL_TABLET | Freq: Every day | ORAL | Status: DC
  Administered 2014-03-20 – 2014-03-23 (×4): 20 mg via ORAL
  Filled 2014-03-19 (×5): qty 1

## 2014-03-19 NOTE — Progress Notes (Signed)
Patient seen and evaluated earlier this AM by my associate. Please refer to his H and P for details regarding assessment and plan.   Will plan on reassessing next am or sooner should any new concerns arise.  Sholanda Croson, [employer]

## 2014-03-19 NOTE — Plan of Care (Signed)
Problem: ICU Phase Progression Outcomes Goal: O2 sats trending toward baseline Outcome: Progressing Pt sats =93% on 4liters [place] Goal: Dyspnea controlled at rest Outcome: Progressing Pt sleeping soundly w/o any SOB Goal: Hemodynamically stable Outcome: Progressing Vital signs are stable. BP=90/56   Hr 66 SR  Resp14 Goal: Pain controlled with appropriate interventions Outcome: Progressing Pt denies any pain Goal: Flu/PneumoVaccines if indicated Outcome: Progressing Pt has had infuenza vaccine.  Documentation from [facility] stated Pt was not eligible & thus needs clarification Goal: Voiding-avoid urinary catheter unless indicated Outcome: Not Progressing Pt received 80mg  lasix in ED & foley was inserted for I&O needs.

## 2014-03-19 NOTE — Care Management Note (Signed)
    Page 1 of 1   03/19/2014     1:26:23 PM CARE MANAGEMENT NOTE 03/19/2014  Patient:  Justin, Pacheco   Account Number:  [id number]  Date Initiated:  03/19/2014  Documentation initiated by:  Jolene Provost  Subjective/Objective Assessment:   Pt is SNF, [facility].     Action/Plan:   Plan to discharge back to SNF. CSW aware of placement needs. No CM needs identified.   Anticipated DC Date:  03/20/2014   Anticipated DC Plan:  SKILLED NURSING FACILITY  In-house referral  Clinical Social Worker      DC Planning Services  CM consult      Choice offered to / List presented to:             Status of service:  Completed, signed off Medicare Important Message given?   (If response is "NO", the following Medicare IM given date fields will be blank) Date Medicare IM given:   Medicare IM given by:   Date Additional Medicare IM given:   Additional Medicare IM given by:    Discharge Disposition:  [place]  Per UR Regulation:    If discussed at Long Length of Stay Meetings, dates discussed:    Comments:  03/19/2014 [place], RN, MSN, [facility]

## 2014-03-19 NOTE — Evaluation (Signed)
Clinical/Bedside Swallow Evaluation Patient Details  Name: [name] MRN: [id number] DOB: 30-Aug-1945  Today's Date: 03/19/2014 Time: 4:48 PM  -  5:22 PM    Past Medical History:  Past Medical History  Diagnosis Date  . PVD (peripheral vascular disease)     Severe lower extremitiy  . Type 2 diabetes mellitus   . Mixed hyperlipidemia   . Obesity   . Seizure disorder   . OSA (obstructive sleep apnea)   . CVA (cerebrovascular accident) 2004    Persistent residual expressive aphasia  . Coronary atherosclerosis of native coronary artery     Multivessel  . Chest discomfort     Chronic on narcotics - Dr. Woody Seller  . Cervical spine disease   . Iron deficiency anemia     Managed by Dr. Marin Olp  . PAF (paroxysmal atrial fibrillation)   . Carotid artery disease   . GERD (gastroesophageal reflux disease)   . Myocardial infarction   . Colon cancer 2007   Past Surgical History:  Past Surgical History  Procedure Laterality Date  . Carotid endarterectomy      Left  . Partial colectomy      2007  . Coronary artery bypass graft  05/2006    LIMA to diagonal, RIMA to LAD, radial to OM, radial to PDA  . Left shoulder surgery    . Cholecystectomy    . Femoral artery - popliteal artery bypass graft      Bilateral  . Tonsillectomy and adenoidectomy     HPI:  [name] is a 68 y.o. year old male with significant past medical history of afib on xarelto, PVD, CAD, HTN, type 2 DM, chronic chest pain presenting with weakness, acute resp failure w/ hypoxia, encephalopathy. Level V caveat as pt history is noncontributory. Pt resident of local SNF. Per report, pt w/ generalized weakness x 2 days. No reported falls. Pt noted w/ inability to swallow earlier today at SNF. Vitals check w/ noted O2 sats @ 83% on RA. Supplemental O2 placed and pt directed to ER for further evaluation. SLP asked to evaluate swallow at bedside due to history of dysphagia. Chart review reveals that pt is followed by Dr.  Laural Golden and Deberah Castle for epigastric pain and weight loss. He was supposed to have EGD on 03/08/2014, however this was cancelled. Pt's brother states that this was cancelled because pt had PNA. His brother also reports that pt has complained of epigastric pain for quite some time.    Assessment/Recommendations/Treatment Plan Suspected Esophageal Findings Suspected Esophageal Findings: Other (comment) (pt c/o epigastric pain at times)  SLP Assessment Clinical Impression Statement: Oral motor examination is unremarkable. Pt tolerated trials without incident, however when self presenting cup sips of ice water, pt with one episode of delayed cough. Pt with mild wet vocal quality. No coughing when pt took smaller sips; ok with straw. Recommend D3/mech soft and thin liquids with meds whole with water and MBSS tomorrow due to history of PNA, pt with subjective complaints of esophageal dysphagia, weight loss, and occasional coughing with po intake. Will proceed with MBSS tomorrow. Above to RN.  Risk for Aspiration: Mild Other Related Risk Factors: History of pneumonia, History of esophageal-related issues, Cognitive impairment  Swallow Evaluation Recommendations Recommended Consults: MBS Diet Recommendations: Dysphagia 3 (Mechanical Soft), Thin liquid Liquid Administration via: Cup, Straw Medication Administration: Whole meds with liquid Supervision: Patient able to self feed, Intermittent supervision to cue for compensatory strategies Compensations: Slow rate, Small sips/bites Postural Changes and/or  Swallow Maneuvers: Seated upright 90 degrees, Upright 30-60 min after meal Oral Care Recommendations: Oral care BID Other Recommendations: Clarify dietary restrictions Follow up Recommendations:  (back to [facility])  Treatment Plan Treatment Plan Recommendations: F/U MBS in ___ days (Comment) (Wednesday) Speech Therapy Frequency (ACUTE ONLY): min 2x/week Treatment Duration: 1  week Interventions: Aspiration precaution training, Patient/family education, Compensatory techniques, Diet toleration management by SLP  Prognosis Prognosis for Safe Diet Advancement: Good  Individuals Consulted Consulted and Agree with Results and Recommendations: Patient, Family Midwife, RN Family Member Consulted: Pt's brother  Swallowing Goals    Pt will demonstrate safe and efficient consumption of least restrictive diet with use of strategies as needed.  Swallow Study Prior Functional Status   Resident x 2 years at [facility]  Date of Onset: 03/18/14 HPI: [name] is a 68 y.o. year old male with significant past medical history of afib on xarelto, PVD, CAD, HTN, type 2 DM, chronic chest pain presenting with weakness, acute resp failure w/ hypoxia, encephalopathy. Level V caveat as pt history is noncontributory. Pt resident of local SNF. Per report, pt w/ generalized weakness x 2 days. No reported falls. Pt noted w/ inability to swallow earlier today at SNF. Vitals check w/ noted O2 sats @ 83% on RA. Supplemental O2 placed and pt directed to ER for further evaluation. SLP asked to evaluate swallow at bedside due to history of dysphagia. Chart review reveals that pt is followed by Dr. Laural Golden and Deberah Castle for epigastric pain and weight loss. He was supposed to have EGD on 03/08/2014, however this was cancelled. Pt's brother states that this was cancelled because pt had PNA. His brother also reports that pt has complained of epigastric pain for quite some time.  Type of Study: Bedside swallow evaluation Previous Swallow Assessment: None on record Diet Prior to this Study: NPO Temperature Spikes Noted: No Respiratory Status: Room air History of Recent Intubation: No Behavior/Cognition: Alert, Cooperative, Confused, Pleasant mood, Hard of hearing Oral Cavity - Dentition: Dentures, top Self-Feeding Abilities: Able to feed self (has tremor) Patient  Positioning: Upright in bed Baseline Vocal Quality: Clear, Wet Volitional Cough: Strong Volitional Swallow: Able to elicit  Oral Motor/Sensory Function  Overall Oral Motor/Sensory Function: Appears within functional limits for tasks assessed Labial ROM: Within Functional Limits Labial Symmetry: Within Functional Limits Labial Strength: Within Functional Limits Labial Sensation: Within Functional Limits Lingual ROM: Within Functional Limits Lingual Symmetry: Within Functional Limits Lingual Strength: Within Functional Limits Lingual Sensation: Within Functional Limits Facial ROM: Within Functional Limits Facial Symmetry: Within Functional Limits Facial Strength: Within Functional Limits Facial Sensation: Within Functional Limits Velum: Within Functional Limits Mandible: Within Functional Limits  Consistency Results  Ice Chips Ice chips: Within functional limits Presentation: Spoon  Thin Liquid Thin Liquid: Within functional limits Presentation: Cup;Self Fed;Straw Pharyngeal  Phase Impairments: Wet Vocal Quality;Cough - Delayed Other Comments: Pt with delayed cough x1 over 240 ml water  Nectar Thick Liquid Nectar Thick Liquid: Not tested  Honey Thick Liquid Honey Thick Liquid: Not tested  Puree Puree: Within functional limits Presentation: Spoon;Self Fed  Solid Solid: Within functional limits Presentation: Self Fed Other Comments: Pt stated that it didn't taste good (graham cracker)  G-Codes: clinical judgement; Swallowing; intial: CI, goal: [place]  Thank you,  Genene Churn, [place]  Bruce Mayers 03/19/2014,6:07 PM

## 2014-03-19 NOTE — Progress Notes (Signed)
UR completed 

## 2014-03-19 NOTE — H&P (Addendum)
Hospitalist Admission History and Physical  Patient name: [name] Medical record number: [id number] Date of birth: April 16, 1946 Age: 68 y.o. Gender: male  Primary Care Provider: Glenda Chroman., MD  Chief Complaint: acute resp failure w/ hypoxia, encephalopathy   History of Present Illness:This is a 68 y.o. year old male with significant past medical history of afib on xarelto, PVD, CAD, HTN, type 2 DM, chronic chest pain presenting with weakness, acute resp failure w/ hypoxia, encephalopathy. Level V caveat as pt history is noncontributory. Pt resident of local SNF. Per report, pt w/ generalized weakness x 2 days. No reported falls. Pt noted w/ inability to swallow earlier today at SNF. Vitals check w/ noted O2 sats @ 83% on RA. Supplemental O2 placed and pt directed to ER for further evaluation.  On presentation to the ER, temperature 97.6 heart rate in the 60s 70s, respirations and intense 20s, blood pressure in the 80s to 100s, satting 86% on room air, greater than 95% on 3-4 L. White blood cell count 9.5, hemoglobin 15.6, creatinine 0.88. Troponin negative 1. EKG sinus rhythm with borderline ST abnormality. Pro BNP 572. D-dimer 0.8. Chest x-ray indicative cardiomegaly with moderate pulmonary edema. Given IV Lasix in the ER.   Assessment and Plan: [name] is a 68 y.o. year old male presenting with acute resp failure w/ hypoxia, encephalopathy    Active Problems:   Acute respiratory failure with hypoxia   1- Acute resp failure w/ hypoxia  -cardioresp etiologies higher up on ddx -noted cardiomegaly on imaging w/ mildly elevated pro BNP though fairly euvolemic on exam   -s/p IV lasix in ER -follow UOP  -2D ECHO  -noted mildly elevated d dimer -on xarelto  -consider CTA if sxs persist despite diuresis  -cont supplemental O2  2- Encephalopathy  -confused on exam  -unclear if this is pt's baseline, though possible/likely  -ammonia  -UA  -head CT -follow  3- HTN -LLN  BP on presentation -hold oral regimen   4- CAD/Afib  -noted chronic chest pain  -cycle CEs -rate controlled -cont xarelto  -tele bed   5- Type 2 DM  -SSI  -A1C    FEN/GI: heart healthy diet pending bedside swallow eval  Prophylaxis: xarelto  Disposition: pending further evaluation  Code Status:Full Code    Patient Active Problem List   Diagnosis Date Noted  . Acute respiratory failure with hypoxia 03/19/2014  . Loss of weight 02/13/2014  . Early satiety 02/13/2014  . Guaiac positive stools 02/13/2014  . Epigastric abdominal tenderness 02/13/2014  . Encounter for therapeutic drug monitoring 05/11/2013  . Pain in limb 12/12/2012  . Numbness in right leg 07/18/2012  . Peripheral vascular disease, unspecified 07/18/2012  . Atherosclerosis of native arteries of the extremities with rest pain 07/18/2012  . Edema 06/30/2012  . Obstructive sleep apnea 02/15/2011  . Atrial fibrillation 02/15/2011  . Chest pain 12/24/2010  . Coronary atherosclerosis of native coronary artery 10/14/2010  . Essential hypertension, benign 10/14/2010  . Hyperlipidemia 10/14/2010  . NONSPECIFIC ABN FINDING RAD & OTH EXAM GI TRACT 01/24/2008   Past Medical History: Past Medical History  Diagnosis Date  . PVD (peripheral vascular disease)     Severe lower extremitiy  . Type 2 diabetes mellitus   . Mixed hyperlipidemia   . Obesity   . Seizure disorder   . OSA (obstructive sleep apnea)   . CVA (cerebrovascular accident) 2004    Persistent residual expressive aphasia  . Coronary atherosclerosis of native coronary  artery     Multivessel  . Chest discomfort     Chronic on narcotics - Dr. Woody Seller  . Cervical spine disease   . Iron deficiency anemia     Managed by Dr. Marin Olp  . PAF (paroxysmal atrial fibrillation)   . Carotid artery disease   . GERD (gastroesophageal reflux disease)   . Myocardial infarction   . Colon cancer 2007    Past Surgical History: Past Surgical History  Procedure  Laterality Date  . Carotid endarterectomy      Left  . Partial colectomy      2007  . Coronary artery bypass graft  05/2006    LIMA to diagonal, RIMA to LAD, radial to OM, radial to PDA  . Left shoulder surgery    . Cholecystectomy    . Femoral artery - popliteal artery bypass graft      Bilateral  . Tonsillectomy and adenoidectomy      Social History: History   Social History  . Marital Status: Widowed    Spouse Name: N/A    Number of Children: N/A  . Years of Education: N/A   Social History Main Topics  . Smoking status: Former Smoker -- 1.00 packs/day for 25 years    Types: Cigarettes    Quit date: 10/14/1990  . Smokeless tobacco: Never Used  . Alcohol Use: No  . Drug Use: No  . Sexual Activity: No   Other Topics Concern  . None   Social History Narrative    Family History: Family History  Problem Relation Age of Onset  . Diabetes Mother     Allergies: Allergies  Allergen Reactions  . Morphine And Related Other (See Comments)    Per MAR  . Sulfate     Current Facility-Administered Medications  Medication Dose Route Frequency Provider Last Rate Last Dose  . divalproex (DEPAKOTE) DR tablet 750-1,000 mg  750-1,000 mg Oral TID Shanda Howells, MD      . levothyroxine (SYNTHROID, LEVOTHROID) tablet 175 mcg  175 mcg Oral QAC breakfast Shanda Howells, MD      . nitroGLYCERIN (NITROSTAT) SL tablet 0.4 mg  0.4 mg Sublingual Q5 min PRN Shanda Howells, MD      . pregabalin (LYRICA) capsule 100 mg  100 mg Oral TID Shanda Howells, MD      . rivaroxaban Alveda Reasons) tablet 20 mg  20 mg Oral Daily Shanda Howells, MD      . rosuvastatin (CRESTOR) tablet 20 mg  20 mg Oral Daily Shanda Howells, MD      . sertraline (ZOLOFT) tablet 200 mg  200 mg Oral Daily Shanda Howells, MD      . sodium chloride 0.9 % injection 3 mL  3 mL Intravenous Q12H Shanda Howells, MD      . Suvorexant TABS 1 tablet  1 tablet Oral Daily Shanda Howells, MD      . zolpidem [facility]) tablet 2.5 mg  2.5 mg Oral  QHS PRN Shanda Howells, MD       Current Outpatient Prescriptions  Medication Sig Dispense Refill  . ALPRAZolam (XANAX) 0.5 MG tablet Take 0.5 mg by mouth 3 (three) times daily.    Marland Kitchen amLODipine (NORVASC) 2.5 MG tablet Take 2.5 mg by mouth daily.    . divalproex (DEPAKOTE) 500 MG DR tablet Take 750-1,000 mg by mouth 3 (three) times daily. Take 2 tablets at 0900 and 2100 and 1.5 tablets at 1300.    . furosemide (LASIX) 40 MG tablet Take 1 tablet (40  mg total) by mouth daily.    Marland Kitchen glipiZIDE (GLUCOTROL) 5 MG tablet Take by mouth daily before breakfast.    . isosorbide mononitrate (IMDUR) 60 MG 24 hr tablet Take 180 mg by mouth daily.    Marland Kitchen levothyroxine (SYNTHROID, LEVOTHROID) 175 MCG tablet Take 175 mcg by mouth daily before breakfast.    . loratadine (CLARITIN) 10 MG tablet Take 10 mg by mouth daily.    . metoprolol succinate (TOPROL-XL) 50 MG 24 hr tablet Take 50 mg by mouth every morning. Take with or immediately following a meal.    . nitroGLYCERIN (NITROSTAT) 0.4 MG SL tablet Place 0.4 mg under the tongue every 5 (five) minutes as needed for chest pain.    Marland Kitchen omeprazole (PRILOSEC) 40 MG capsule Take 40 mg by mouth daily.    Marland Kitchen oxyCODONE (OXY IR/ROXICODONE) 5 MG immediate release tablet Take 7.5 mg by mouth every 4 (four) hours as needed for severe pain.    . polyethylene glycol (MIRALAX / GLYCOLAX) packet Take 17 g by mouth daily.    . polyethylene glycol-electrolytes (TRILYTE) 420 G solution Take 4,000 mLs by mouth once. 4000 mL 0  . pregabalin (LYRICA) 100 MG capsule Take one capsule by mouth every eight hours for neuropathic pain R/T DM 90 capsule 5  . Propylene Glycol (SYSTANE BALANCE) 0.6 % SOLN Place 1 drop into both eyes at bedtime.     . Rivaroxaban (XARELTO) 20 MG TABS tablet Take 20 mg by mouth daily.     . rosuvastatin (CRESTOR) 20 MG tablet Take 20 mg by mouth daily.    . sertraline (ZOLOFT) 100 MG tablet Take 200 mg by mouth daily.    . Suvorexant 5 MG TABS Take 1 tablet by mouth  daily.     Marland Kitchen zolpidem (AMBIEN) 5 MG tablet Take 2.5 mg by mouth at bedtime as needed for sleep. For insomnia     Review Of Systems: 12 point ROS negative except as noted above in HPI.  Physical Exam: Filed Vitals:   03/19/14 0015  BP:   Pulse: 66  Temp:   Resp: 15    General: confused  HEENT: PERRLA and extra ocular movement intact Heart: S1, S2 normal, no murmur, rub or gallop, regular rate and rhythm Lungs: clear to auscultation, no wheezes or rales and unlabored breathing Abdomen: abdomen is soft without significant tenderness, masses, organomegaly or guarding Extremities: extremities normal, atraumatic, no cyanosis or edema Skin:no rashes Neurology: normal without focal findings  Labs and Imaging: Lab Results  Component Value Date/Time   NA 136* 03/18/2014 11:17 PM   K 4.5 03/18/2014 11:17 PM   CL 95* 03/18/2014 11:17 PM   CO2 28 03/18/2014 11:17 PM   BUN 33* 03/18/2014 11:17 PM   CREATININE 0.88 03/18/2014 11:17 PM   GLUCOSE 147* 03/18/2014 11:17 PM   Lab Results  Component Value Date   WBC 9.5 03/18/2014   HGB 15.6 03/18/2014   HCT 46.7 03/18/2014   MCV 93.6 03/18/2014   PLT 120* 03/18/2014    Dg Chest Portable 1 View  03/18/2014   CLINICAL DATA:  Chest pain. History of colon cancer, CVA, coronary artery disease and CABG. Initial encounter.  EXAM: PORTABLE CHEST - 1 VIEW  COMPARISON:  11/17/2013  FINDINGS: Cardiomegaly, pulmonary vascular congestion and moderate pulmonary edema noted.  Bibasilar atelectasis present.  There is no evidence of pneumothorax.  Elevation the right hemidiaphragm again noted.  Median sternotomy wires again identified.  IMPRESSION: Cardiomegaly with moderate pulmonary edema  Electronically Signed   By: Hassan Rowan M.D.   On: 03/18/2014 23:00           Shanda Howells MD  Pager: [phone]

## 2014-03-19 NOTE — Clinical Social Work Psychosocial (Signed)
Clinical Social Work Department BRIEF PSYCHOSOCIAL ASSESSMENT 03/19/2014  Patient:  [name], [name]     Account Number:  [id number]     Admit date:  03/18/2014  Clinical Social Worker:  Legrand Como  Date/Time:  03/19/2014 12:24 PM  Referred by:  CSW  Date Referred:  03/19/2014 Referred for  SNF Placement   Other Referral:   Interview type:  Patient Other interview type:   Patient  Brother, [place], [name]    PSYCHOSOCIAL DATA Living Status:  FACILITY Admitted from facility:  [facility] Level of care:  [place] Primary support name:  Darlen Round and Sharlyne Pacas Primary support relationship to patient:  [place] Degree of support available:   Siblings visit with patient.    CURRENT CONCERNS Current Concerns  Post-Acute Placement   Other Concerns:    SOCIAL WORK ASSESSMENT / PLAN CSW met with patient and found patient to be disoriented. Patient was only oriented to self at this time.  CSW spoke to patient's brother [name], he indicated that patient has been a resident of [facility] for a couple of years.  He stated that patient ambulates with a walker lately.  He stated that patient is typically not confused/disoriented.  Mr. Arredondo stated that he wanted patient to go back to [facility] upon discharge.  Mr. Hayashi indicated that he and patient's sister visit patient often.  CSW spoke with [name] at [facility].  She confirmed that patient's brother's statements. [name] stated that patient has been a resident at the facility since 09/2011.  She stated that at baseline, patient is not normally disoriented, however he does have mild intermittent confusion.  [name] indicated that patient is supposed to be a one person assist, but he gets up and walks without assistance sometimes She did confirm his recent use of a walker due to leg pain issues.  [name] indicated that patient's sister visits often in the evenings.  She indicated  that patient could come back to the facility upon discharge.   Assessment/plan status:   Other assessment/ plan:   Information/referral to community resources:    PATIENT'S/FAMILY'S RESPONSE TO PLAN OF CARE: Family is agreeable for patient to return to [facility] upon discharge.   Ambrose Pancoast, [place]

## 2014-03-19 NOTE — ED Notes (Signed)
Report give to [facility]

## 2014-03-19 NOTE — Progress Notes (Signed)
  CRITICAL VALUE ALERT  Critical value received:  MRSA positive  Date of notification: 03/19/2014  Time of notification:  9:56 AM  Critical value read back:Yes.    Nurse who received alert:  Eulis Canner schonewitz, rn   MD notified (1st page):  Left note for Dr. Wendee Beavers, initiated standing orders per protocol.  Time of first page:  03/19/2014  MD notified (2nd page):  Time of second page:  Responding MD:  Left note for Dr. Wendee Beavers  Time MD responded:  9:57 AM

## 2014-03-19 NOTE — Progress Notes (Signed)
  Echocardiogram 2D Echocardiogram has been performed.  [place], [place] 03/19/2014, 1:24 PM

## 2014-03-19 NOTE — Progress Notes (Signed)
[place] for immunization record regarding flu and pneumonia vaccines.  Kayla from [facility] stated he had both vaccinations on 02/07/14. Schonewitz, Eulis Canner 03/19/2014

## 2014-03-20 ENCOUNTER — Observation Stay (HOSPITAL_COMMUNITY): Payer: PRIVATE HEALTH INSURANCE

## 2014-03-20 DIAGNOSIS — I5031 Acute diastolic (congestive) heart failure: Secondary | ICD-10-CM

## 2014-03-20 DIAGNOSIS — R1310 Dysphagia, unspecified: Secondary | ICD-10-CM

## 2014-03-20 DIAGNOSIS — R634 Abnormal weight loss: Secondary | ICD-10-CM

## 2014-03-20 DIAGNOSIS — R10816 Epigastric abdominal tenderness: Secondary | ICD-10-CM

## 2014-03-20 LAB — CBC WITH DIFFERENTIAL/PLATELET
Basophils Absolute: 0 10*3/uL (ref 0.0–0.1)
Basophils Relative: 0 % (ref 0–1)
Eosinophils Absolute: 0.3 10*3/uL (ref 0.0–0.7)
Eosinophils Relative: 3 % (ref 0–5)
HCT: 47.2 % (ref 39.0–52.0)
HEMOGLOBIN: 15.4 g/dL (ref 13.0–17.0)
LYMPHS ABS: 3 10*3/uL (ref 0.7–4.0)
Lymphocytes Relative: 32 % (ref 12–46)
MCH: 30.3 pg (ref 26.0–34.0)
MCHC: 32.6 g/dL (ref 30.0–36.0)
MCV: 92.9 fL (ref 78.0–100.0)
Monocytes Absolute: 1.4 10*3/uL — ABNORMAL HIGH (ref 0.1–1.0)
Monocytes Relative: 15 % — ABNORMAL HIGH (ref 3–12)
NEUTROS ABS: 4.5 10*3/uL (ref 1.7–7.7)
NEUTROS PCT: 50 % (ref 43–77)
Platelets: 126 10*3/uL — ABNORMAL LOW (ref 150–400)
RBC: 5.08 MIL/uL (ref 4.22–5.81)
RDW: 15.9 % — ABNORMAL HIGH (ref 11.5–15.5)
WBC: 9.2 10*3/uL (ref 4.0–10.5)

## 2014-03-20 LAB — GLUCOSE, CAPILLARY
GLUCOSE-CAPILLARY: 102 mg/dL — AB (ref 70–99)
Glucose-Capillary: 94 mg/dL (ref 70–99)
Glucose-Capillary: 91 mg/dL (ref 70–99)
Glucose-Capillary: 121 mg/dL — ABNORMAL HIGH (ref 70–99)

## 2014-03-20 LAB — COMPREHENSIVE METABOLIC PANEL
ANION GAP: 10 (ref 5–15)
AST: 10 U/L (ref 0–37)
Albumin: 2.7 g/dL — ABNORMAL LOW (ref 3.5–5.2)
Alkaline Phosphatase: 53 U/L (ref 39–117)
BUN: 29 mg/dL — ABNORMAL HIGH (ref 6–23)
CALCIUM: 9.1 mg/dL (ref 8.4–10.5)
CO2: 31 mEq/L (ref 19–32)
Chloride: 97 mEq/L (ref 96–112)
Creatinine, Ser: 0.75 mg/dL (ref 0.50–1.35)
GFR calc non Af Amer: >90 mL/min (ref >90)
GLUCOSE: 94 mg/dL (ref 70–99)
Potassium: 4.6 mEq/L (ref 3.7–5.3)
SODIUM: 138 meq/L (ref 137–147)
TOTAL PROTEIN: 7.4 g/dL (ref 6.0–8.3)
Total Bilirubin: 0.5 mg/dL (ref 0.3–1.2)

## 2014-03-20 MED ORDER — FUROSEMIDE 10 MG/ML IJ SOLN
20.0000 mg | Freq: Every day | INTRAMUSCULAR | Status: DC
  Administered 2014-03-20 – 2014-03-23 (×4): 20 mg via INTRAVENOUS
  Filled 2014-03-20 (×4): qty 2

## 2014-03-20 MED ORDER — PANTOPRAZOLE SODIUM 40 MG PO TBEC
40.0000 mg | DELAYED_RELEASE_TABLET | Freq: Every day | ORAL | Status: DC
  Administered 2014-03-21 – 2014-03-22 (×2): 40 mg via ORAL
  Filled 2014-03-20 (×3): qty 1

## 2014-03-20 NOTE — Plan of Care (Signed)
Problem: Phase I Progression Outcomes Goal: O2 sats > or equal 90% or at baseline Outcome: Progressing Goal: Dyspnea controlled at rest Outcome: Progressing Goal: Hemodynamically stable Outcome: Progressing Goal: Progress activity as tolerated unless otherwise ordered Outcome: Progressing Goal: Discharge plan established Outcome: Progressing

## 2014-03-20 NOTE — Progress Notes (Signed)
Patient's sister states that patient has been having pain in the upper epigastric area since October and has lost over 30 lbs.  Patient saw Dr. Laural Golden and was scheduled for an EGD and colonoscopy with Dr. Laural Golden 2 weeks ago but the appointment was cancelled because the patient was diagnosed with pneumonia.  Those tests were not rescheduled.  Patient's sister also states the patient had a chest xray and CT of the abdomen a few weeks ago ordered by Dr. Laural Golden, as well as a barium swallow study at [facility].

## 2014-03-20 NOTE — Progress Notes (Signed)
Speech Language Pathology Treatment:    Patient Details Name: [name] MRN: [id number] DOB: [birth year] Today's Date: 03/20/2014 Time:  - 2:00 PM    Assessment / Plan / Recommendation Clinical Impression  [name] was scheduled for MBSS today, however it was determined that pt recently had MBSS at [facility] per pt's sister. SLP spoke with [name], [place] at [facility] who confirmed that he had MBSS on 02/22/2014. Results showed: "mild oropharyngeal phase dysphagia... decreased A-P transit, premature spillage, min pharyngeal residue...clears with subsequent swallow...stasis in cervical and thoracic esophagus". A regular diet with thin liquids was recommended. Pt was scheduled for EGD with Dr. Laural Pacheco on 03/08/2014, but had to be canceled due to pt with PNA. Pt has lost a great deal of weight and only eats a small amount at a time while complaining of epigastric pain. Dysphagia felt to be related to decreased esophageal motility. Recommend GI consult while in acute setting for possible EGD if appropriate. Pt only able to eat puree and liquids at this time due to discomfort with semi-solids. SLP spoke at length with pt's sister who is hoping that Dr. Laural Pacheco will see pt while here. SLP suggested downgrading diet to puree until seen by GI; continue thin liquids; strict reflux precautions. Page sent to Dr. Jerilee Hoh with above information.    HPI HPI: [name] is a 68 y.o. year old male with significant past medical history of afib on xarelto, PVD, CAD, HTN, type 2 DM, chronic chest pain presenting with weakness, acute resp failure w/ hypoxia, encephalopathy. Level V caveat as pt history is noncontributory. Pt resident of local SNF. Per report, pt w/ generalized weakness x 2 days. No reported falls. Pt noted w/ inability to swallow earlier today at SNF. Vitals check w/ noted O2 sats @ 83% on RA. Supplemental O2 placed and pt directed to ER for further evaluation. SLP asked to evaluate swallow  at bedside due to history of dysphagia. Chart review reveals that pt is followed by Dr. Laural Pacheco and Deberah Castle for epigastric pain and weight loss. He was supposed to have EGD on 03/08/2014, however this was cancelled. Pt's brother states that this was cancelled because pt had PNA. His brother also reports that pt has complained of epigastric pain for quite some time.    Pertinent Vitals    SLP Plan  Consult other service (comment) (GI consult recommended)    Recommendations Liquids provided via: Straw;Cup Medication Administration: Whole meds with liquid Supervision: Patient able to self feed;Intermittent supervision to cue for compensatory strategies Compensations: Slow rate;Small sips/bites Postural Changes and/or Swallow Maneuvers: Seated upright 90 degrees;Upright 30-60 min after meal              Oral Care Recommendations: Oral care BID Plan: Consult other service (comment) (GI consult recommended)   Thank you,  Genene Churn, [place]       [place] 03/20/2014, 3:04 PM

## 2014-03-20 NOTE — Progress Notes (Signed)
Speech Pathology MBSS scheduled for 1:30 PM today.  Thank you,  Genene Churn, CCC-SLP [phone]

## 2014-03-20 NOTE — Progress Notes (Signed)
Transporter came to get patient for MBSS.  Family at bedside was not sure patient needed another barium swallow study done.  Dabney, speech therapist, was contacted.  She is going to come speak to the family and try to get results from previous study.

## 2014-03-20 NOTE — Progress Notes (Signed)
Per family's request, called patient's sister, Con Memos, informing her that Dr. Laural Golden was consulted and is planning an endoscopy in a couple of days since the patient is on xarelto.

## 2014-03-20 NOTE — Progress Notes (Signed)
Pt urine output for this shift was 250cc.  Patient's oral intake poor.  Dr. Jerilee Hoh notified.  Dr. Jerilee Hoh stated she would place order for IV lasix to be started.  Will continue to monitor patient.

## 2014-03-20 NOTE — Progress Notes (Signed)
Patient is 68 year old Caucasian male with multiple medical problems including history of colon cancer in remission, atrial fibrillation, seizure disorder and cognitive impairment since CVA in 2004 was been cared for at [facility]. Patient was seen in our office on 02/13/2014 for 3 months history of dysphagia epigastric pain and anorexia and weight loss. He was scheduled to undergo EGD and colonoscopy. In the meantime he developed pneumonia and procedures were canceled. Patient was admitted to hospitalist service early yesterday morning for respiratory failure felt to be due to CHF has improved with therapy. Patient's sister Ms. Dois Davenport requested GI evaluation while he is hospitalized. Patient denies shortness of breath. He continues to complain of dysphagia and epigastric pain. He denies melena or rectal bleeding. Patient's abdominal exam reveals soft abdomen without tenderness and no megaly or masses.  Assessment; #1. Dysphagia epigastric pain anorexia and weight loss. Patient is at risk for gastroesophageal reflux disease. He was recently treated with antibiotics for pneumonia and may have superadded Candida. However symptoms started long before he was treated with antibiotics. Upper GI tract needs to be evaluated endoscopically. Since he may require esophageal dilation he needs to be off anticoagulants for 2 days. #2. History of colon carcinoma. Last colonoscopy was in January 2013 in [place], [place]. Will delay surveillance colonoscopy for now. #3. Acute respiratory failure secondary to CHF. Patient is improving with therapy. He has history of CAD, status post CABG in 2008. #4. 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  Recommendations; Begin pantoprazole 40 mg by mouth every morning. Hold Xarelto. EGD possible ED on 03/22/2014.  Patient's condition discussed with his sister Ms. Darlen Round and she is very much interested for patient to be evaluated.

## 2014-03-20 NOTE — Progress Notes (Signed)
[facility] PROGRESS NOTE  [name] QMG:[id number] DOB: 26-Feb-1946 DOA: 03/18/2014 PCP: Glenda Chroman., MD  Assessment/Plan: Acute Hypoxemic Respiratory Failure -2/2 diastolic CHF. -See below for details.  Acute Diastolic CHF -Echo: Left ventricle: The cavity size was normal. Wall thickness was increased in a pattern of mild LVH. There was moderate focal basal hypertrophy of the septum. Systolic function was normal. The estimated ejection fraction was in the range of 60% to 65%. There is hypokinesis of the mid-apicalanteroseptal and apical myocardium. Doppler parameters are consistent with abnormal left ventricular relaxation (grade 1 diastolic dysfunction).  -1.9 L negative since admission. -Continue lasix 20 mg daily. -See no need for CTA to r/o PE as we currently have an explanation for his hypoxemia.  Acute Encephalopathy -?his baseline. -Seems to be improving. -May have been related to hypoxemia present on admission.  HTN -Well controlled. -BP meds on hold  DM -Well controlled.  Code Status: Full Code Family Communication: Patient only  Disposition Plan: Back to SNF once medically ready   Consultants:  None   Antibiotics:  None   Subjective: No issues  Objective: Filed Vitals:   03/19/14 1400 03/19/14 2212 03/20/14 0620 03/20/14 1546  BP: 89/51 137/59 122/62 123/59  Pulse: 67 64 76 73  Temp:  98.8 F (37.1 C) 99.2 F (37.3 C) 98.6 F (37 C)  TempSrc:  Oral Oral Oral  Resp: 14 16 16 15   Height:      Weight:   79.2 kg (174 lb 9.7 oz)   SpO2: 92% 93% 93% 94%    Intake/Output Summary (Last 24 hours) at 03/20/14 1843 Last data filed at 03/20/14 1822  Gross per 24 hour  Intake    123 ml  Output   1200 ml  Net  -1077 ml   Filed Weights   03/19/14 0300 03/20/14 0620  Weight: 80.5 kg (177 lb 7.5 oz) 79.2 kg (174 lb 9.7 oz)    Exam:   General:  awake  Cardiovascular: RRR  Respiratory: mild bibasilar  crackles  Abdomen: S/NT/ND/+BS  Extremities: trace bilateral edema   Neurologic:  Moves all 4 spontaneously  Data Reviewed: Basic Metabolic Panel:  Recent Labs Lab 03/18/14 2317 03/19/14 0238 03/20/14 0615  NA 136* 136* 138  K 4.5 4.6 4.6  CL 95* 96 97  CO2 28 25 31   GLUCOSE 147* 111* 94  BUN 33* 32* 29*  CREATININE 0.88 0.88 0.75  CALCIUM 9.2 8.8 9.1   Liver Function Tests:  Recent Labs Lab 03/19/14 0238 03/20/14 0615  AST 13 10  ALT 5 <5  ALKPHOS 45 53  BILITOT 0.4 0.5  PROT 7.1 7.4  ALBUMIN 2.4* 2.7*   No results for input(s): LIPASE, AMYLASE in the last 168 hours.  Recent Labs Lab 03/19/14 0142  AMMONIA 35   CBC:  Recent Labs Lab 03/18/14 2317 03/19/14 0238 03/20/14 0615  WBC 9.5 10.1 9.2  NEUTROABS 5.0 5.0 4.5  HGB 15.6 14.8 15.4  HCT 46.7 45.6 47.2  MCV 93.6 93.6 92.9  PLT 120* 100* 126*   Cardiac Enzymes:  Recent Labs Lab 03/18/14 2317 03/19/14 0238 03/19/14 0851 03/19/14 1421  TROPONINI <0.30 <0.30 <0.30 <0.30   BNP (last 3 results)  Recent Labs  03/18/14 2317  PROBNP 572.2*   CBG:  Recent Labs Lab 03/19/14 1650 03/19/14 2212 03/20/14 0802 03/20/14 1135 03/20/14 1624  GLUCAP 102* 109* 94 91 102*    Recent Results (from the past 240 hour(s))  MRSA PCR Screening  Status: Abnormal   Collection Time: 03/19/14  2:53 AM  Result Value Ref Range Status   MRSA by PCR POSITIVE (A) NEGATIVE Final    Comment: CRITICAL RESULT CALLED TO, READ BACK BY AND VERIFIED WITH: SHONEWITZ,L AT 9:50AM ON 03/19/14 BY FESTERMAN,C        The GeneXpert MRSA Assay (FDA approved for NASAL specimens only), is one component of a comprehensive MRSA colonization surveillance program. It is not intended to diagnose MRSA infection nor to guide or monitor treatment for MRSA infections.      Studies: Ct Head Wo Contrast  03/19/2014   CLINICAL DATA:  Acute onset of encephalopathy, weakness and hypoxia. Initial encounter.  EXAM: CT HEAD  WITHOUT CONTRAST  TECHNIQUE: Contiguous axial images were obtained from the base of the skull through the vertex without intravenous contrast.  COMPARISON:  CT of the head performed 08/12/2013  FINDINGS: There is no evidence of acute infarction, mass lesion, or intra- or extra-axial hemorrhage on CT.  A chronic infarct is noted at the left temporoparietal region, with associated encephalomalacia. Prominence of the ventricles and sulci reflects mild cortical volume loss.  The brainstem and fourth ventricle are within normal limits. The basal ganglia are unremarkable in appearance. No mass effect or midline shift is seen.  There is no evidence of fracture; visualized osseous structures are unremarkable in appearance. The orbits are within normal limits. The paranasal sinuses and mastoid air cells are well-aerated. No significant soft tissue abnormalities are seen.  IMPRESSION: 1. No acute intracranial pathology seen on CT. 2. Mild cortical volume loss noted. 3. 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   Electronically Signed   By: Garald Balding M.D.   On: 03/19/2014 02:04   Dg Chest Portable 1 View  03/18/2014   CLINICAL DATA:  Chest pain. History of colon cancer, CVA, coronary artery disease and CABG. Initial encounter.  EXAM: PORTABLE CHEST - 1 VIEW  COMPARISON:  11/17/2013  FINDINGS: Cardiomegaly, pulmonary vascular congestion and moderate pulmonary edema noted.  Bibasilar atelectasis present.  There is no evidence of pneumothorax.  Elevation the right hemidiaphragm again noted.  Median sternotomy wires again identified.  IMPRESSION: Cardiomegaly with moderate pulmonary edema   Electronically Signed   By: Hassan Rowan M.D.   On: 03/18/2014 23:00    Scheduled Meds: . antiseptic oral rinse  7 mL Mouth Rinse BID  . Chlorhexidine Gluconate Cloth  6 each Topical Q0600  . divalproex  1,000 mg Oral 2 times per day  . divalproex  750 mg Oral QPC lunch  .  levothyroxine  175 mcg Oral QAC breakfast  . mupirocin ointment  1 application Nasal BID  . pregabalin  100 mg Oral TID  . rivaroxaban  20 mg Oral Daily  . rosuvastatin  20 mg Oral Daily  . sertraline  200 mg Oral Daily  . sodium chloride  3 mL Intravenous Q12H   Continuous Infusions:   Principal Problem:   Acute diastolic CHF (congestive heart failure) Active Problems:   Encephalopathy   Essential hypertension, benign   Hyperlipidemia   Acute respiratory failure with hypoxia    Time spent: 35 minutes. Greater than 50% of this time was spent in direct contact with the patient coordinating care.    Lelon Frohlich  [facility] Pager [phone]  If 7PM-7AM, please contact night-coverage at [email or web address], password [facility] 03/20/2014, 6:43 PM  LOS: 2 days

## 2014-03-21 DIAGNOSIS — R1013 Epigastric pain: Secondary | ICD-10-CM

## 2014-03-21 DIAGNOSIS — Z85038 Personal history of other malignant neoplasm of large intestine: Secondary | ICD-10-CM | POA: Diagnosis not present

## 2014-03-21 DIAGNOSIS — I10 Essential (primary) hypertension: Secondary | ICD-10-CM | POA: Diagnosis present

## 2014-03-21 DIAGNOSIS — R634 Abnormal weight loss: Secondary | ICD-10-CM

## 2014-03-21 DIAGNOSIS — K2970 Gastritis, unspecified, without bleeding: Secondary | ICD-10-CM | POA: Diagnosis present

## 2014-03-21 DIAGNOSIS — R0602 Shortness of breath: Secondary | ICD-10-CM | POA: Diagnosis present

## 2014-03-21 DIAGNOSIS — Z7901 Long term (current) use of anticoagulants: Secondary | ICD-10-CM | POA: Diagnosis not present

## 2014-03-21 DIAGNOSIS — Z79899 Other long term (current) drug therapy: Secondary | ICD-10-CM | POA: Diagnosis not present

## 2014-03-21 DIAGNOSIS — E782 Mixed hyperlipidemia: Secondary | ICD-10-CM | POA: Diagnosis present

## 2014-03-21 DIAGNOSIS — I739 Peripheral vascular disease, unspecified: Secondary | ICD-10-CM | POA: Diagnosis present

## 2014-03-21 DIAGNOSIS — K219 Gastro-esophageal reflux disease without esophagitis: Secondary | ICD-10-CM | POA: Diagnosis present

## 2014-03-21 DIAGNOSIS — K259 Gastric ulcer, unspecified as acute or chronic, without hemorrhage or perforation: Secondary | ICD-10-CM | POA: Diagnosis present

## 2014-03-21 DIAGNOSIS — I2510 Atherosclerotic heart disease of native coronary artery without angina pectoris: Secondary | ICD-10-CM | POA: Diagnosis present

## 2014-03-21 DIAGNOSIS — J9601 Acute respiratory failure with hypoxia: Secondary | ICD-10-CM | POA: Diagnosis present

## 2014-03-21 DIAGNOSIS — Z951 Presence of aortocoronary bypass graft: Secondary | ICD-10-CM | POA: Diagnosis not present

## 2014-03-21 DIAGNOSIS — Z9049 Acquired absence of other specified parts of digestive tract: Secondary | ICD-10-CM | POA: Diagnosis present

## 2014-03-21 DIAGNOSIS — G9340 Encephalopathy, unspecified: Secondary | ICD-10-CM | POA: Diagnosis present

## 2014-03-21 DIAGNOSIS — Z8673 Personal history of transient ischemic attack (TIA), and cerebral infarction without residual deficits: Secondary | ICD-10-CM | POA: Diagnosis not present

## 2014-03-21 DIAGNOSIS — E119 Type 2 diabetes mellitus without complications: Secondary | ICD-10-CM | POA: Diagnosis present

## 2014-03-21 DIAGNOSIS — I252 Old myocardial infarction: Secondary | ICD-10-CM | POA: Diagnosis not present

## 2014-03-21 DIAGNOSIS — Z87891 Personal history of nicotine dependence: Secondary | ICD-10-CM | POA: Diagnosis not present

## 2014-03-21 DIAGNOSIS — I480 Paroxysmal atrial fibrillation: Secondary | ICD-10-CM | POA: Diagnosis present

## 2014-03-21 DIAGNOSIS — I5031 Acute diastolic (congestive) heart failure: Secondary | ICD-10-CM | POA: Diagnosis present

## 2014-03-21 DIAGNOSIS — Z833 Family history of diabetes mellitus: Secondary | ICD-10-CM | POA: Diagnosis not present

## 2014-03-21 DIAGNOSIS — G40909 Epilepsy, unspecified, not intractable, without status epilepticus: Secondary | ICD-10-CM | POA: Diagnosis present

## 2014-03-21 DIAGNOSIS — G4733 Obstructive sleep apnea (adult) (pediatric): Secondary | ICD-10-CM | POA: Diagnosis present

## 2014-03-21 LAB — BASIC METABOLIC PANEL
ANION GAP: 14 (ref 5–15)
BUN: 27 mg/dL — ABNORMAL HIGH (ref 6–23)
CALCIUM: 8.9 mg/dL (ref 8.4–10.5)
CHLORIDE: 97 meq/L (ref 96–112)
CO2: 28 meq/L (ref 19–32)
Creatinine, Ser: 0.76 mg/dL (ref 0.50–1.35)
GFR calc non Af Amer: >90 mL/min (ref >90)
Glucose, Bld: 79 mg/dL (ref 70–99)
Potassium: 4.1 mEq/L (ref 3.7–5.3)
SODIUM: 139 meq/L (ref 137–147)

## 2014-03-21 LAB — CBC
HCT: 46.1 % (ref 39.0–52.0)
Hemoglobin: 15.1 g/dL (ref 13.0–17.0)
MCH: 30.2 pg (ref 26.0–34.0)
MCHC: 32.8 g/dL (ref 30.0–36.0)
MCV: 92.2 fL (ref 78.0–100.0)
PLATELETS: 159 10*3/uL (ref 150–400)
RBC: 5 MIL/uL (ref 4.22–5.81)
RDW: 16 % — AB (ref 11.5–15.5)
WBC: 10 10*3/uL (ref 4.0–10.5)

## 2014-03-21 MED ORDER — SODIUM CHLORIDE 0.9 % IV SOLN
INTRAVENOUS | Status: DC
  Administered 2014-03-22: 09:00:00 via INTRAVENOUS

## 2014-03-21 NOTE — Progress Notes (Signed)
[facility] PROGRESS NOTE  BROOKLYN JEFF YTK:[id number] DOB: [birth year] DOA: 03/18/2014 PCP: Glenda Chroman., MD  Assessment/Plan: Acute Hypoxemic Respiratory Failure -2/2 diastolic CHF. -See below for details.  Acute Diastolic CHF -Echo: Left ventricle: The cavity size was normal. Wall thickness was increased in a pattern of mild LVH. There was moderate focal basal hypertrophy of the septum. Systolic function was normal. The estimated ejection fraction was in the range of 60% to 65%. There is hypokinesis of the mid-apicalanteroseptal and apical myocardium. Doppler parameters are consistent with abnormal left ventricular relaxation (grade 1 diastolic dysfunction).  -2.4 L negative since admission. -Continue lasix 20 mg daily. -See no need for CTA to r/o PE as we currently have an explanation for his hypoxemia. -Volume status is improved.  Acute Encephalopathy -?his baseline. -Seems to be improving/resolved: is alert and oriented today and can answer questions appropriately. -May have been related to hypoxemia present on admission.  Dysphagia/Weight Loss -Has been seen by GI, Dr. Laural Golden with plans for EGD 12/4. -On PPI.  Atrial Fibrillation -Rate controlled. -Xarelto on hold for EGD.  HTN -Well controlled. -BP meds on hold  DM -Well controlled.  Code Status: Full Code Family Communication: Patient only  Disposition Plan: Back to SNF once medically ready   Consultants:  GI, Dr. Laural Golden   Antibiotics:  None   Subjective: No complaints  Objective: Filed Vitals:   03/20/14 0620 03/20/14 1546 03/20/14 2156 03/21/14 0629  BP: 122/62 123/59 143/72 117/63  Pulse: 76 73 63 79  Temp: 99.2 F (37.3 C) 98.6 F (37 C) 98.2 F (36.8 C) 98.9 F (37.2 C)  TempSrc: Oral Oral Oral Oral  Resp: 16 15 15 21   Height:      Weight: 79.2 kg (174 lb 9.7 oz)   82 kg (180 lb 12.4 oz)  SpO2: 93% 94% 93% 90%    Intake/Output Summary (Last 24 hours)  at 03/21/14 1445 Last data filed at 03/21/14 5573  Gross per 24 hour  Intake      0 ml  Output    800 ml  Net   -800 ml   Filed Weights   03/19/14 0300 03/20/14 0620 03/21/14 0629  Weight: 80.5 kg (177 lb 7.5 oz) 79.2 kg (174 lb 9.7 oz) 82 kg (180 lb 12.4 oz)    Exam:   General:  Awake, oriented  Cardiovascular: RRR  Respiratory: CTA B  Abdomen: S/NT/ND/+BS  Extremities: no C/C/E  Neurologic:  Moves all 4 spontaneously  Data Reviewed: Basic Metabolic Panel:  Recent Labs Lab 03/18/14 2317 03/19/14 0238 03/20/14 0615 03/21/14 0600  NA 136* 136* 138 139  K 4.5 4.6 4.6 4.1  CL 95* 96 97 97  CO2 28 25 31 28   GLUCOSE 147* 111* 94 79  BUN 33* 32* 29* 27*  CREATININE 0.88 0.88 0.75 0.76  CALCIUM 9.2 8.8 9.1 8.9   Liver Function Tests:  Recent Labs Lab 03/19/14 0238 03/20/14 0615  AST 13 10  ALT 5 <5  ALKPHOS 45 53  BILITOT 0.4 0.5  PROT 7.1 7.4  ALBUMIN 2.4* 2.7*   No results for input(s): LIPASE, AMYLASE in the last 168 hours.  Recent Labs Lab 03/19/14 0142  AMMONIA 35   CBC:  Recent Labs Lab 03/18/14 2317 03/19/14 0238 03/20/14 0615 03/21/14 0600  WBC 9.5 10.1 9.2 10.0  NEUTROABS 5.0 5.0 4.5  --   HGB 15.6 14.8 15.4 15.1  HCT 46.7 45.6 47.2 46.1  MCV 93.6 93.6 92.9 92.2  PLT 120* 100* 126* 159   Cardiac Enzymes:  Recent Labs Lab 03/18/14 2317 03/19/14 0238 03/19/14 0851 03/19/14 1421  TROPONINI <0.30 <0.30 <0.30 <0.30   BNP (last 3 results)  Recent Labs  03/18/14 2317  PROBNP 572.2*   CBG:  Recent Labs Lab 03/19/14 2212 03/20/14 0802 03/20/14 1135 03/20/14 1624 03/20/14 2141  GLUCAP 109* 94 91 102* 121*    Recent Results (from the past 240 hour(s))  MRSA PCR Screening     Status: Abnormal   Collection Time: 03/19/14  2:53 AM  Result Value Ref Range Status   MRSA by PCR POSITIVE (A) NEGATIVE Final    Comment: CRITICAL RESULT CALLED TO, READ BACK BY AND VERIFIED WITH: SHONEWITZ,L AT 9:50AM ON 03/19/14 BY  FESTERMAN,C        The GeneXpert MRSA Assay (FDA approved for NASAL specimens only), is one component of a comprehensive MRSA colonization surveillance program. It is not intended to diagnose MRSA infection nor to guide or monitor treatment for MRSA infections.      Studies: No results found.  Scheduled Meds: . antiseptic oral rinse  7 mL Mouth Rinse BID  . Chlorhexidine Gluconate Cloth  6 each Topical Q0600  . divalproex  1,000 mg Oral 2 times per day  . divalproex  750 mg Oral QPC lunch  . furosemide  20 mg Intravenous Daily  . levothyroxine  175 mcg Oral QAC breakfast  . mupirocin ointment  1 application Nasal BID  . pantoprazole  40 mg Oral QAC breakfast  . pregabalin  100 mg Oral TID  . rosuvastatin  20 mg Oral Daily  . sertraline  200 mg Oral Daily  . sodium chloride  3 mL Intravenous Q12H   Continuous Infusions:   Principal Problem:   Acute diastolic CHF (congestive heart failure) Active Problems:   Encephalopathy   Essential hypertension, benign   Hyperlipidemia   Acute respiratory failure with hypoxia    Time spent: 30 minutes. Greater than 50% of this time was spent in direct contact with the patient coordinating care.    Lelon Frohlich  [facility] Pager [phone]  If 7PM-7AM, please contact night-coverage at [email or web address], password [facility] 03/21/2014, 2:45 PM  LOS: 3 days

## 2014-03-21 NOTE — Progress Notes (Signed)
UR completed 

## 2014-03-21 NOTE — Progress Notes (Addendum)
Patient ID: [name], male   DOB: [birth year], 68 y.o.   MRN: [id number] Alert. Denies SOB. 02 going.  Denies abdominal pain this am.  Dysphagia with hx of epigastric pain after eating and weight loss. (13 pound weight loss since OV in October) EGD/possible ED tomorrow.    GI attending note; Patient continues to complain of dysphagia no abdominal pain reported today. Proceed with EGD and possible EGD in a.m. Procedure has been discussed with patient's sister Ms.Prusia who has POA and she is agreeable.

## 2014-03-22 ENCOUNTER — Encounter (HOSPITAL_COMMUNITY): Payer: Self-pay | Admitting: *Deleted

## 2014-03-22 ENCOUNTER — Encounter (HOSPITAL_COMMUNITY)
Admission: EM | Disposition: A | Discharge status: Skilled Nursing Facility | Payer: Self-pay | Source: Home / Self Care | Attending: Internal Medicine

## 2014-03-22 DIAGNOSIS — K3189 Other diseases of stomach and duodenum: Secondary | ICD-10-CM

## 2014-03-22 DIAGNOSIS — K317 Polyp of stomach and duodenum: Secondary | ICD-10-CM

## 2014-03-22 DIAGNOSIS — K2970 Gastritis, unspecified, without bleeding: Secondary | ICD-10-CM

## 2014-03-22 HISTORY — PX: ESOPHAGOGASTRODUODENOSCOPY: SHX5428

## 2014-03-22 HISTORY — PX: MALONEY DILATION: SHX5535

## 2014-03-22 SURGERY — EGD (ESOPHAGOGASTRODUODENOSCOPY)
Anesthesia: Moderate Sedation

## 2014-03-22 MED ORDER — MIDAZOLAM HCL 5 MG/5ML IJ SOLN
INTRAMUSCULAR | Status: AC
  Filled 2014-03-22: qty 10

## 2014-03-22 MED ORDER — MEPERIDINE HCL 50 MG/ML IJ SOLN
INTRAMUSCULAR | Status: AC
  Filled 2014-03-22: qty 1

## 2014-03-22 MED ORDER — ACETAMINOPHEN 325 MG PO TABS
ORAL_TABLET | ORAL | Status: AC
  Filled 2014-03-22: qty 2

## 2014-03-22 MED ORDER — MEPERIDINE HCL 50 MG/ML IJ SOLN
INTRAMUSCULAR | Status: DC | PRN
  Administered 2014-03-22: 20 mg via INTRAVENOUS

## 2014-03-22 MED ORDER — BUTAMBEN-TETRACAINE-BENZOCAINE 2-2-14 % EX AERO
INHALATION_SPRAY | CUTANEOUS | Status: DC | PRN
  Administered 2014-03-22 (×2): 1 via TOPICAL

## 2014-03-22 MED ORDER — ACETAMINOPHEN 325 MG PO TABS: 650.0000 mg | ORAL_TABLET | Freq: Four times a day (QID) | ORAL | Status: DC | PRN

## 2014-03-22 MED ORDER — MIDAZOLAM HCL 5 MG/5ML IJ SOLN
INTRAMUSCULAR | Status: DC | PRN
  Administered 2014-03-22: 1 mg via INTRAVENOUS

## 2014-03-22 MED ORDER — ACETAMINOPHEN 500 MG PO TABS
ORAL_TABLET | ORAL | Status: AC
  Filled 2014-03-22: qty 1

## 2014-03-22 MED ORDER — ACETAMINOPHEN 500 MG PO TABS
500.0000 mg | ORAL_TABLET | Freq: Once | ORAL | Status: AC
  Administered 2014-03-22: 500 mg via ORAL

## 2014-03-22 MED ORDER — PANTOPRAZOLE SODIUM 40 MG PO TBEC
40.0000 mg | DELAYED_RELEASE_TABLET | Freq: Two times a day (BID) | ORAL | Status: DC
  Administered 2014-03-23: 40 mg via ORAL

## 2014-03-22 NOTE — Care Management Note (Signed)
Plans for pt to discharge back to facility tomorrow. CSW aware of discharge plan and will arrange for return to facility. No CM needs identified at this time. Pt given IM on 03/22/2014 by Daryl Eastern, RN.

## 2014-03-22 NOTE — Op Note (Addendum)
EGD PROCEDURE REPORT  PATIENT:  [name]  MR#:  [id number] Birthdate:  1945-12-16, 68 y.o., male Endoscopist:  Dr. Rogene Houston, MD Referred By:  Dr. Lannie Fields, MD  Procedure Date: 03/22/2014  Procedure:   EGD  Indications:  Patient is 68 year old Caucasian male with multiple medical problems who has dysphagia postprandial epigastric pain and he's been losing weight. He was hospitalized few days ago for CHF and has improved with therapy.            Informed Consent:  The risks, benefits, alternatives & imponderables which include, but are not limited to, bleeding, infection, perforation, drug reaction and potential missed lesion have been reviewed.  The potential for biopsy, lesion removal, esophageal dilation, etc. have also been discussed.  Questions have been answered.  All parties agreeable.  Please see history & physical in medical record for more information. Informed consent was obtained from patient's sister missed Terri Prusia.  Medications:  Demerol 20 mg IV Versed 1 mg IV Cetacaine spray topically for oropharyngeal anesthesia  Description of procedure:  The endoscope was introduced through the mouth and advanced to the second portion of the duodenum without difficulty or limitations. The mucosal surfaces were surveyed very carefully during advancement of the scope and upon withdrawal.  Findings:  Esophagus:  Mucosa of the esophagus was normal. GE junction was unremarkable. No ring or stricture noted. Stomach:  Stomach was empty and distended very well with insufflation. A 10 mm x 4 mm ulcer noted at proximal gastric body along the greater curvature. Pronounce edema and erythema noted to mucosa at gastric body as well as antrum. There was 12 mm submucosal lesion at gastric body along greater curvature with erythema and erosion to overlying mucosa. Pyloric channel was patent. Angularis was examined and no ulcer noted. Fundus and cardia similarly were normal without  varices. Duodenum:  Normal bulbar and post bulbar mucosa.  Therapeutic/Diagnostic Maneuvers Performed:  None  Complications:  None  Impression: No evidence of erosive esophagitis ring or stricture formation. Diffuse gastritis with 10 x 4 mm ulcer at proximal gastric body. 12 mm submucosal lesion at gastric body suspicious for lipoma or leiomyoma.  Recommendations:  Increased pantoprazole to 40 mg by mouth twice a day. H. pylori serology. Mechanical soft diet. Patient will need barium pill esophagogram if he remains with dysphagia.  REHMAN,NAJEEB U  03/22/2014  6:22 PM  CC: Dr. Glenda Chroman., MD & Dr. Rayne Du ref. provider found

## 2014-03-22 NOTE — Op Note (Signed)
Patient complained of foot pain in preop area Dr. Laural Golden notified tylenol 500mg  po ordered. Patient states pain relieved in procedural area.

## 2014-03-22 NOTE — Progress Notes (Signed)
[facility] PROGRESS NOTE  [name]:[id number] DOB: [birth year] DOA: 03/18/2014 PCP: Glenda Chroman., MD  Assessment/Plan: Acute Hypoxemic Respiratory Failure -2/2 diastolic CHF. -See below for details.  Acute Diastolic CHF -Echo: Left ventricle: The cavity size was normal. Wall thickness was increased in a pattern of mild LVH. There was moderate focal basal hypertrophy of the septum. Systolic function was normal. The estimated ejection fraction was in the range of 60% to 65%. There is hypokinesis of the mid-apicalanteroseptal and apical myocardium. Doppler parameters are consistent with abnormal left ventricular relaxation (grade 1 diastolic dysfunction).  -3.8 L negative since admission. -Continue lasix 20 mg daily. -See no need for CTA to r/o PE as we currently have an explanation for his hypoxemia. -Volume status is improved.  Acute Encephalopathy -?his baseline. -Seems to be resolved: is alert and oriented today and can answer questions appropriately. -May have been related to hypoxemia present on admission. Also ?some degree of baseline dementia.  Dysphagia/Weight Loss -Has been seen by GI, Dr. Laural Golden with plans for EGD today -On PPI.  Atrial Fibrillation -Rate controlled. -Xarelto on hold for EGD.  HTN -Well controlled. -BP meds on hold  DM -Well controlled.  Code Status: Full Code Family Communication: Patient only  Disposition Plan: Back to SNF once medically ready; hopefully in next 24-48 hours.   Consultants:  GI, Dr. Laural Golden   Antibiotics:  None   Subjective: No complaints  Objective: Filed Vitals:   03/21/14 2134 03/22/14 0623 03/22/14 1409 03/22/14 1516  BP: 103/59 150/67 105/65 140/70  Pulse: 79 82 72 71  Temp: 98.4 F (36.9 C) 98.4 F (36.9 C) 97.6 F (36.4 C) 97.9 F (36.6 C)  TempSrc: Oral Oral Axillary Oral  Resp: 20 18 18 17   Height:      Weight:  82.2 kg (181 lb 3.5 oz)    SpO2: 92% 95% 94%  94%    Intake/Output Summary (Last 24 hours) at 03/22/14 1654 Last data filed at 03/22/14 1500  Gross per 24 hour  Intake    120 ml  Output   1300 ml  Net  -1180 ml   Filed Weights   03/20/14 0620 03/21/14 0629 03/22/14 0623  Weight: 79.2 kg (174 lb 9.7 oz) 82 kg (180 lb 12.4 oz) 82.2 kg (181 lb 3.5 oz)    Exam:   General:  Awake, oriented  Cardiovascular: RRR  Respiratory: CTA B  Abdomen: S/NT/ND/+BS  Extremities: no C/C/E  Neurologic:  Moves all 4 spontaneously  Data Reviewed: Basic Metabolic Panel:  Recent Labs Lab 03/18/14 2317 03/19/14 0238 03/20/14 0615 03/21/14 0600  NA 136* 136* 138 139  K 4.5 4.6 4.6 4.1  CL 95* 96 97 97  CO2 28 25 31 28   GLUCOSE 147* 111* 94 79  BUN 33* 32* 29* 27*  CREATININE 0.88 0.88 0.75 0.76  CALCIUM 9.2 8.8 9.1 8.9   Liver Function Tests:  Recent Labs Lab 03/19/14 0238 03/20/14 0615  AST 13 10  ALT 5 <5  ALKPHOS 45 53  BILITOT 0.4 0.5  PROT 7.1 7.4  ALBUMIN 2.4* 2.7*   No results for input(s): LIPASE, AMYLASE in the last 168 hours.  Recent Labs Lab 03/19/14 0142  AMMONIA 35   CBC:  Recent Labs Lab 03/18/14 2317 03/19/14 0238 03/20/14 0615 03/21/14 0600  WBC 9.5 10.1 9.2 10.0  NEUTROABS 5.0 5.0 4.5  --   HGB 15.6 14.8 15.4 15.1  HCT 46.7 45.6 47.2 46.1  MCV 93.6 93.6 92.9  92.2  PLT 120* 100* 126* 159   Cardiac Enzymes:  Recent Labs Lab 03/18/14 2317 03/19/14 0238 03/19/14 0851 03/19/14 1421  TROPONINI <0.30 <0.30 <0.30 <0.30   BNP (last 3 results)  Recent Labs  03/18/14 2317  PROBNP 572.2*   CBG:  Recent Labs Lab 03/19/14 2212 03/20/14 0802 03/20/14 1135 03/20/14 1624 03/20/14 2141  GLUCAP 109* 94 91 102* 121*    Recent Results (from the past 240 hour(s))  MRSA PCR Screening     Status: Abnormal   Collection Time: 03/19/14  2:53 AM  Result Value Ref Range Status   MRSA by PCR POSITIVE (A) NEGATIVE Final    Comment: CRITICAL RESULT CALLED TO, READ BACK BY AND VERIFIED  WITH: SHONEWITZ,L AT 9:50AM ON 03/19/14 BY FESTERMAN,C        The GeneXpert MRSA Assay (FDA approved for NASAL specimens only), is one component of a comprehensive MRSA colonization surveillance program. It is not intended to diagnose MRSA infection nor to guide or monitor treatment for MRSA infections.      Studies: No results found.  Scheduled Meds: . antiseptic oral rinse  7 mL Mouth Rinse BID  . Chlorhexidine Gluconate Cloth  6 each Topical Q0600  . divalproex  1,000 mg Oral 2 times per day  . divalproex  750 mg Oral QPC lunch  . furosemide  20 mg Intravenous Daily  . levothyroxine  175 mcg Oral QAC breakfast  . mupirocin ointment  1 application Nasal BID  . pantoprazole  40 mg Oral QAC breakfast  . pregabalin  100 mg Oral TID  . rosuvastatin  20 mg Oral Daily  . sertraline  200 mg Oral Daily  . sodium chloride  3 mL Intravenous Q12H   Continuous Infusions: . sodium chloride 20 mL/hr at 03/22/14 0907    Principal Problem:   Acute diastolic CHF (congestive heart failure) Active Problems:   Encephalopathy   Essential hypertension, benign   Hyperlipidemia   Acute respiratory failure with hypoxia    Time spent: 30 minutes. Greater than 50% of this time was spent in direct contact with the patient coordinating care.    Lelon Frohlich  [facility] Pager [phone]  If 7PM-7AM, please contact night-coverage at [email or web address], password [facility] 03/22/2014, 4:54 PM  LOS: 4 days

## 2014-03-23 MED ORDER — ENSURE COMPLETE PO LIQD
237.0000 mL | Freq: Two times a day (BID) | ORAL | Status: DC
  Administered 2014-03-23 (×2): 237 mL via ORAL

## 2014-03-23 NOTE — Discharge Summary (Signed)
Physician Discharge Summary  [name] NTI:[id number] DOB: December 01, 1945 DOA: 03/18/2014  PCP: Glenda Chroman., MD  Admit date: 03/18/2014 Discharge date: 03/23/2014  Time spent: 45 minutes  Recommendations for Outpatient Follow-up:  -Will be discharged back to SNF today.   Discharge Diagnoses:  Principal Problem:   Acute diastolic CHF (congestive heart failure) Active Problems:   Encephalopathy   Essential hypertension, benign   Hyperlipidemia   Acute respiratory failure with hypoxia   Discharge Condition: Stable and improved  Filed Weights   03/20/14 0620 03/21/14 0629 03/22/14 0623  Weight: 79.2 kg (174 lb 9.7 oz) 82 kg (180 lb 12.4 oz) 82.2 kg (181 lb 3.5 oz)    History of present illness:  This is a 68 y.o. year old male with significant past medical history of afib on xarelto, PVD, CAD, HTN, type 2 DM, chronic chest pain presenting with weakness, acute resp failure w/ hypoxia, encephalopathy. Level V caveat as pt history is noncontributory. Pt resident of local SNF. Per report, pt w/ generalized weakness x 2 days. No reported falls. Pt noted w/ inability to swallow earlier today at SNF. Vitals check w/ noted O2 sats @ 83% on RA. Supplemental O2 placed and pt directed to ER for further evaluation.  On presentation to the ER, temperature 97.6 heart rate in the 60s 70s, respirations and intense 20s, blood pressure in the 80s to 100s, satting 86% on room air, greater than 95% on 3-4 L. White blood cell count 9.5, hemoglobin 15.6, creatinine 0.88. Troponin negative 1. EKG sinus rhythm with borderline ST abnormality. Pro BNP 572. D-dimer 0.8. Chest x-ray indicative cardiomegaly with moderate pulmonary edema. Given IV Lasix in the ER.  Hospital Course:   Acute Hypoxemic Respiratory Failure -2/2 diastolic CHF. -See below for details. -Resolved. -No current oxygen requirements.  Acute Diastolic CHF -Echo: Left ventricle: The cavity size was normal. Wall thickness  was increased in a pattern of mild LVH. There was moderate focal basal hypertrophy of the septum. Systolic function was normal. The estimated ejection fraction was in the range of 60% to 65%. There is hypokinesis of the mid-apicalanteroseptal and apical myocardium. Doppler parameters are consistent with abnormal left ventricular relaxation (grade 1 diastolic dysfunction).  -5.1 L negative since admission. -Continue lasix at home dose. -Volume status is improved.  Acute Encephalopathy -?his baseline. -Seems to be resolved: is alert and oriented today and can answer questions appropriately. -May have been related to hypoxemia present on admission. Also ?some degree of baseline dementia.  Dysphagia/Weight Loss -Has been seen by GI, Dr. Laural Golden with plans for EGD 12/4: gastritis with gastric ulcer, no erosive esophagitis, no stricture formation. -On PPI BID.  Atrial Fibrillation -Rate controlled. -Resume xarelto on DC that had been held in anticipation of EGD.  HTN -Well controlled. -BP meds on hold  DM -Well controlled.   Procedures:  EGD as above   Consultations:  GI, Dr. Laural Golden  Discharge Instructions  Discharge Instructions    Diet - low sodium heart healthy    Complete by:  As directed      Increase activity slowly    Complete by:  As directed             Medication List    STOP taking these medications        cefTRIAXone 2 G injection  Commonly known as:  ROCEPHIN     methylPREDNISolone sodium succinate 125 mg/2 mL injection  Commonly known as:  SOLU-MEDROL     polyethylene  glycol-electrolytes 420 G solution  Commonly known as:  TRILYTE     zolpidem 5 MG tablet  Commonly known as:  AMBIEN      TAKE these medications        ALPRAZolam 0.5 MG tablet  Commonly known as:  XANAX  Take 0.5 mg by mouth 3 (three) times daily.     amLODipine 2.5 MG tablet  Commonly known as:  NORVASC  Take 2.5 mg by mouth daily.     divalproex 500 MG  DR tablet  Commonly known as:  DEPAKOTE  Take 500 mg by mouth 3 (three) times daily.     DUREZOL 0.05 % Emul  Generic drug:  Difluprednate  Apply 1 drop to eye every morning.     folic acid 1 MG tablet  Commonly known as:  FOLVITE  Take 1 mg by mouth daily.     furosemide 40 MG tablet  Commonly known as:  LASIX  Take 1 tablet (40 mg total) by mouth daily.     ipratropium-albuterol 0.5-2.5 (3) MG/3ML Soln  Commonly known as:  DUONEB  Take 3 mLs by nebulization every 6 (six) hours as needed (for shortness of breath/wheezing).     isosorbide dinitrate 20 MG tablet  Commonly known as:  ISORDIL  Take 60 mg by mouth 3 (three) times daily.     levothyroxine 175 MCG tablet  Commonly known as:  SYNTHROID, LEVOTHROID  Take 175 mcg by mouth daily before breakfast.     loperamide 2 MG capsule  Commonly known as:  IMODIUM  Take 2 mg by mouth as needed for diarrhea or loose stools.     metoprolol tartrate 25 MG tablet  Commonly known as:  LOPRESSOR  Take 25 mg by mouth 2 (two) times daily.     nitroGLYCERIN 0.4 MG SL tablet  Commonly known as:  NITROSTAT  Place 0.4 mg under the tongue every 5 (five) minutes as needed for chest pain.     oxyCODONE 15 MG immediate release tablet  Commonly known as:  ROXICODONE  Take 7.5 mg by mouth every 6 (six) hours as needed for pain (*Only given as needed for pain greater than 5).     pantoprazole 40 MG tablet  Commonly known as:  PROTONIX  Take 40 mg by mouth 2 (two) times daily.     PATADAY 0.2 % Soln  Generic drug:  Olopatadine HCl  Apply 1 drop to eye every morning.     potassium chloride SA 20 MEQ tablet  Commonly known as:  K-DUR,KLOR-CON  Take 20 mEq by mouth daily.     pregabalin 100 MG capsule  Commonly known as:  LYRICA  Take one capsule by mouth every eight hours for neuropathic pain R/T DM     PROBIOTIC DAILY Caps  Take 1 capsule by mouth daily. 14 day course starting on 03/08/2014     rosuvastatin 20 MG tablet   Commonly known as:  CRESTOR  Take 20 mg by mouth daily.     sertraline 50 MG tablet  Commonly known as:  ZOLOFT  Take 150 mg by mouth at bedtime.     sucralfate 1 G tablet  Commonly known as:  CARAFATE  Take 1 g by mouth 2 (two) times daily before a meal.     Suvorexant 5 MG Tabs  Take 1 tablet by mouth at bedtime. BELSOMRA 5MG      SYSTANE 0.4-0.3 % Gel  Generic drug:  Polyethyl Glycol-Propyl Glycol  Apply 1 drop  to eye 3 (three) times daily.     Vitamin D (Ergocalciferol) 50000 UNITS Caps capsule  Commonly known as:  DRISDOL  Take 50,000 Units by mouth every 30 (thirty) days. *Administered on the 12th of each month     XARELTO 20 MG Tabs tablet  Generic drug:  rivaroxaban  Take 20 mg by mouth daily.       Allergies  Allergen Reactions  . Morphine And Related Other (See Comments)    Per MAR  . Sulfate        Follow-up Information    Follow up with VYAS,DHRUV B., MD. Schedule an appointment as soon as possible for a visit in 2 weeks.   Specialty:  Internal Medicine   Contact information:   [place] [address] [phone]        The results of significant diagnostics from this hospitalization (including imaging, microbiology, ancillary and laboratory) are listed below for reference.    Significant Diagnostic Studies: Ct Head Wo Contrast  03/19/2014   CLINICAL DATA:  Acute onset of encephalopathy, weakness and hypoxia. Initial encounter.  EXAM: CT HEAD WITHOUT CONTRAST  TECHNIQUE: Contiguous axial images were obtained from the base of the skull through the vertex without intravenous contrast.  COMPARISON:  CT of the head performed 08/12/2013  FINDINGS: There is no evidence of acute infarction, mass lesion, or intra- or extra-axial hemorrhage on CT.  A chronic infarct is noted at the left temporoparietal region, with associated encephalomalacia. Prominence of the ventricles and sulci reflects mild cortical volume loss.  The brainstem and fourth ventricle  are within normal limits. The basal ganglia are unremarkable in appearance. No mass effect or midline shift is seen.  There is no evidence of fracture; visualized osseous structures are unremarkable in appearance. The orbits are within normal limits. The paranasal sinuses and mastoid air cells are well-aerated. No significant soft tissue abnormalities are seen.  IMPRESSION: 1. No acute intracranial pathology seen on CT. 2. Mild cortical volume loss noted. 3. 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   Electronically Signed   By: Garald Balding M.D.   On: 03/19/2014 02:04   Ct Chest W Contrast  02/28/2014   CLINICAL DATA:  Epigastric abdominal pain with decreased appetite and weight loss for 3 months. History of right hemicolectomy. Patient also reports chest pain. Initial encounter.  EXAM: CT CHEST, ABDOMEN, AND PELVIS WITH CONTRAST  TECHNIQUE: Multidetector CT imaging of the chest, abdomen and pelvis was performed following the standard protocol during bolus administration of intravenous contrast.  CONTRAST:  180mL OMNIPAQUE IOHEXOL 300 MG/ML  SOLN  COMPARISON:  Prior CTs 09/22/2006.  FINDINGS: CT CHEST FINDINGS  Mediastinum: Compared with the prior examination, there are few minimally more prominent mediastinal lymph nodes which are not pathologically enlarged. The thyroid gland, trachea and esophagus appear normal. The heart size is normal. Patient is status post median sternotomy and CABG. There is diffuse atherosclerosis of the aorta, great vessels and coronary arteries.  Lungs/Pleura: There is no pleural or pericardial effusion.There are new dependent airspace opacities at both lung bases with associated air bronchograms. In addition, there are multiple irregular focal airspace nodules throughout both lungs. These appear new from prior CT and radiographs done 3 months ago and are probably inflammatory.  Musculoskeletal/Chest wall: No chest wall mass or  suspicious osseous finding.  CT ABDOMEN AND PELVIS FINDINGS  Hepatobiliary: The liver is normal in density without focal abnormality. The gallbladder is surgically  absent. There is no significant biliary dilatation.  Pancreas: Unremarkable. No pancreatic ductal dilatation or surrounding inflammatory changes.  Spleen: Normal in size without focal abnormality.  Adrenals/Urinary Tract: Stable 1.4 cm left adrenal nodule consistent with an adenoma. The right adrenal gland appears normal.Both kidneys demonstrate several low-density lesions which are most consistent with cysts. There is an indeterminate 1.0 cm lesion in the upper interpolar region of the left kidney on image number 30, measuring 79 HU. The ureters and bladder appear unremarkable.  Stomach/Bowel: No evidence of bowel wall thickening, distention or surrounding inflammatory change.There is a 1.4 cm lipoma within the lumen of the stomach on image 15. There are stable postsurgical changes status post right hemicolectomy. There is a large amount of stool within the distal colon.  Vascular/Lymphatic: There are no enlarged abdominal or pelvic lymph nodes. There is extensive atherosclerosis of the aorta, its branches and the iliac arteries. Postsurgical changes are present within both groins.  Reproductive: The prostate gland and seminal vesicles appear unremarkable.  Other: No evidence of abdominal wall mass or hernia.  Musculoskeletal: No acute or significant osseous findings. No evidence of metastatic disease.  IMPRESSION: 1. New extensive nodular airspace opacities throughout both lungs, likely representing atypical infection or drug reaction. Clinical correlation and radiographic follow up recommended. 2. No specific evidence of malignancy/metastatic disease. There are few mildly prominent mediastinal lymph node which are probably reactive. 3. Indeterminate left renal lesion is potentially a complex cyst. However, a small neoplasm cannot be excluded based on  this examination. Once the patient's acute process has resolved, further evaluation with dedicated renal CT or MRI (without and with contrast) recommended. 4. Diffuse atherosclerosis.   Electronically Signed   By: Camie Patience M.D.   On: 02/28/2014 17:03   Ct Abdomen Pelvis W Contrast  02/28/2014   CLINICAL DATA:  Epigastric abdominal pain with decreased appetite and weight loss for 3 months. History of right hemicolectomy. Patient also reports chest pain. Initial encounter.  EXAM: CT CHEST, ABDOMEN, AND PELVIS WITH CONTRAST  TECHNIQUE: Multidetector CT imaging of the chest, abdomen and pelvis was performed following the standard protocol during bolus administration of intravenous contrast.  CONTRAST:  166mL OMNIPAQUE IOHEXOL 300 MG/ML  SOLN  COMPARISON:  Prior CTs 09/22/2006.  FINDINGS: CT CHEST FINDINGS  Mediastinum: Compared with the prior examination, there are few minimally more prominent mediastinal lymph nodes which are not pathologically enlarged. The thyroid gland, trachea and esophagus appear normal. The heart size is normal. Patient is status post median sternotomy and CABG. There is diffuse atherosclerosis of the aorta, great vessels and coronary arteries.  Lungs/Pleura: There is no pleural or pericardial effusion.There are new dependent airspace opacities at both lung bases with associated air bronchograms. In addition, there are multiple irregular focal airspace nodules throughout both lungs. These appear new from prior CT and radiographs done 3 months ago and are probably inflammatory.  Musculoskeletal/Chest wall: No chest wall mass or suspicious osseous finding.  CT ABDOMEN AND PELVIS FINDINGS  Hepatobiliary: The liver is normal in density without focal abnormality. The gallbladder is surgically absent. There is no significant biliary dilatation.  Pancreas: Unremarkable. No pancreatic ductal dilatation or surrounding inflammatory changes.  Spleen: Normal in size without focal abnormality.   Adrenals/Urinary Tract: Stable 1.4 cm left adrenal nodule consistent with an adenoma. The right adrenal gland appears normal.Both kidneys demonstrate several low-density lesions which are most consistent with cysts. There is an indeterminate 1.0 cm lesion in the upper interpolar region of the left kidney  on image number 30, measuring 79 HU. The ureters and bladder appear unremarkable.  Stomach/Bowel: No evidence of bowel wall thickening, distention or surrounding inflammatory change.There is a 1.4 cm lipoma within the lumen of the stomach on image 15. There are stable postsurgical changes status post right hemicolectomy. There is a large amount of stool within the distal colon.  Vascular/Lymphatic: There are no enlarged abdominal or pelvic lymph nodes. There is extensive atherosclerosis of the aorta, its branches and the iliac arteries. Postsurgical changes are present within both groins.  Reproductive: The prostate gland and seminal vesicles appear unremarkable.  Other: No evidence of abdominal wall mass or hernia.  Musculoskeletal: No acute or significant osseous findings. No evidence of metastatic disease.  IMPRESSION: 1. New extensive nodular airspace opacities throughout both lungs, likely representing atypical infection or drug reaction. Clinical correlation and radiographic follow up recommended. 2. No specific evidence of malignancy/metastatic disease. There are few mildly prominent mediastinal lymph node which are probably reactive. 3. Indeterminate left renal lesion is potentially a complex cyst. However, a small neoplasm cannot be excluded based on this examination. Once the patient's acute process has resolved, further evaluation with dedicated renal CT or MRI (without and with contrast) recommended. 4. Diffuse atherosclerosis.   Electronically Signed   By: Camie Patience M.D.   On: 02/28/2014 17:03   Dg Chest Portable 1 View  03/18/2014   CLINICAL DATA:  Chest pain. History of colon cancer, CVA,  coronary artery disease and CABG. Initial encounter.  EXAM: PORTABLE CHEST - 1 VIEW  COMPARISON:  11/17/2013  FINDINGS: Cardiomegaly, pulmonary vascular congestion and moderate pulmonary edema noted.  Bibasilar atelectasis present.  There is no evidence of pneumothorax.  Elevation the right hemidiaphragm again noted.  Median sternotomy wires again identified.  IMPRESSION: Cardiomegaly with moderate pulmonary edema   Electronically Signed   By: Hassan Rowan M.D.   On: 03/18/2014 23:00    Microbiology: Recent Results (from the past 240 hour(s))  MRSA PCR Screening     Status: Abnormal   Collection Time: 03/19/14  2:53 AM  Result Value Ref Range Status   MRSA by PCR POSITIVE (A) NEGATIVE Final    Comment: CRITICAL RESULT CALLED TO, READ BACK BY AND VERIFIED WITH: SHONEWITZ,L AT 9:50AM ON 03/19/14 BY FESTERMAN,C        The GeneXpert MRSA Assay (FDA approved for NASAL specimens only), is one component of a comprehensive MRSA colonization surveillance program. It is not intended to diagnose MRSA infection nor to guide or monitor treatment for MRSA infections.      Labs: Basic Metabolic Panel:  Recent Labs Lab 03/18/14 2317 03/19/14 0238 03/20/14 0615 03/21/14 0600  NA 136* 136* 138 139  K 4.5 4.6 4.6 4.1  CL 95* 96 97 97  CO2 28 25 31 28   GLUCOSE 147* 111* 94 79  BUN 33* 32* 29* 27*  CREATININE 0.88 0.88 0.75 0.76  CALCIUM 9.2 8.8 9.1 8.9   Liver Function Tests:  Recent Labs Lab 03/19/14 0238 03/20/14 0615  AST 13 10  ALT 5 <5  ALKPHOS 45 53  BILITOT 0.4 0.5  PROT 7.1 7.4  ALBUMIN 2.4* 2.7*   No results for input(s): LIPASE, AMYLASE in the last 168 hours.  Recent Labs Lab 03/19/14 0142  AMMONIA 35   CBC:  Recent Labs Lab 03/18/14 2317 03/19/14 0238 03/20/14 0615 03/21/14 0600  WBC 9.5 10.1 9.2 10.0  NEUTROABS 5.0 5.0 4.5  --   HGB 15.6 14.8 15.4 15.1  HCT  46.7 45.6 47.2 46.1  MCV 93.6 93.6 92.9 92.2  PLT 120* 100* 126* 159   Cardiac  Enzymes:  Recent Labs Lab 03/18/14 2317 03/19/14 0238 03/19/14 0851 03/19/14 1421  TROPONINI <0.30 <0.30 <0.30 <0.30   BNP: BNP (last 3 results)  Recent Labs  03/18/14 2317  PROBNP 572.2*   CBG:  Recent Labs Lab 03/19/14 2212 03/20/14 0802 03/20/14 1135 03/20/14 1624 03/20/14 2141  GLUCAP 109* 94 91 102* 121*       Signed:  HERNANDEZ ACOSTA,ESTELA  [facility] Pager: [phone] 03/23/2014, 2:09 PM

## 2014-03-23 NOTE — Progress Notes (Signed)
Report given to [facility] at [facility]. Horris Latino said she would follow up on preferred method of transportation and call back.

## 2014-03-23 NOTE — Plan of Care (Signed)
Problem: Phase II Progression Outcomes Goal: O2 sats > equal to 90% on RA or at baseline Outcome: Adequate for Discharge Goal: Dyspnea controlled w/progressive activity Outcome: Adequate for Discharge Goal: ADLs completed with minimal assistance Outcome: Adequate for Discharge Goal: Activity at appropriate level-compared to baseline (UP IN CHAIR FOR HEMODIALYSIS)  Outcome: Adequate for Discharge Goal: Discharge plan remains appropriate-arrangements made Outcome: Completed/Met Date Met:  03/23/14 Goal: Other Phase II Outcomes/Goals Outcome: Not Applicable Date Met:  73/53/29  Problem: Discharge Progression Outcomes Goal: Dyspnea controlled Outcome: Completed/Met Date Met:  03/23/14 Goal: O2 sats > or equal 90% or at baseline Outcome: Adequate for Discharge Goal: Home O2 if indicated Outcome: Completed/Met Date Met:  03/23/14 Goal: Able to self administer respiratory meds Outcome: Not Applicable Date Met:  92/42/68 Goal: Flu vaccine received if indicated Outcome: Not Applicable Date Met:  34/19/62 Goal: Pneumonia vaccine received if indicated Outcome: Not Applicable Date Met:  22/97/98 Goal: Independent ADLs or Home Health Care Outcome: Completed/Met Date Met:  03/23/14 Goal: Barriers To Progression Addressed/Resolved Outcome: Completed/Met Date Met:  03/23/14 Goal: Discharge plan in place and appropriate Outcome: Completed/Met Date Met:  03/23/14 Goal: Pain controlled with appropriate interventions Outcome: Completed/Met Date Met:  03/23/14 Goal: Tolerating diet Outcome: Completed/Met Date Met:  03/23/14 Goal: Activity appropriate for discharge plan Outcome: Completed/Met Date Met:  03/23/14 Goal: Hemodynamically stable Outcome: Completed/Met Date Met:  92/11/94 Goal: Complications resolved/controlled Outcome: Completed/Met Date Met:  03/23/14 Goal: Other Discharge Outcomes/Goals Outcome: Not Applicable Date Met:  17/40/81

## 2014-03-23 NOTE — Plan of Care (Signed)
Problem: Consults Goal: Skin Care Protocol Initiated - if Braden Score 18 or less If consults are not indicated, leave blank or document N/A  Outcome: Completed/Met Date Met:  03/23/14 Goal: Nutrition Consult-if indicated Outcome: Completed/Met Date Met:  03/23/14 Goal: Diabetes Guidelines if Diabetic/Glucose > 140 If diabetic or lab glucose is > 140 mg/dl - Initiate Diabetes/Hyperglycemia Guidelines & Document Interventions  Outcome: Not Applicable Date Met:  78/67/67  Problem: Phase I Progression Outcomes Goal: Tolerating diet Outcome: Completed/Met Date Met:  03/23/14  Problem: Phase II Progression Outcomes Goal: O2 sats > equal to 90% on RA or at baseline Outcome: Progressing Patient currently 93% on 3.5L [place]. Per sister, patient was able to maintain sats 90% on RA, but has needed O2 in the past when he was diagnosed with PNA. Goal: Pain controlled on oral analgesia Outcome: Completed/Met Date Met:  03/23/14 Goal: Dyspnea controlled w/progressive activity Outcome: Progressing Goal: ADLs completed with minimal assistance Outcome: Progressing Goal: Activity at appropriate level-compared to baseline (UP IN CHAIR FOR HEMODIALYSIS)  Outcome: Progressing Per report from pt's sister, patient was able to get up and walk and perform most ADLs independently at [facility]. He is currently a moderate assist. Goal: Tolerating diet Outcome: Not Applicable Date Met:  20/94/70

## 2014-03-23 NOTE — Progress Notes (Signed)
Patient denies dysphagia or abdominal pain. According to nursing staff he did not experience swallowing difficulty. Abdominal exam reveals soft abdomen without tenderness again or megaly or masses. H. pylori serology is pending. Discussed with Dr. Jerilee Hoh. Agree with discharge planning. Will contact patient's POA with H pylori results. Office visit in 3-4 weeks.

## 2014-03-25 LAB — H. PYLORI ANTIBODY, IGG: H Pylori IgG: 0.61 {ISR}

## 2014-03-26 ENCOUNTER — Encounter (HOSPITAL_COMMUNITY): Payer: Self-pay | Admitting: Internal Medicine

## 2014-03-27 ENCOUNTER — Encounter (HOSPITAL_COMMUNITY): Payer: Self-pay | Admitting: Internal Medicine

## 2014-03-27 ENCOUNTER — Emergency Department (HOSPITAL_COMMUNITY): Payer: Medicare Other

## 2014-03-27 ENCOUNTER — Inpatient Hospital Stay (HOSPITAL_COMMUNITY)
Admission: EM | Admit: 2014-03-27 | Discharge: 2014-04-19 | DRG: 871 | Disposition: E | Payer: Medicare Other | Attending: Internal Medicine | Admitting: Internal Medicine

## 2014-03-27 DIAGNOSIS — Z79899 Other long term (current) drug therapy: Secondary | ICD-10-CM | POA: Diagnosis not present

## 2014-03-27 DIAGNOSIS — E039 Hypothyroidism, unspecified: Secondary | ICD-10-CM | POA: Diagnosis present

## 2014-03-27 DIAGNOSIS — I739 Peripheral vascular disease, unspecified: Secondary | ICD-10-CM | POA: Diagnosis present

## 2014-03-27 DIAGNOSIS — E872 Acidosis, unspecified: Secondary | ICD-10-CM

## 2014-03-27 DIAGNOSIS — Z951 Presence of aortocoronary bypass graft: Secondary | ICD-10-CM | POA: Diagnosis not present

## 2014-03-27 DIAGNOSIS — I2510 Atherosclerotic heart disease of native coronary artery without angina pectoris: Secondary | ICD-10-CM | POA: Diagnosis present

## 2014-03-27 DIAGNOSIS — J9621 Acute and chronic respiratory failure with hypoxia: Secondary | ICD-10-CM | POA: Diagnosis present

## 2014-03-27 DIAGNOSIS — E119 Type 2 diabetes mellitus without complications: Secondary | ICD-10-CM | POA: Diagnosis present

## 2014-03-27 DIAGNOSIS — E871 Hypo-osmolality and hyponatremia: Secondary | ICD-10-CM | POA: Diagnosis present

## 2014-03-27 DIAGNOSIS — R6510 Systemic inflammatory response syndrome (SIRS) of non-infectious origin without acute organ dysfunction: Secondary | ICD-10-CM | POA: Insufficient documentation

## 2014-03-27 DIAGNOSIS — I252 Old myocardial infarction: Secondary | ICD-10-CM | POA: Diagnosis not present

## 2014-03-27 DIAGNOSIS — D509 Iron deficiency anemia, unspecified: Secondary | ICD-10-CM | POA: Diagnosis present

## 2014-03-27 DIAGNOSIS — I69951 Hemiplegia and hemiparesis following unspecified cerebrovascular disease affecting right dominant side: Secondary | ICD-10-CM | POA: Diagnosis not present

## 2014-03-27 DIAGNOSIS — Z85038 Personal history of other malignant neoplasm of large intestine: Secondary | ICD-10-CM

## 2014-03-27 DIAGNOSIS — Z885 Allergy status to narcotic agent status: Secondary | ICD-10-CM

## 2014-03-27 DIAGNOSIS — E669 Obesity, unspecified: Secondary | ICD-10-CM | POA: Diagnosis present

## 2014-03-27 DIAGNOSIS — K2970 Gastritis, unspecified, without bleeding: Secondary | ICD-10-CM | POA: Diagnosis present

## 2014-03-27 DIAGNOSIS — G4733 Obstructive sleep apnea (adult) (pediatric): Secondary | ICD-10-CM | POA: Diagnosis present

## 2014-03-27 DIAGNOSIS — Z7901 Long term (current) use of anticoagulants: Secondary | ICD-10-CM

## 2014-03-27 DIAGNOSIS — R569 Unspecified convulsions: Secondary | ICD-10-CM

## 2014-03-27 DIAGNOSIS — Z833 Family history of diabetes mellitus: Secondary | ICD-10-CM

## 2014-03-27 DIAGNOSIS — Z79891 Long term (current) use of opiate analgesic: Secondary | ICD-10-CM

## 2014-03-27 DIAGNOSIS — G4700 Insomnia, unspecified: Secondary | ICD-10-CM | POA: Diagnosis present

## 2014-03-27 DIAGNOSIS — F419 Anxiety disorder, unspecified: Secondary | ICD-10-CM | POA: Diagnosis present

## 2014-03-27 DIAGNOSIS — E782 Mixed hyperlipidemia: Secondary | ICD-10-CM | POA: Diagnosis present

## 2014-03-27 DIAGNOSIS — I5043 Acute on chronic combined systolic (congestive) and diastolic (congestive) heart failure: Secondary | ICD-10-CM | POA: Diagnosis present

## 2014-03-27 DIAGNOSIS — E46 Unspecified protein-calorie malnutrition: Secondary | ICD-10-CM | POA: Diagnosis present

## 2014-03-27 DIAGNOSIS — I10 Essential (primary) hypertension: Secondary | ICD-10-CM | POA: Diagnosis present

## 2014-03-27 DIAGNOSIS — E876 Hypokalemia: Secondary | ICD-10-CM | POA: Diagnosis present

## 2014-03-27 DIAGNOSIS — M4640 Discitis, unspecified, site unspecified: Secondary | ICD-10-CM | POA: Diagnosis present

## 2014-03-27 DIAGNOSIS — M4644 Discitis, unspecified, thoracic region: Secondary | ICD-10-CM | POA: Insufficient documentation

## 2014-03-27 DIAGNOSIS — G40909 Epilepsy, unspecified, not intractable, without status epilepticus: Secondary | ICD-10-CM | POA: Diagnosis present

## 2014-03-27 DIAGNOSIS — Z882 Allergy status to sulfonamides status: Secondary | ICD-10-CM | POA: Diagnosis not present

## 2014-03-27 DIAGNOSIS — G9340 Encephalopathy, unspecified: Secondary | ICD-10-CM | POA: Diagnosis present

## 2014-03-27 DIAGNOSIS — J9690 Respiratory failure, unspecified, unspecified whether with hypoxia or hypercapnia: Secondary | ICD-10-CM

## 2014-03-27 DIAGNOSIS — Z87891 Personal history of nicotine dependence: Secondary | ICD-10-CM | POA: Diagnosis not present

## 2014-03-27 DIAGNOSIS — Z8711 Personal history of peptic ulcer disease: Secondary | ICD-10-CM | POA: Diagnosis not present

## 2014-03-27 DIAGNOSIS — Y95 Nosocomial condition: Secondary | ICD-10-CM | POA: Diagnosis present

## 2014-03-27 DIAGNOSIS — K219 Gastro-esophageal reflux disease without esophagitis: Secondary | ICD-10-CM | POA: Diagnosis present

## 2014-03-27 DIAGNOSIS — I480 Paroxysmal atrial fibrillation: Secondary | ICD-10-CM | POA: Diagnosis present

## 2014-03-27 DIAGNOSIS — A419 Sepsis, unspecified organism: Secondary | ICD-10-CM | POA: Diagnosis present

## 2014-03-27 DIAGNOSIS — J189 Pneumonia, unspecified organism: Secondary | ICD-10-CM | POA: Diagnosis present

## 2014-03-27 DIAGNOSIS — Z515 Encounter for palliative care: Secondary | ICD-10-CM

## 2014-03-27 DIAGNOSIS — J80 Acute respiratory distress syndrome: Secondary | ICD-10-CM | POA: Insufficient documentation

## 2014-03-27 DIAGNOSIS — J9601 Acute respiratory failure with hypoxia: Secondary | ICD-10-CM | POA: Diagnosis present

## 2014-03-27 DIAGNOSIS — R0600 Dyspnea, unspecified: Secondary | ICD-10-CM

## 2014-03-27 DIAGNOSIS — Z66 Do not resuscitate: Secondary | ICD-10-CM | POA: Diagnosis present

## 2014-03-27 DIAGNOSIS — R0602 Shortness of breath: Secondary | ICD-10-CM | POA: Diagnosis present

## 2014-03-27 DIAGNOSIS — E785 Hyperlipidemia, unspecified: Secondary | ICD-10-CM | POA: Diagnosis present

## 2014-03-27 LAB — BLOOD GAS, ARTERIAL
ACID-BASE EXCESS: 1.2 mmol/L (ref 0.0–2.0)
BICARBONATE: 25.2 meq/L — AB (ref 20.0–24.0)
FIO2: 100 %
O2 Saturation: 70.7 %
PCO2 ART: 39.6 mmHg (ref 35.0–45.0)
Patient temperature: 37
TCO2: 22.1 mmol/L (ref 0–100)
pH, Arterial: 7.42 (ref 7.350–7.450)
pO2, Arterial: 40.9 mmHg — ABNORMAL LOW (ref 80.0–100.0)

## 2014-03-27 LAB — URINALYSIS, ROUTINE W REFLEX MICROSCOPIC
Glucose, UA: 100 mg/dL — AB
HGB URINE DIPSTICK: NEGATIVE
Leukocytes, UA: NEGATIVE
NITRITE: NEGATIVE
PH: 5.5 (ref 5.0–8.0)
Protein, ur: NEGATIVE mg/dL
SPECIFIC GRAVITY, URINE: 1.02 (ref 1.005–1.030)
Urobilinogen, UA: 2 mg/dL — ABNORMAL HIGH (ref 0.0–1.0)

## 2014-03-27 LAB — CBC WITH DIFFERENTIAL/PLATELET
BASOS ABS: 0 10*3/uL (ref 0.0–0.1)
Basophils Relative: 0 % (ref 0–1)
Eosinophils Absolute: 0 10*3/uL (ref 0.0–0.7)
Eosinophils Relative: 0 % (ref 0–5)
HCT: 47 % (ref 39.0–52.0)
HEMOGLOBIN: 15.3 g/dL (ref 13.0–17.0)
Lymphocytes Relative: 8 % — ABNORMAL LOW (ref 12–46)
Lymphs Abs: 2.2 10*3/uL (ref 0.7–4.0)
MCH: 30.4 pg (ref 26.0–34.0)
MCHC: 32.6 g/dL (ref 30.0–36.0)
MCV: 93.3 fL (ref 78.0–100.0)
Monocytes Absolute: 2.6 10*3/uL — ABNORMAL HIGH (ref 0.1–1.0)
Monocytes Relative: 9 % (ref 3–12)
NEUTROS ABS: 24.1 10*3/uL — AB (ref 1.7–7.7)
NEUTROS PCT: 84 % — AB (ref 43–77)
Platelets: 190 10*3/uL (ref 150–400)
RBC: 5.04 MIL/uL (ref 4.22–5.81)
RDW: 16.5 % — AB (ref 11.5–15.5)
WBC: 28.9 10*3/uL — ABNORMAL HIGH (ref 4.0–10.5)

## 2014-03-27 LAB — I-STAT CG4 LACTIC ACID, ED
LACTIC ACID, VENOUS: 4.35 mmol/L — AB (ref 0.5–2.2)
LACTIC ACID, VENOUS: 4.29 mmol/L — AB (ref 0.5–2.2)

## 2014-03-27 LAB — COMPREHENSIVE METABOLIC PANEL
ALBUMIN: 2.3 g/dL — AB (ref 3.5–5.2)
ALK PHOS: 39 U/L (ref 39–117)
ALT: 5 U/L (ref 0–53)
AST: 21 U/L (ref 0–37)
Anion gap: 15 (ref 5–15)
BILIRUBIN TOTAL: 0.5 mg/dL (ref 0.3–1.2)
BUN: 30 mg/dL — ABNORMAL HIGH (ref 6–23)
CO2: 25 mEq/L (ref 19–32)
Calcium: 8.5 mg/dL (ref 8.4–10.5)
Chloride: 100 mEq/L (ref 96–112)
Creatinine, Ser: 1.12 mg/dL (ref 0.50–1.35)
GFR calc Af Amer: 76 mL/min — ABNORMAL LOW (ref >90)
GFR calc non Af Amer: 66 mL/min — ABNORMAL LOW (ref >90)
Glucose, Bld: 208 mg/dL — ABNORMAL HIGH (ref 70–99)
POTASSIUM: 4.8 meq/L (ref 3.7–5.3)
SODIUM: 140 meq/L (ref 137–147)
Total Protein: 6.2 g/dL (ref 6.0–8.3)

## 2014-03-27 LAB — PRO B NATRIURETIC PEPTIDE: PRO B NATRI PEPTIDE: 2710 pg/mL — AB (ref 0–125)

## 2014-03-27 LAB — TROPONIN I

## 2014-03-27 LAB — INFLUENZA PANEL BY PCR (TYPE A & B)
H1N1 flu by pcr: NOT DETECTED
INFLAPCR: NEGATIVE
INFLBPCR: NEGATIVE

## 2014-03-27 MED ORDER — SODIUM CHLORIDE 0.9 % IV BOLUS (SEPSIS)
500.0000 mL | INTRAVENOUS | Status: AC
  Administered 2014-03-27: 500 mL via INTRAVENOUS

## 2014-03-27 MED ORDER — INSULIN ASPART 100 UNIT/ML ~~LOC~~ SOLN: 0.0000 [IU] | Freq: Three times a day (TID) | SUBCUTANEOUS | Status: DC

## 2014-03-27 MED ORDER — ONDANSETRON HCL 4 MG PO TABS: 4.0000 mg | ORAL_TABLET | Freq: Four times a day (QID) | ORAL | Status: DC | PRN

## 2014-03-27 MED ORDER — POTASSIUM CHLORIDE CRYS ER 20 MEQ PO TBCR: 20.0000 meq | EXTENDED_RELEASE_TABLET | Freq: Every day | ORAL | Status: DC

## 2014-03-27 MED ORDER — VANCOMYCIN HCL IN DEXTROSE 1-5 GM/200ML-% IV SOLN
1000.0000 mg | Freq: Once | INTRAVENOUS | Status: DC
  Filled 2014-03-27: qty 200

## 2014-03-27 MED ORDER — DIVALPROEX SODIUM 250 MG PO DR TAB: 500.0000 mg | DELAYED_RELEASE_TABLET | Freq: Three times a day (TID) | ORAL | Status: DC

## 2014-03-27 MED ORDER — ONDANSETRON HCL 4 MG/2ML IJ SOLN: 4.0000 mg | Freq: Four times a day (QID) | INTRAMUSCULAR | Status: DC | PRN

## 2014-03-27 MED ORDER — METOPROLOL TARTRATE 25 MG PO TABS: 25.0000 mg | ORAL_TABLET | Freq: Two times a day (BID) | ORAL | Status: DC

## 2014-03-27 MED ORDER — ALPRAZOLAM 0.5 MG PO TABS: 0.5000 mg | ORAL_TABLET | Freq: Three times a day (TID) | ORAL | Status: DC

## 2014-03-27 MED ORDER — CEFEPIME HCL 2 G IJ SOLR
2.0000 g | Freq: Once | INTRAMUSCULAR | Status: AC
  Administered 2014-03-27: 2 g via INTRAVENOUS
  Filled 2014-03-27 (×2): qty 2

## 2014-03-27 MED ORDER — ACETAMINOPHEN 650 MG RE SUPP
650.0000 mg | Freq: Four times a day (QID) | RECTAL | Status: DC | PRN
  Administered 2014-04-03: 650 mg via RECTAL
  Filled 2014-03-27 (×2): qty 1

## 2014-03-27 MED ORDER — SODIUM CHLORIDE 0.9 % IV SOLN
INTRAVENOUS | Status: DC
  Administered 2014-03-28 – 2014-03-29 (×5): via INTRAVENOUS

## 2014-03-27 MED ORDER — LEVOTHYROXINE SODIUM 100 MCG PO TABS
175.0000 ug | ORAL_TABLET | Freq: Every day | ORAL | Status: DC
  Filled 2014-03-27 (×2): qty 1

## 2014-03-27 MED ORDER — VANCOMYCIN HCL IN DEXTROSE 1-5 GM/200ML-% IV SOLN
1000.0000 mg | Freq: Two times a day (BID) | INTRAVENOUS | Status: DC
  Administered 2014-03-27 – 2014-03-29 (×4): 1000 mg via INTRAVENOUS
  Filled 2014-03-27 (×9): qty 200

## 2014-03-27 MED ORDER — DEXTROSE 5 % IV SOLN
1.0000 g | Freq: Three times a day (TID) | INTRAVENOUS | Status: DC
  Filled 2014-03-27 (×2): qty 1

## 2014-03-27 MED ORDER — OXYCODONE HCL 5 MG PO TABS: 7.5000 mg | ORAL_TABLET | Freq: Four times a day (QID) | ORAL | Status: DC | PRN

## 2014-03-27 MED ORDER — FUROSEMIDE 40 MG PO TABS: 40.0000 mg | ORAL_TABLET | Freq: Every day | ORAL | Status: DC

## 2014-03-27 MED ORDER — SODIUM CHLORIDE 0.9 % IJ SOLN
3.0000 mL | Freq: Two times a day (BID) | INTRAMUSCULAR | Status: DC
  Administered 2014-03-28 – 2014-04-04 (×14): 3 mL via INTRAVENOUS

## 2014-03-27 MED ORDER — PANTOPRAZOLE SODIUM 40 MG PO TBEC: 40.0000 mg | DELAYED_RELEASE_TABLET | Freq: Two times a day (BID) | ORAL | Status: DC

## 2014-03-27 MED ORDER — ACETAMINOPHEN 325 MG PO TABS: 650.0000 mg | ORAL_TABLET | Freq: Four times a day (QID) | ORAL | Status: DC | PRN

## 2014-03-27 MED ORDER — SODIUM CHLORIDE 0.9 % IV BOLUS (SEPSIS)
1000.0000 mL | INTRAVENOUS | Status: AC
  Administered 2014-03-27 (×2): 1000 mL via INTRAVENOUS

## 2014-03-27 MED ORDER — DEXTROSE 5 % IV SOLN
1.0000 g | Freq: Three times a day (TID) | INTRAVENOUS | Status: DC
  Administered 2014-03-28 – 2014-03-31 (×11): 1 g via INTRAVENOUS
  Filled 2014-03-27 (×21): qty 1

## 2014-03-27 MED ORDER — RIVAROXABAN 20 MG PO TABS
20.0000 mg | ORAL_TABLET | Freq: Every day | ORAL | Status: DC
  Filled 2014-03-27 (×2): qty 1

## 2014-03-27 NOTE — H&P (Signed)
[facility] History and Physical  GRIFFIN GERRARD LNL:[id number] DOB: 1946-03-02 DOA: 03/25/2014  Referring physician: Dr Betsey Holiday - [facility] PCP: Glenda Chroman., MD   Chief Complaint: AMS SOB  HPI: [name] is a 68 y.o. male  EMS called to pts NH due to AMS and difficulty breathing. Upon arrival pt temp to 102.2, O2 sats to 70 on non rebreather, and agitated. Placed on BIPAP w/ some improvement in O2 sats to 100% and reduction in respiratory rate.   LEVEL 5 CAVEAT - pt unable to participate in admission infromation gathering. Unable to stay awake for more than a few seconds and unable give decernable answers while wearing BIPAP.   During brief wake episodes, pt was able to endorse SOB but no CP.   Review of Systems:  Due to pts mental status a full ROS was not able to be obtained.     Past Medical History  Diagnosis Date  . PVD (peripheral vascular disease)     Severe lower extremitiy  . Type 2 diabetes mellitus   . Mixed hyperlipidemia   . Obesity   . Seizure disorder   . OSA (obstructive sleep apnea)   . CVA (cerebrovascular accident) 2004    Persistent residual expressive aphasia  . Coronary atherosclerosis of native coronary artery     Multivessel  . Chest discomfort     Chronic on narcotics - Dr. Woody Seller  . Cervical spine disease   . Iron deficiency anemia     Managed by Dr. Marin Olp  . PAF (paroxysmal atrial fibrillation)   . Carotid artery disease   . GERD (gastroesophageal reflux disease)   . Myocardial infarction   . Colon cancer 2007   Past Surgical History  Procedure Laterality Date  . Carotid endarterectomy      Left  . Partial colectomy      2007  . Coronary artery bypass graft  05/2006    LIMA to diagonal, RIMA to LAD, radial to OM, radial to PDA  . Left shoulder surgery    . Cholecystectomy    . Femoral artery - popliteal artery bypass graft      Bilateral  . Tonsillectomy and adenoidectomy    . Esophagogastroduodenoscopy N/A 03/22/2014   Procedure: ESOPHAGOGASTRODUODENOSCOPY (EGD) Esophageal dilation;  Surgeon: Rogene Houston, MD;  Location[facility];  Service: Endoscopy;  Laterality: N/A;  Venia Minks dilation N/A 03/22/2014    Procedure: Venia Minks DILATION;  Surgeon: Rogene Houston, MD;  Location: [facility];  Service: Endoscopy;  Laterality: N/A;   Social History:  reports that he quit smoking about 23 years ago. His smoking use included Cigarettes. He has a 25 pack-year smoking history. He has never used smokeless tobacco. He reports that he does not drink alcohol or use illicit drugs.  Allergies  Allergen Reactions  . Morphine And Related Other (See Comments)    Per MAR  . Sulfate     Family History  Problem Relation Age of Onset  . Diabetes Mother      Prior to Admission medications   Medication Sig Start Date End Date Taking? Authorizing Provider  acetaminophen (TYLENOL) 325 MG tablet Take 650 mg by mouth every 4 (four) hours as needed for mild pain.   Yes Historical Provider, MD  ALPRAZolam Duanne Moron) 0.5 MG tablet Take 0.5 mg by mouth 3 (three) times daily.   Yes Historical Provider, MD  amLODipine (NORVASC) 2.5 MG tablet Take 2.5 mg by mouth daily.   Yes Historical Provider,  MD  divalproex (DEPAKOTE) 500 MG DR tablet Take 500 mg by mouth 3 (three) times daily.    Yes Historical Provider, MD  folic acid (FOLVITE) 1 MG tablet Take 1 mg by mouth daily.   Yes Historical Provider, MD  furosemide (LASIX) 40 MG tablet Take 1 tablet (40 mg total) by mouth daily. 01/21/12  Yes Eugene C Serpe, PA-C  ipratropium-albuterol (DUONEB) 0.5-2.5 (3) MG/3ML SOLN Take 3 mLs by nebulization every 6 (six) hours as needed (for shortness of breath/wheezing).   Yes Historical Provider, MD  isosorbide dinitrate (ISORDIL) 20 MG tablet Take 60 mg by mouth 3 (three) times daily.   Yes Historical Provider, MD  levothyroxine (SYNTHROID, LEVOTHROID) 175 MCG tablet Take 175 mcg by mouth daily before breakfast.   Yes Historical Provider, MD    metoprolol tartrate (LOPRESSOR) 25 MG tablet Take 25 mg by mouth 2 (two) times daily.   Yes Historical Provider, MD  Olopatadine HCl (PATADAY) 0.2 % SOLN Apply 1 drop to eye every morning.   Yes Historical Provider, MD  pantoprazole (PROTONIX) 40 MG tablet Take 40 mg by mouth 2 (two) times daily.   Yes Historical Provider, MD  Polyethyl Glycol-Propyl Glycol (SYSTANE) 0.4-0.3 % GEL Apply 1 drop to eye 3 (three) times daily.   Yes Historical Provider, MD  potassium chloride SA (K-DUR,KLOR-CON) 20 MEQ tablet Take 20 mEq by mouth daily.   Yes Historical Provider, MD  pregabalin (LYRICA) 100 MG capsule Take one capsule by mouth every eight hours for neuropathic pain R/T DM 02/18/14  Yes Tiffany L Reed, DO  Rivaroxaban (XARELTO) 20 MG TABS tablet Take 20 mg by mouth daily with supper.    Yes Historical Provider, MD  rosuvastatin (CRESTOR) 20 MG tablet Take 20 mg by mouth daily.   Yes Historical Provider, MD  sertraline (ZOLOFT) 50 MG tablet Take 150 mg by mouth at bedtime.   Yes Historical Provider, MD  sucralfate (CARAFATE) 1 G tablet Take 1 g by mouth 2 (two) times daily before a meal.   Yes Historical Provider, MD  Vitamin D, Ergocalciferol, (DRISDOL) 50000 UNITS CAPS capsule Take 50,000 Units by mouth every 30 (thirty) days. *Administered on the 12th of each month   Yes Historical Provider, MD  loperamide (IMODIUM) 2 MG capsule Take 2 mg by mouth as needed for diarrhea or loose stools.    Historical Provider, MD  nitroGLYCERIN (NITROSTAT) 0.4 MG SL tablet Place 0.4 mg under the tongue every 5 (five) minutes as needed for chest pain.    Historical Provider, MD  oxyCODONE (ROXICODONE) 15 MG immediate release tablet Take 7.5 mg by mouth every 6 (six) hours as needed for pain (*Only given as needed for pain greater than 5).    Historical Provider, MD  Suvorexant 5 MG TABS Take 1 tablet by mouth at bedtime. BELSOMRA 5MG     Historical Provider, MD   Physical Exam: Filed Vitals:   04/07/2014 2045 03/23/2014  2100 03/26/2014 2115 04/01/2014 2130  BP: 121/64 107/63 112/63 106/62  Pulse: 89 88 87 86  Temp:    100.1 F (37.8 C)  TempSrc:    Core (Comment)  Resp: 26 23 25 26   Height:      Weight:      SpO2: 100% 100% 100% 97%    Wt Readings from Last 3 Encounters:  03/19/2014 80.74 kg (178 lb)  03/22/14 82.2 kg (181 lb 3.5 oz)  02/13/14 87.544 kg (193 lb)    General: in clear distress w/ BIPAP on  and waking intermittently when stimulated Eyes:  PERRL, normal lids, irises & conjunctiva ENT: Dry mm,  grossly normal hearing, Neck:  no LAD, FROM Cardiovascular:  RRR, no m/r/g. No LE edema. Telemetry:  SR, no arrhythmias  Respiratory:  Crackles in lung bases bilat R>L, on BIPAP  Abdomen:  soft, ntnd, NABS Skin:  no rash or induration seen on limited exam Musculoskeletal:  grossly normal tone BUE/BLE Psychiatric: Pt only wakes briefly for questioning, but answers questions appropriately Neurologic: Moves all extremities           Labs on Admission:  Basic Metabolic Panel:  Recent Labs Lab 03/21/14 0600 03/22/2014 1935  NA 139 140  K 4.1 4.8  CL 97 100  CO2 28 25  GLUCOSE 79 208*  BUN 27* 30*  CREATININE 0.76 1.12  CALCIUM 8.9 8.5   Liver Function Tests:  Recent Labs Lab 04/18/2014 1935  AST 21  ALT 5  ALKPHOS 39  BILITOT 0.5  PROT 6.2  ALBUMIN 2.3*   No results for input(s): LIPASE, AMYLASE in the last 168 hours. No results for input(s): AMMONIA in the last 168 hours. CBC:  Recent Labs Lab 03/21/14 0600 04/10/2014 1818  WBC 10.0 28.9*  NEUTROABS  --  24.1*  HGB 15.1 15.3  HCT 46.1 47.0  MCV 92.2 93.3  PLT 159 190   Cardiac Enzymes:  Recent Labs Lab 03/24/2014 1935  TROPONINI <0.30    BNP (last 3 results)  Recent Labs  03/18/14 2317 04/09/2014 1935  PROBNP 572.2* 2710.0*   CBG: No results for input(s): GLUCAP in the last 168 hours.  Radiological Exams on Admission: Dg Chest Port 1 View  04/16/2014   CLINICAL DATA:  Initial encounter for Respiratory  distress beginning today.  EXAM: PORTABLE CHEST - 1 VIEW  COMPARISON:  03/18/2014.  FINDINGS: 1830 hrs. The cardio pericardial silhouette is enlarged. The bilateral diffuse airspace disease seen previously persists without substantial interval change. Lung bases are slightly better aerated than they were on the previous study. Small bilateral pleural effusions are associated. Telemetry leads overlie the chest.  IMPRESSION: Cardiomegaly with extensive bilateral airspace disease, not substantially changed in the interval.   Electronically Signed   By: Misty Stanley M.D.   On: 04/17/2014 18:45    EKG: Independently reviewed. SInus tach, PVCs, no sign of ACS  Assessment/Plan Principal Problem:   Acute respiratory failure with hypoxia Active Problems:   Essential hypertension, benign   Hyperlipidemia   Obstructive sleep apnea   Paroxysmal atrial fibrillation   Encephalopathy   SIRS (systemic inflammatory response syndrome)   HCAP (healthcare-associated pneumonia)   Lactic acidosis   Well controlled type 2 diabetes mellitus   Seizures  68 yo M w/ PMH of PVD, DM II, HLD, SZRs, OSA, h/o CVA 2004, CAD and MI, anemia, PAF, GERD, and colon cancer presenting w/ acute respiratory failure  Acute Respiratory Failure: likely secondary pneumonia - HCAP. ABG 7.4, O2 40, CO2 39.6, Bicarb 25.2. Lacitc Acid 4.35. WBC 28.9. Febrile to 102. Stabilized on BIPAP.  - Admit to Stepdown - Treat as HCAP given recent hospitalization  - Influenza panel - legionella and strep - Cx - Cefepime and Vanc - ABG in am - NS 55ml/hr  HTN: hypotensive on admission - hole home isosorbide, norvasc,  - continue metop given h/o Afib  Seizures: unsure of last seizure:  - continue Depakote  Diastolic CHF: 56/4/33 Echo EF 60-65% and Grade 1 diastolic dysfunction. Pro BNP elevated to 2710 (previous 572). Pt well  compensated.  - Continue home lasix (40mg  PO) 20mg  IV - Monitor closely for fluid overload  DM II: assume diet  controlled as not medications listed and pt unable to communicate. Last A1c 6.9 in December - SSI  Afib: sinus tach on presentation. Troponin neg.  - continue xarelto  Insomnia:  - hold home suvorexant due to current mental state  Anxiety: - continue PRN xanax Hypothyroidism: - continue synthroid  HLD: - continue Crestor  Code Status: FULL DVT Prophylaxis: Xarelto Family Communication: none present Disposition Plan: pending improvement  MERRELL, [facility] [email or web address] Password [email or web address]

## 2014-03-27 NOTE — ED Notes (Signed)
Called facility to verify immunizations. Pt received flu vaccine on 02/14/2014 per staff at [facility].

## 2014-03-27 NOTE — ED Notes (Signed)
RT placed pt on bipap

## 2014-03-27 NOTE — Progress Notes (Signed)
Pt transferred from ED to [place] on BIPAP no problem and pt in no distress.

## 2014-03-27 NOTE — Progress Notes (Signed)
ANTIBIOTIC CONSULT NOTE - INITIAL  Pharmacy Consult for Vancomycin & Cefepime Indication: pneumonia  Allergies  Allergen Reactions  . Morphine And Related Other (See Comments)    Per MAR  . Sulfate     Patient Measurements: Height: 5\' 9"  (175.3 cm) Weight: 178 lb (80.74 kg) IBW/kg (Calculated) : 70.7 Adjusted Body Weight:   Vital Signs: Temp: 102.2 F (39 C) (12/09 1805) Temp Source: Rectal (12/09 1805) BP: 96/64 mmHg (12/09 1805) Pulse Rate: 90 (12/09 1805) Intake/Output from previous day:   Intake/Output from this shift:    Labs: No results for input(s): WBC, HGB, PLT, LABCREA, CREATININE in the last 72 hours. Estimated Creatinine Clearance: 88.4 mL/min (by C-G formula based on Cr of 0.76). No results for input(s): VANCOTROUGH, VANCOPEAK, VANCORANDOM, GENTTROUGH, GENTPEAK, GENTRANDOM, TOBRATROUGH, TOBRAPEAK, TOBRARND, AMIKACINPEAK, AMIKACINTROU, AMIKACIN in the last 72 hours.   Microbiology: Recent Results (from the past 720 hour(s))  MRSA PCR Screening     Status: Abnormal   Collection Time: 03/19/14  2:53 AM  Result Value Ref Range Status   MRSA by PCR POSITIVE (A) NEGATIVE Final    Comment: CRITICAL RESULT CALLED TO, READ BACK BY AND VERIFIED WITH: SHONEWITZ,L AT 9:50AM ON 03/19/14 BY FESTERMAN,C        The GeneXpert MRSA Assay (FDA approved for NASAL specimens only), is one component of a comprehensive MRSA colonization surveillance program. It is not intended to diagnose MRSA infection nor to guide or monitor treatment for MRSA infections.     Medical History: Past Medical History  Diagnosis Date  . PVD (peripheral vascular disease)     Severe lower extremitiy  . Type 2 diabetes mellitus   . Mixed hyperlipidemia   . Obesity   . Seizure disorder   . OSA (obstructive sleep apnea)   . CVA (cerebrovascular accident) 2004    Persistent residual expressive aphasia  . Coronary atherosclerosis of native coronary artery     Multivessel  . Chest  discomfort     Chronic on narcotics - Dr. Woody Seller  . Cervical spine disease   . Iron deficiency anemia     Managed by Dr. Marin Olp  . PAF (paroxysmal atrial fibrillation)   . Carotid artery disease   . GERD (gastroesophageal reflux disease)   . Myocardial infarction   . Colon cancer 2007    Medications:   (Not in a hospital admission) Assessment: Patient from [facility] home Cefepime 2 GM IV and Vancomycin 1 GM IV ordered in ED  Goal of Therapy:  Vancomycin trough level 15-20 mcg/ml  Plan:  Vancomycin 1 GM IV every 12 hours Cefepime 1 GM IV every 8 hours Vancomycin trough at steady state Labs per protocol  [name], [name] 03/21/2014,6:18 PM

## 2014-03-27 NOTE — ED Provider Notes (Signed)
CSN: [id number]     Arrival date & time 04/01/2014  1749 History  This chart was scribed for Orpah Greek, * by Einar Pheasant, ED Scribe. This patient was seen in room APA02/APA02 and the patient's care was started at 6:03 PM.     Chief Complaint  Patient presents with  . Shortness of Breath  . Fever   The history is provided by the patient. The history is limited by the condition of the patient. No language interpreter was used.   Level 5 Caveat--pt is agitated and not answering questions properly HPI Comments: [name] is a 68 y.o. male who presents to the Emergency Department complaining of SOB and a fever. Pt is unable to respond to questions at this time secondary to his condition. He was sent over here by nursing home to be evaluated for fever and SOB. Current ED temperature is 102.2. Pt is agitated and not responding to questions asked.   Past Medical History  Diagnosis Date  . PVD (peripheral vascular disease)     Severe lower extremitiy  . Type 2 diabetes mellitus   . Mixed hyperlipidemia   . Obesity   . Seizure disorder   . OSA (obstructive sleep apnea)   . CVA (cerebrovascular accident) 2004    Persistent residual expressive aphasia  . Coronary atherosclerosis of native coronary artery     Multivessel  . Chest discomfort     Chronic on narcotics - Dr. Woody Seller  . Cervical spine disease   . Iron deficiency anemia     Managed by Dr. Marin Olp  . PAF (paroxysmal atrial fibrillation)   . Carotid artery disease   . GERD (gastroesophageal reflux disease)   . Myocardial infarction   . Colon cancer 2007   Past Surgical History  Procedure Laterality Date  . Carotid endarterectomy      Left  . Partial colectomy      2007  . Coronary artery bypass graft  05/2006    LIMA to diagonal, RIMA to LAD, radial to OM, radial to PDA  . Left shoulder surgery    . Cholecystectomy    . Femoral artery - popliteal artery bypass graft      Bilateral  . Tonsillectomy and  adenoidectomy    . Esophagogastroduodenoscopy N/A 03/22/2014    Procedure: ESOPHAGOGASTRODUODENOSCOPY (EGD) Esophageal dilation;  Surgeon: Rogene Houston, MD;  Location: [facility];  Service: Endoscopy;  Laterality: N/A;  Venia Minks dilation N/A 03/22/2014    Procedure: Venia Minks DILATION;  Surgeon: Rogene Houston, MD;  Location: [facility];  Service: Endoscopy;  Laterality: N/A;   Family History  Problem Relation Age of Onset  . Diabetes Mother    History  Substance Use Topics  . Smoking status: Former Smoker -- 1.00 packs/day for 25 years    Types: Cigarettes    Quit date: 10/14/1990  . Smokeless tobacco: Never Used  . Alcohol Use: No    Review of Systems  Unable to perform ROS: Patient nonverbal  Constitutional: Positive for fever.  Respiratory: Positive for shortness of breath.   All other systems reviewed and are negative.  Allergies  Morphine and related and Sulfate  Home Medications   Prior to Admission medications   Medication Sig Start Date End Date Taking? Authorizing Provider  ALPRAZolam Duanne Moron) 0.5 MG tablet Take 0.5 mg by mouth 3 (three) times daily.    Historical Provider, MD  amLODipine (NORVASC) 2.5 MG tablet Take 2.5 mg by mouth daily.  Historical Provider, MD  Difluprednate (DUREZOL) 0.05 % EMUL Apply 1 drop to eye every morning.    Historical Provider, MD  divalproex (DEPAKOTE) 500 MG DR tablet Take 500 mg by mouth 3 (three) times daily.     Historical Provider, MD  folic acid (FOLVITE) 1 MG tablet Take 1 mg by mouth daily.    Historical Provider, MD  furosemide (LASIX) 40 MG tablet Take 1 tablet (40 mg total) by mouth daily. 01/21/12   Donney Dice, PA-C  ipratropium-albuterol (DUONEB) 0.5-2.5 (3) MG/3ML SOLN Take 3 mLs by nebulization every 6 (six) hours as needed (for shortness of breath/wheezing).    Historical Provider, MD  isosorbide dinitrate (ISORDIL) 20 MG tablet Take 60 mg by mouth 3 (three) times daily.    Historical Provider, MD   levothyroxine (SYNTHROID, LEVOTHROID) 175 MCG tablet Take 175 mcg by mouth daily before breakfast.    Historical Provider, MD  loperamide (IMODIUM) 2 MG capsule Take 2 mg by mouth as needed for diarrhea or loose stools.    Historical Provider, MD  metoprolol tartrate (LOPRESSOR) 25 MG tablet Take 25 mg by mouth 2 (two) times daily.    Historical Provider, MD  nitroGLYCERIN (NITROSTAT) 0.4 MG SL tablet Place 0.4 mg under the tongue every 5 (five) minutes as needed for chest pain.    Historical Provider, MD  Olopatadine HCl (PATADAY) 0.2 % SOLN Apply 1 drop to eye every morning.    Historical Provider, MD  oxyCODONE (ROXICODONE) 15 MG immediate release tablet Take 7.5 mg by mouth every 6 (six) hours as needed for pain (*Only given as needed for pain greater than 5).    Historical Provider, MD  pantoprazole (PROTONIX) 40 MG tablet Take 40 mg by mouth 2 (two) times daily.    Historical Provider, MD  Polyethyl Glycol-Propyl Glycol (SYSTANE) 0.4-0.3 % GEL Apply 1 drop to eye 3 (three) times daily.    Historical Provider, MD  potassium chloride SA (K-DUR,KLOR-CON) 20 MEQ tablet Take 20 mEq by mouth daily.    Historical Provider, MD  pregabalin (LYRICA) 100 MG capsule Take one capsule by mouth every eight hours for neuropathic pain R/T DM 02/18/14   Tiffany L Reed, DO  Probiotic Product (PROBIOTIC DAILY) CAPS Take 1 capsule by mouth daily. 14 day course starting on 03/08/2014    Historical Provider, MD  Rivaroxaban (XARELTO) 20 MG TABS tablet Take 20 mg by mouth daily.     Historical Provider, MD  rosuvastatin (CRESTOR) 20 MG tablet Take 20 mg by mouth daily.    Historical Provider, MD  sertraline (ZOLOFT) 50 MG tablet Take 150 mg by mouth at bedtime.    Historical Provider, MD  sucralfate (CARAFATE) 1 G tablet Take 1 g by mouth 2 (two) times daily before a meal.    Historical Provider, MD  Suvorexant 5 MG TABS Take 1 tablet by mouth at bedtime. [place] 5MG     Historical Provider, MD  Vitamin D,  Ergocalciferol, (DRISDOL) 50000 UNITS CAPS capsule Take 50,000 Units by mouth every 30 (thirty) days. *Administered on the 12th of each month    Historical Provider, MD   Triage Vitals:BP 96/64 mmHg  Pulse 90  Temp(Src) 102.2 F (39 C) (Rectal)  Resp 24  Ht 5\' 8"  (1.727 m)  Wt 180 lb (81.647 kg)  BMI 27.38 kg/m2  SpO2 73%   Physical Exam  Constitutional: He is oriented to person, place, and time. He appears well-developed and well-nourished.  HENT:  Head: Normocephalic and atraumatic.  Right Ear: Hearing normal.  Left Ear: Hearing normal.  Nose: Nose normal.  Mouth/Throat: Oropharynx is clear and moist and mucous membranes are normal.  Eyes: Conjunctivae and EOM are normal. Pupils are equal, round, and reactive to light.  Neck: Normal range of motion. Neck supple.  Cardiovascular: Regular rhythm, S1 normal and S2 normal.  Tachycardia present.  Exam reveals no gallop and no friction rub.   No murmur heard. Pulmonary/Chest: Effort normal. Tachypnea noted. No respiratory distress. He has rhonchi. He has rales. He exhibits no tenderness.  Abdominal: Soft. Normal appearance and bowel sounds are normal. There is no hepatosplenomegaly. There is no tenderness. There is no rebound, no guarding, no tenderness at McBurney's point and negative Murphy's sign. No hernia.  Musculoskeletal: Normal range of motion.  Neurological: He is alert and oriented to person, place, and time. He has normal strength. No cranial nerve deficit or sensory deficit. Coordination normal. GCS eye subscore is 4. GCS verbal subscore is 5. GCS motor subscore is 6.  Pt is disoriented and does not answer questions appropriately.   Skin: Skin is warm, dry and intact. No rash noted. No cyanosis.  Psychiatric: He has a normal mood and affect. His speech is normal and behavior is normal. Thought content normal.  Nursing note and vitals reviewed.   ED Course  Procedures (including critical care time)  DIAGNOSTIC  STUDIES: Oxygen Saturation is 73% on RA, low by my interpretation.    COORDINATION OF CARE: 6:07 PM- Pt is being monitored  Results for orders placed or performed during the hospital encounter of 03/29/2014  Blood gas, arterial ([facility] ONLY)  Result Value Ref Range   FIO2 100.00 %   Delivery systems OXYGEN MASK    pH, Arterial 7.420 7.350 - 7.450   pCO2 arterial 39.6 35.0 - 45.0 mmHg   pO2, Arterial 40.9 (L) 80.0 - 100.0 mmHg   Bicarbonate 25.2 (H) 20.0 - 24.0 mEq/L   TCO2 22.1 0 - 100 mmol/L   Acid-Base Excess 1.2 0.0 - 2.0 mmol/L   O2 Saturation 70.7 %   Patient temperature 37.0    Collection site LEFT RADIAL    Drawn by COLLECTED BY RT    Sample type ARTERIAL    Allens test (pass/fail) PASS PASS  I-Stat CG4 Lactic Acid, ED  Result Value Ref Range   Lactic Acid, Venous 4.35 (H) 0.5 - 2.2 mmol/L          MDM   Final diagnoses:  None  HCAP  Presents to the ER for evaluation of difficulty breathing. Patient presents from nursing home. He was noted to spike a fever today and has had respiratory distress. Upon arrival to the ER, patient is in significant distress. He was on a nonrebreather facemask and satting 70%. He had diffuse wheezing, rales, rhonchi on examination. Patient significantly improved on BiPAP. Blood gas did not reveal any CO2 retention, simply hypoxia. Chest x-ray reveals multifocal process, likely healthcare associated pneumonia. Patient was identified as likely sepsis at arrival. He was placed in the sepsis pathway including empiric antibiotic coverage for likely pneumonia.   CRITICAL CARE Performed by: Orpah Greek   Total critical care time: 60min  Critical care time was exclusive of separately billable procedures and treating other patients.  Critical care was necessary to treat or prevent imminent or life-threatening deterioration.  Critical care was time spent personally by me on the following activities: development of treatment  plan with patient and/or surrogate as well as nursing,  discussions with consultants, evaluation of patient's response to treatment, examination of patient, obtaining history from patient or surrogate, ordering and performing treatments and interventions, ordering and review of laboratory studies, ordering and review of radiographic studies, pulse oximetry and re-evaluation of patient's condition.  I personally performed the services described in this documentation, which was scribed in my presence. The recorded information has been reviewed and is accurate.    Orpah Greek, MD 04/02/2014 2130

## 2014-03-27 NOTE — ED Notes (Addendum)
Pt sent from [facility] by EMS, O2 sats 79% on RA, temp 103.0 per facility, pt given 1g tylenol, 1g invanz IV, and 241ml NS bolus. Pt 72% on non-rebreather 15L O2.

## 2014-03-28 DIAGNOSIS — A419 Sepsis, unspecified organism: Secondary | ICD-10-CM

## 2014-03-28 LAB — BLOOD GAS, ARTERIAL
Acid-Base Excess: 0.5 mmol/L (ref 0.0–2.0)
Acid-Base Excess: 1.3 mmol/L (ref 0.0–2.0)
BICARBONATE: 25.6 meq/L — AB (ref 20.0–24.0)
BICARBONATE: 26.3 meq/L — AB (ref 20.0–24.0)
Delivery systems: POSITIVE
Drawn by: 38235
Drawn by: 22179
Expiratory PAP: 9
FIO2: 1 %
FIO2: 100 %
Inspiratory PAP: 19
O2 SAT: 98.2 %
O2 Saturation: 91 %
PATIENT TEMPERATURE: 37
PATIENT TEMPERATURE: 37
PO2 ART: 66.8 mmHg — AB (ref 80.0–100.0)
TCO2: 23.4 mmol/L (ref 0–100)
TCO2: 24 mmol/L (ref 0–100)
pCO2 arterial: 48.4 mmHg — ABNORMAL HIGH (ref 35.0–45.0)
pCO2 arterial: 49 mmHg — ABNORMAL HIGH (ref 35.0–45.0)
pH, Arterial: 7.343 — ABNORMAL LOW (ref 7.350–7.450)
pH, Arterial: 7.35 (ref 7.350–7.450)
pO2, Arterial: 140 mmHg — ABNORMAL HIGH (ref 80.0–100.0)

## 2014-03-28 LAB — COMPREHENSIVE METABOLIC PANEL
ALK PHOS: 35 U/L — AB (ref 39–117)
AST: 33 U/L (ref 0–37)
Albumin: 2 g/dL — ABNORMAL LOW (ref 3.5–5.2)
Anion gap: 13 (ref 5–15)
BUN: 31 mg/dL — AB (ref 6–23)
CO2: 24 mEq/L (ref 19–32)
Calcium: 8.1 mg/dL — ABNORMAL LOW (ref 8.4–10.5)
Chloride: 105 mEq/L (ref 96–112)
Creatinine, Ser: 0.82 mg/dL (ref 0.50–1.35)
GFR calc non Af Amer: 89 mL/min — ABNORMAL LOW (ref >90)
GLUCOSE: 171 mg/dL — AB (ref 70–99)
Potassium: 4.6 mEq/L (ref 3.7–5.3)
SODIUM: 142 meq/L (ref 137–147)
Total Bilirubin: 0.4 mg/dL (ref 0.3–1.2)
Total Protein: 5.8 g/dL — ABNORMAL LOW (ref 6.0–8.3)

## 2014-03-28 LAB — CBC
HCT: 37.8 % — ABNORMAL LOW (ref 39.0–52.0)
Hemoglobin: 12.2 g/dL — ABNORMAL LOW (ref 13.0–17.0)
MCH: 30.3 pg (ref 26.0–34.0)
MCHC: 32.3 g/dL (ref 30.0–36.0)
MCV: 93.8 fL (ref 78.0–100.0)
PLATELETS: 150 10*3/uL (ref 150–400)
RBC: 4.03 MIL/uL — ABNORMAL LOW (ref 4.22–5.81)
RDW: 16.7 % — ABNORMAL HIGH (ref 11.5–15.5)
WBC: 33.8 10*3/uL — ABNORMAL HIGH (ref 4.0–10.5)

## 2014-03-28 LAB — URINE CULTURE
COLONY COUNT: NO GROWTH
CULTURE: NO GROWTH

## 2014-03-28 LAB — GLUCOSE, CAPILLARY
GLUCOSE-CAPILLARY: 116 mg/dL — AB (ref 70–99)
GLUCOSE-CAPILLARY: 103 mg/dL — AB (ref 70–99)
Glucose-Capillary: 158 mg/dL — ABNORMAL HIGH (ref 70–99)
Glucose-Capillary: 103 mg/dL — ABNORMAL HIGH (ref 70–99)

## 2014-03-28 LAB — EXPECTORATED SPUTUM ASSESSMENT W REFEX TO RESP CULTURE

## 2014-03-28 LAB — HIV ANTIBODY (ROUTINE TESTING W REFLEX): HIV: NONREACTIVE

## 2014-03-28 LAB — LACTIC ACID, PLASMA: Lactic Acid, Venous: 1.5 mmol/L (ref 0.5–2.2)

## 2014-03-28 LAB — STREP PNEUMONIAE URINARY ANTIGEN: Strep Pneumo Urinary Antigen: NEGATIVE

## 2014-03-28 LAB — EXPECTORATED SPUTUM ASSESSMENT W GRAM STAIN, RFLX TO RESP C

## 2014-03-28 MED ORDER — DEXTROSE 5 % IV SOLN
INTRAVENOUS | Status: AC
  Filled 2014-03-28: qty 1

## 2014-03-28 MED ORDER — CETYLPYRIDINIUM CHLORIDE 0.05 % MT LIQD
7.0000 mL | Freq: Two times a day (BID) | OROMUCOSAL | Status: DC
  Administered 2014-03-28 – 2014-04-03 (×12): 7 mL via OROMUCOSAL

## 2014-03-28 MED ORDER — CETYLPYRIDINIUM CHLORIDE 0.05 % MT LIQD
7.0000 mL | Freq: Two times a day (BID) | OROMUCOSAL | Status: DC
Start: 2014-03-28 — End: 2014-03-29
  Administered 2014-03-28 – 2014-03-29 (×4): 7 mL via OROMUCOSAL

## 2014-03-28 MED ORDER — INSULIN ASPART 100 UNIT/ML ~~LOC~~ SOLN
0.0000 [IU] | SUBCUTANEOUS | Status: DC
  Administered 2014-03-28: 2 [IU] via SUBCUTANEOUS
  Administered 2014-03-29: 1 [IU] via SUBCUTANEOUS
  Administered 2014-03-29: 2 [IU] via SUBCUTANEOUS
  Administered 2014-03-29 (×2): 1 [IU] via SUBCUTANEOUS
  Administered 2014-03-30 (×2): 2 [IU] via SUBCUTANEOUS
  Administered 2014-03-30 (×2): 1 [IU] via SUBCUTANEOUS
  Administered 2014-03-30 – 2014-03-31 (×7): 2 [IU] via SUBCUTANEOUS
  Administered 2014-04-01: 1 [IU] via SUBCUTANEOUS
  Administered 2014-04-01 (×3): 2 [IU] via SUBCUTANEOUS
  Administered 2014-04-01 (×2): 3 [IU] via SUBCUTANEOUS
  Administered 2014-04-02 (×4): 2 [IU] via SUBCUTANEOUS
  Administered 2014-04-02: 3 [IU] via SUBCUTANEOUS
  Administered 2014-04-02: 2 [IU] via SUBCUTANEOUS
  Administered 2014-04-03 (×2): 3 [IU] via SUBCUTANEOUS
  Administered 2014-04-03: 2 [IU] via SUBCUTANEOUS
  Administered 2014-04-03: 3 [IU] via SUBCUTANEOUS
  Administered 2014-04-03 – 2014-04-04 (×4): 2 [IU] via SUBCUTANEOUS

## 2014-03-28 MED ORDER — PANTOPRAZOLE SODIUM 40 MG IV SOLR
40.0000 mg | Freq: Two times a day (BID) | INTRAVENOUS | Status: DC
  Administered 2014-03-28 – 2014-04-04 (×16): 40 mg via INTRAVENOUS
  Filled 2014-03-28 (×17): qty 40

## 2014-03-28 MED ORDER — CHLORHEXIDINE GLUCONATE 0.12 % MT SOLN
15.0000 mL | Freq: Two times a day (BID) | OROMUCOSAL | Status: DC
  Administered 2014-03-28 – 2014-04-04 (×15): 15 mL via OROMUCOSAL
  Filled 2014-03-28 (×17): qty 15

## 2014-03-28 MED ORDER — VALPROATE SODIUM 500 MG/5ML IV SOLN
500.0000 mg | Freq: Three times a day (TID) | INTRAVENOUS | Status: DC
  Administered 2014-03-28 – 2014-04-04 (×22): 500 mg via INTRAVENOUS
  Filled 2014-03-28 (×33): qty 5

## 2014-03-28 MED ORDER — METOPROLOL TARTRATE 1 MG/ML IV SOLN
5.0000 mg | Freq: Four times a day (QID) | INTRAVENOUS | Status: DC
  Administered 2014-03-28 – 2014-04-04 (×24): 5 mg via INTRAVENOUS
  Filled 2014-03-28 (×31): qty 5

## 2014-03-28 MED ORDER — ENOXAPARIN SODIUM 40 MG/0.4ML ~~LOC~~ SOLN
40.0000 mg | Freq: Every day | SUBCUTANEOUS | Status: DC
  Administered 2014-03-28 – 2014-04-04 (×8): 40 mg via SUBCUTANEOUS
  Filled 2014-03-28 (×9): qty 0.4

## 2014-03-28 MED ORDER — VANCOMYCIN HCL IN DEXTROSE 1-5 GM/200ML-% IV SOLN
INTRAVENOUS | Status: AC
  Filled 2014-03-28: qty 200

## 2014-03-28 MED ORDER — LEVOTHYROXINE SODIUM 100 MCG IV SOLR
75.0000 ug | Freq: Every day | INTRAVENOUS | Status: DC
  Administered 2014-03-29 – 2014-04-04 (×7): 75 ug via INTRAVENOUS
  Filled 2014-03-28 (×10): qty 5

## 2014-03-28 MED ORDER — FUROSEMIDE 10 MG/ML IJ SOLN
20.0000 mg | Freq: Two times a day (BID) | INTRAMUSCULAR | Status: AC
  Administered 2014-03-28 (×2): 20 mg via INTRAVENOUS
  Filled 2014-03-28: qty 2

## 2014-03-28 NOTE — Consult Note (Signed)
Consult requested by: Dr. Sarajane Jews Consult requested for respiratory failure:  HPI: This is a 68 year old who had been in the hospital earlier this month with respiratory failure thought to be related to congestive heart failure. He lives at a nursing home and had developed fairly acute increase in shortness of breath. He improved and was discharged back to the nursing home but yesterday was found to have altered mental status and shortness of breath . His temperature was 102.2. He was placed on BiPAP and remains on BiPAP. He will open his eyes but doesn't really give any meaningful response. He apparently has persistent residual expressive aphasia after a stroke. Chest x-ray shows diffuse bilateral infiltrates.  Past Medical History  Diagnosis Date  . PVD (peripheral vascular disease)     Severe lower extremitiy  . Type 2 diabetes mellitus   . Mixed hyperlipidemia   . Obesity   . Seizure disorder   . OSA (obstructive sleep apnea)   . CVA (cerebrovascular accident) 2004    Persistent residual expressive aphasia  . Coronary atherosclerosis of native coronary artery     Multivessel  . Chest discomfort     Chronic on narcotics - Dr. Woody Seller  . Cervical spine disease   . Iron deficiency anemia     Managed by Dr. Marin Olp  . PAF (paroxysmal atrial fibrillation)   . Carotid artery disease   . GERD (gastroesophageal reflux disease)   . Myocardial infarction   . Colon cancer 2007     Family History  Problem Relation Age of Onset  . Diabetes Mother      History   Social History  . Marital Status: Widowed    Spouse Name: N/A    Number of Children: N/A  . Years of Education: N/A   Social History Main Topics  . Smoking status: Former Smoker -- 1.00 packs/day for 25 years    Types: Cigarettes    Quit date: 10/14/1990  . Smokeless tobacco: Never Used  . Alcohol Use: No  . Drug Use: No  . Sexual Activity: No   Other Topics Concern  . None   Social History Narrative     ROS:  Unobtainable    Objective: Vital signs in last 24 hours: Temp:  [97.6 F (36.4 C)-102.2 F (39 C)] 97.6 F (36.4 C) (12/10 0700) Pulse Rate:  [66-101] 66 (12/10 0600) Resp:  [14-43] 17 (12/10 0600) BP: (91-135)/(50-75) 115/55 mmHg (12/10 0600) SpO2:  [70 %-100 %] 97 % (12/10 0600) FiO2 (%):  [100 %] 100 % (12/10 0503) Weight:  [80.74 kg (178 lb)-81.647 kg (180 lb)] 80.8 kg (178 lb 2.1 oz) (12/09 2330) Weight change:  Last BM Date: 03/26/14  Intake/Output from previous day: 12/09 0701 - 12/10 0700 In: 905 [I.V.:455; IV Piggyback:450] Out: 125 [Urine:125]  PHYSICAL EXAM He is a well-developed male who is on BiPAP. He opens his eyes in response to questions but does not have any other response. His mucous membranes are dry. His neck is supple without masses. Chest shows diffuse bilateral rhonchi and rales. His heart is regular now but apparently he has had bouts of atrial fibrillation in the past. I don't hear an S3 gallop but his heart sounds are somewhat distant. His abdomen is soft without masses. Central nervous system examination shows that he does arouse as mentioned.  Lab Results: Basic Metabolic Panel:  Recent Labs  04/10/2014 1935 03/28/14 0429  NA 140 142  K 4.8 4.6  CL 100 105  CO2 25 24  GLUCOSE 208* 171*  BUN 30* 31*  CREATININE 1.12 0.82  CALCIUM 8.5 8.1*   Liver Function Tests:  Recent Labs  04/04/2014 1935 03/28/14 0429  AST 21 33  ALT 5 <5  ALKPHOS 39 35*  BILITOT 0.5 0.4  PROT 6.2 5.8*  ALBUMIN 2.3* 2.0*   No results for input(s): LIPASE, AMYLASE in the last 72 hours. No results for input(s): AMMONIA in the last 72 hours. CBC:  Recent Labs  03/19/2014 1818 03/28/14 0429  WBC 28.9* 33.8*  NEUTROABS 24.1*  --   HGB 15.3 12.2*  HCT 47.0 37.8*  MCV 93.3 93.8  PLT 190 150   Cardiac Enzymes:  Recent Labs  04/15/2014 1935  TROPONINI <0.30   BNP:  Recent Labs  04/08/2014 1935  PROBNP 2710.0*   D-Dimer: No results for input(s): DDIMER  in the last 72 hours. CBG:  Recent Labs  03/28/14 0716  GLUCAP 158*   Hemoglobin A1C: No results for input(s): HGBA1C in the last 72 hours. Fasting Lipid Panel: No results for input(s): CHOL, HDL, LDLCALC, TRIG, CHOLHDL, LDLDIRECT in the last 72 hours. Thyroid Function Tests: No results for input(s): TSH, T4TOTAL, FREET4, T3FREE, THYROIDAB in the last 72 hours. Anemia Panel: No results for input(s): VITAMINB12, FOLATE, FERRITIN, TIBC, IRON, RETICCTPCT in the last 72 hours. Coagulation: No results for input(s): LABPROT, INR in the last 72 hours. Urine Drug Screen: Drugs of Abuse  No results found for: LABOPIA, COCAINSCRNUR, LABBENZ, AMPHETMU, THCU, LABBARB  Alcohol Level: No results for input(s): ETH in the last 72 hours. Urinalysis:  Recent Labs  03/26/2014 1849  COLORURINE YELLOW  LABSPEC 1.020  PHURINE 5.5  GLUCOSEU 100*  HGBUR NEGATIVE  BILIRUBINUR SMALL*  KETONESUR TRACE*  PROTEINUR NEGATIVE  UROBILINOGEN 2.0*  NITRITE NEGATIVE  LEUKOCYTESUR NEGATIVE   Misc. Labs:   ABGS:  Recent Labs  03/28/14 0517  PHART 7.343*  PO2ART 140.0*  TCO2 23.4  HCO3 25.6*     MICROBIOLOGY: Recent Results (from the past 240 hour(s))  MRSA PCR Screening     Status: Abnormal   Collection Time: 03/19/14  2:53 AM  Result Value Ref Range Status   MRSA by PCR POSITIVE (A) NEGATIVE Final    Comment: CRITICAL RESULT CALLED TO, READ BACK BY AND VERIFIED WITH: SHONEWITZ,L AT 9:50AM ON 03/19/14 BY FESTERMAN,C        The GeneXpert MRSA Assay (FDA approved for NASAL specimens only), is one component of a comprehensive MRSA colonization surveillance program. It is not intended to diagnose MRSA infection nor to guide or monitor treatment for MRSA infections.     Studies/Results: Dg Chest Port 1 View  04/09/2014   CLINICAL DATA:  Initial encounter for Respiratory distress beginning today.  EXAM: PORTABLE CHEST - 1 VIEW  COMPARISON:  03/18/2014.  FINDINGS: 1830 hrs. The cardio  pericardial silhouette is enlarged. The bilateral diffuse airspace disease seen previously persists without substantial interval change. Lung bases are slightly better aerated than they were on the previous study. Small bilateral pleural effusions are associated. Telemetry leads overlie the chest.  IMPRESSION: Cardiomegaly with extensive bilateral airspace disease, not substantially changed in the interval.   Electronically Signed   By: Misty Stanley M.D.   On: 03/23/2014 18:45    Medications:  Prior to Admission:  Prescriptions prior to admission  Medication Sig Dispense Refill Last Dose  . acetaminophen (TYLENOL) 325 MG tablet Take 650 mg by mouth every 4 (four) hours as needed for mild pain.   03/31/2014 at Unknown  time  . ALPRAZolam (XANAX) 0.5 MG tablet Take 0.5 mg by mouth 3 (three) times daily.   03/30/2014 at 1400  . amLODipine (NORVASC) 2.5 MG tablet Take 2.5 mg by mouth daily.   04/01/2014 at 900  . divalproex (DEPAKOTE) 500 MG DR tablet Take 500 mg by mouth 3 (three) times daily.    03/26/2014 at 1400  . folic acid (FOLVITE) 1 MG tablet Take 1 mg by mouth daily.   04/11/2014 at 900  . furosemide (LASIX) 40 MG tablet Take 1 tablet (40 mg total) by mouth daily.   04/04/2014 at 900  . ipratropium-albuterol (DUONEB) 0.5-2.5 (3) MG/3ML SOLN Take 3 mLs by nebulization every 6 (six) hours as needed (for shortness of breath/wheezing).   03/25/2014  . isosorbide dinitrate (ISORDIL) 20 MG tablet Take 60 mg by mouth 3 (three) times daily.   04/04/2014 at Unknown time  . levothyroxine (SYNTHROID, LEVOTHROID) 175 MCG tablet Take 175 mcg by mouth daily before breakfast.   04/04/2014 at Unknown time  . metoprolol tartrate (LOPRESSOR) 25 MG tablet Take 25 mg by mouth 2 (two) times daily.   04/01/2014 at [place]  . Olopatadine HCl (PATADAY) 0.2 % SOLN Apply 1 drop to eye every morning.   04/01/2014 at Unknown time  . pantoprazole (PROTONIX) 40 MG tablet Take 40 mg by mouth 2 (two) times daily.   04/10/2014 at Unknown  time  . Polyethyl Glycol-Propyl Glycol (SYSTANE) 0.4-0.3 % GEL Apply 1 drop to eye 3 (three) times daily.   04/13/2014 at Unknown time  . potassium chloride SA (K-DUR,KLOR-CON) 20 MEQ tablet Take 20 mEq by mouth daily.   04/08/2014 at Unknown time  . pregabalin (LYRICA) 100 MG capsule Take one capsule by mouth every eight hours for neuropathic pain R/T DM 90 capsule 5 03/26/2014 at 1400  . Rivaroxaban (XARELTO) 20 MG TABS tablet Take 20 mg by mouth daily with supper.    03/26/2014 at 1700  . rosuvastatin (CRESTOR) 20 MG tablet Take 20 mg by mouth daily.   03/26/2014 at Unknown time  . sertraline (ZOLOFT) 50 MG tablet Take 150 mg by mouth at bedtime.   03/26/2014 at Unknown time  . sucralfate (CARAFATE) 1 G tablet Take 1 g by mouth 2 (two) times daily before a meal.   04/03/2014 at Unknown time  . Vitamin D, Ergocalciferol, (DRISDOL) 50000 UNITS CAPS capsule Take 50,000 Units by mouth every 30 (thirty) days. *Administered on the 12th of each month   Past Week at Unknown time  . loperamide (IMODIUM) 2 MG capsule Take 2 mg by mouth as needed for diarrhea or loose stools.   unknown  . nitroGLYCERIN (NITROSTAT) 0.4 MG SL tablet Place 0.4 mg under the tongue every 5 (five) minutes as needed for chest pain.   unknown  . oxyCODONE (ROXICODONE) 15 MG immediate release tablet Take 7.5 mg by mouth every 6 (six) hours as needed for pain (*Only given as needed for pain greater than 5).   unknown  . Suvorexant 5 MG TABS Take 1 tablet by mouth at bedtime. [place] 5MG    unknown   Scheduled: . ALPRAZolam  0.5 mg Oral TID  . antiseptic oral rinse  7 mL Mouth Rinse BID  . ceFEPime (MAXIPIME) IV  1 g Intravenous Q8H  . divalproex  500 mg Oral TID  . furosemide  40 mg Oral Daily  . insulin aspart  0-9 Units Subcutaneous 6 times per day  . levothyroxine  175 mcg Oral QAC breakfast  .  metoprolol tartrate  25 mg Oral BID  . pantoprazole  40 mg Oral BID  . potassium chloride SA  20 mEq Oral Daily  . rivaroxaban  20 mg  Oral Q supper  . sodium chloride  3 mL Intravenous Q12H  . vancomycin  1,000 mg Intravenous Q12H   Continuous: . sodium chloride 75 mL/hr at 03/28/14 0800   QAS:[id number] **OR** acetaminophen, ondansetron **OR** ondansetron (ZOFRAN) IV, oxyCODONE  Assesment: He was admitted with acute respiratory failure with hypoxia. I think he has healthcare associated pneumonia based on the infiltrates on chest x-ray fever etc. He's had an elevated lactic acid level and has systemic inflammatory response syndrome as well. His blood gases adequate on BiPAP but he is requiring 100% oxygen. However I think that can be tapered somewhat. He is on a number of oral medications and I don't think it's going to be safe for him to attempt to swallow them at this time. Principal Problem:   Acute respiratory failure with hypoxia Active Problems:   Essential hypertension, benign   Hyperlipidemia   Obstructive sleep apnea   Paroxysmal atrial fibrillation   Encephalopathy   SIRS (systemic inflammatory response syndrome)   HCAP (healthcare-associated pneumonia)   Lactic acidosis   Well controlled type 2 diabetes mellitus   Seizures   Diskitis    Plan: Switch him to IV meds as possible. Continue with current antibiotic treatment. He will need speech evaluation when he is more alert. He is at high risk of needing intubation and mechanical ventilation.    LOS: 1 day   Doni Widmer L 03/28/2014, 8:11 AM

## 2014-03-28 NOTE — Progress Notes (Addendum)
PROGRESS NOTE  [name] ZLD:[id number] DOB: [birth year] DOA: 03/22/2014 PCP: Glenda Chroman., MD   Addendum 1800 Discussed with POA/sister Teri at bedside. Patient much improved, tolerating facemask. Plan to continue current antibiotics. Remains full code.  Summary: 67 year old man presented from [facility] with fever and profound hypoxia. Admitted for sepsis with hypotension, fever, acute respiratory failure on BiPAP secondary to pneumonia. Admitted earlier this month for diastolic heart failure exacerbation.  Assessment/Plan: 1. Sepsis secondary to presumed healthcare associated pneumonia with associated hypotension, fever and BiPAP dependent respiratory failure on admission. CT chest 02/28/2014 revealed extensive nodular airspace opacities throughout both lungs concerning for atypical infection or drug reaction. Chest x-ray 11/30 showed cardiomegaly with moderate pulmonary edema. Chest x-ray this admission 12/9 without significant change compared to his previous study. Clinically the patient has pneumonia rather than heart failure, is no evidence of volume overload on examination. 2. Acute hypoxic respiratory failure requiring BiPAP on admission. ABG this morning shows developing respiratory acidosis. Oxygenating well. Suspect chronic respiratory failure, hypercapnia based on previous ABG. 3. Diabetes mellitus type 2, appears stable. 4. Seizure disorder 5. Paroxysmal atrial fibrillation on Xarelto. 6. History of coronary artery disease 7. 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 History of diastolic congestive heart failure grade 1 9. Recently diagnosed gastric ulcer, continue PPI twice a day.   Patient very awake and alert now and follow simple commands. His ABG is concerning for developing respiratory acidosis, discussed with pulmonology, at this point plan repeat ABG this afternoon and monitor closely in case he needs intubation. Currently he is much more awake and  hopefully this can be avoided.  Plan to continue empiric antibiotics for presumed sepsis and healthcare associated pneumonia. Clinically I do not see evidence of decompensated heart failure.  Trend lactic acid  Consult pulmonology PPI twice daily  Speech evaluation. Change oral meds to IV  Discussed with brother Amori Colomb we discussed the possible need for mechanical ventilation. He is unsure of his brother's wishes. Patient sister Darlen Round is POA but at work. He will contact her and have her get in touch with me to further discuss. At this point patient is full code.  Code Status: full code DVT prophylaxis: Lovenox. Resume Xarelto when can take PO Family Communication:  Disposition Plan: return to [facility]?  Murray Hodgkins, MD  [facility]  Pager [phone] If 7PM-7AM, please contact night-coverage at [email or web address], password [facility] 03/28/2014, 8:00 AM  LOS: 1 day   Consultants:  Pulmonology  Procedures:    Antibiotics:  Cefepime 12/9 >>  Vancomycin 12/9 >>  HPI/Subjective: He has been evaluated by pulmonology.  Currently on BiPAP, awake alert but difficult to understand speech.  Objective: Filed Vitals:   03/28/14 0530 03/28/14 0545 03/28/14 0600 03/28/14 0700  BP: 107/56 107/51 115/55   Pulse: 66 67 66   Temp:    97.6 F (36.4 C)  TempSrc:    Core (Comment)  Resp: 19 18 17    Height:      Weight:      SpO2: 97% 97% 97%     Intake/Output Summary (Last 24 hours) at 03/28/14 0800 Last data filed at 03/28/14 0604  Gross per 24 hour  Intake    905 ml  Output    125 ml  Net    780 ml     Filed Weights   04/01/2014 1805 04/14/2014 1817 04/07/2014 2330  Weight: 81.647 kg (180 lb) 80.74 kg (178 lb) 80.8 kg (178 lb 2.1  oz)    Exam:     Afebrile this morning, hemodynamics stable.  General: Appears calm, comfortable. Opens eyes to voice.  Psych: Alert. Follows commands. Moves all extremities. Tries to speak but difficult to understand because  of BiPAP.  Eyes: Pupils equal, round, react to light.  ENT: Limited but appears grossly unremarkable  CV: Regular rate and rhythm. No murmur, rub or gallop. No lower extremity edema.  Respiratory: Decreased breath sounds bilaterally but clear. No frank wheezes, rales or rhonchi. Mild increased respiratory.  Abdomen: Soft, nontender, nondistended.  Skin: Appears grossly unremarkable.  Musculoskeletal: Excellent tone and strength all extremities.  Neuro: Grossly unremarkable  Data Reviewed:  ABG 7.34/48/140 this morning  BNP 2710  Lactic acid 4.35 on admission  WBC 28.9 >> 33.8. Influenza screen negative.  Blood cultures and urine culture pending.  Scheduled Meds: . ALPRAZolam  0.5 mg Oral TID  . antiseptic oral rinse  7 mL Mouth Rinse BID  . ceFEPime (MAXIPIME) IV  1 g Intravenous Q8H  . divalproex  500 mg Oral TID  . furosemide  40 mg Oral Daily  . insulin aspart  0-9 Units Subcutaneous 6 times per day  . levothyroxine  175 mcg Oral QAC breakfast  . metoprolol tartrate  25 mg Oral BID  . pantoprazole  40 mg Oral BID  . potassium chloride SA  20 mEq Oral Daily  . rivaroxaban  20 mg Oral Q supper  . sodium chloride  3 mL Intravenous Q12H  . vancomycin  1,000 mg Intravenous Q12H   Continuous Infusions: . sodium chloride 75 mL/hr at 03/28/14 0604    Principal Problem:   Acute respiratory failure with hypoxia Active Problems:   Essential hypertension, benign   Hyperlipidemia   Obstructive sleep apnea   Paroxysmal atrial fibrillation   Encephalopathy   SIRS (systemic inflammatory response syndrome)   HCAP (healthcare-associated pneumonia)   Lactic acidosis   Well controlled type 2 diabetes mellitus   Seizures   Diskitis   Time spent 40 minutes, greater than 50% in counseling and coordination of care

## 2014-03-28 NOTE — Care Management Note (Addendum)
    Page 1 of 1   03/29/2014     1:34:31 PM CARE MANAGEMENT NOTE 03/29/2014  Patient:  [name], [name]   Account Number:  [id number]  Date Initiated:  03/28/2014  Documentation initiated by:  Jolene Provost  Subjective/Objective Assessment:   Pt admitted for HCAP. Pt is from [place], [place].     Action/Plan:   Pt plans to return to SNF. No CM needs identified at this time. CSW aware of discharge plan and will arrange for return to facility. Will continue to follow for CM needs.   Anticipated DC Date:  03/31/2014   Anticipated DC Plan:  SKILLED NURSING FACILITY         Choice offered to / List presented to:             Status of service:  Completed, signed off Medicare Important Message given?  YES (If response is "NO", the following Medicare IM given date fields will be blank) Date Medicare IM given:  03/29/2014 Medicare IM given by:  Vladimir Creeks Date Additional Medicare IM given:   Additional Medicare IM given by:    Discharge Disposition:  [place]  Per UR Regulation:    If discussed at Long Length of Stay Meetings, dates discussed:    Comments:  03/29/2014 [place], RN, MSN, PCCN No CM needs identified. 03/28/2014 [place], RN, MSN, [employer]

## 2014-03-28 NOTE — Progress Notes (Signed)
POA -SISTER TERRY PRUSIA  ATTEMPTING TO CONTACT DR [place] ABOUT PT'S CODE STATUS. DR GOODRICH INFORMED AND GIVEN HER WORK #[id number] TO CALL AROUND [phone].

## 2014-03-28 NOTE — Care Management Utilization Note (Signed)
UR complete 

## 2014-03-28 NOTE — Plan of Care (Signed)
Problem: ICU Phase Progression Outcomes Goal: Voiding-avoid urinary catheter unless indicated Outcome: Not Progressing Temp foley placed in ed on 04/03/2014

## 2014-03-28 NOTE — Progress Notes (Signed)
SLP Cancellation Note  Patient Details Name: [name] MRN: [id number] DOB: 27-Jun-1945   Cancelled treatment:       Reason Eval/Treat Not Completed: Medical issues which prohibited therapy; Pt currently on bipap and RN advised Pt is not ready today. SLP knows this pt from previous admission last week. Pt had MBSS completed a couple of weeks ago at [facility] and oropharyngeal swallow was essentially [facility], however retention of bolus noted in esophagus. Pt underwent EGD last week with Dr. Laural Golden and reported relief in symptoms of epigastric pain following procedure. Gastritis identified during his examination; no strictures in esophagus. Pt may have element of esophageal dysmotility which would not be evident during EGD. During previous admission, I spoke with pt's sister and she felt that his pneumonia had never really cleared.   Plan: SLP will follow tomorrow for BSE in the afternoon if pt appropriate. D/W Jenny Reichmann, RN  Thank you,  Genene Churn, [place]    [place] 03/28/2014, 2:51 PM

## 2014-03-28 NOTE — Plan of Care (Signed)
Problem: ICU Phase Progression Outcomes Goal: O2 sats trending toward baseline Outcome: Progressing PT HAS GONE FROM BIPAP TO O2 VIA NRB Goal: Hemodynamically stable Outcome: Progressing AFEBRILE. HR AND BP STABLE Goal: Flu/PneumoVaccines if indicated Outcome: Completed/Met Date Met:  03/28/14 PT UP TO DATE ON VACCINES Goal: Initial discharge plan identified Outcome: Progressing PLAN FOR PT TO GO BACK TO [place] WHEN DISCHRGED Goal: Voiding-avoid urinary catheter unless indicated Outcome: Not Progressing PT CONTINUES TO REQUIRE FOLEY CATH D/T  URINARY RETENSION Goal: Other ICU Phase Outcomes/Goals Outcome: Progressing SISTER, TERRY PRUSIA-MED POA, IS TALKING TO DR [place]

## 2014-03-29 ENCOUNTER — Inpatient Hospital Stay (HOSPITAL_COMMUNITY): Payer: Medicare Other

## 2014-03-29 DIAGNOSIS — J9601 Acute respiratory failure with hypoxia: Secondary | ICD-10-CM

## 2014-03-29 LAB — POCT I-STAT 3, ART BLOOD GAS (G3+)
Bicarbonate: 23.4 mEq/L (ref 20.0–24.0)
O2 SAT: 93 %
PO2 ART: 62 mmHg — AB (ref 80.0–100.0)
TCO2: 25 mmol/L (ref 0–100)
pCO2 arterial: 34.8 mmHg — ABNORMAL LOW (ref 35.0–45.0)
pH, Arterial: 7.435 (ref 7.350–7.450)

## 2014-03-29 LAB — BLOOD GAS, ARTERIAL
ACID-BASE EXCESS: 1.8 mmol/L (ref 0.0–2.0)
Bicarbonate: 25.6 mEq/L — ABNORMAL HIGH (ref 20.0–24.0)
DELIVERY SYSTEMS: POSITIVE
DRAWN BY: 38235
Expiratory PAP: 5
FIO2: 0.7 %
INSPIRATORY PAP: 15
O2 SAT: 88.9 %
Patient temperature: 37
TCO2: 22.9 mmol/L (ref 0–100)
pCO2 arterial: 38.6 mmHg (ref 35.0–45.0)
pH, Arterial: 7.437 (ref 7.350–7.450)
pO2, Arterial: 56.7 mmHg — ABNORMAL LOW (ref 80.0–100.0)

## 2014-03-29 LAB — GLUCOSE, CAPILLARY
GLUCOSE-CAPILLARY: 75 mg/dL (ref 70–99)
GLUCOSE-CAPILLARY: 91 mg/dL (ref 70–99)
Glucose-Capillary: 105 mg/dL — ABNORMAL HIGH (ref 70–99)
Glucose-Capillary: 126 mg/dL — ABNORMAL HIGH (ref 70–99)
Glucose-Capillary: 132 mg/dL — ABNORMAL HIGH (ref 70–99)
Glucose-Capillary: 137 mg/dL — ABNORMAL HIGH (ref 70–99)

## 2014-03-29 LAB — BASIC METABOLIC PANEL
Anion gap: 10 (ref 5–15)
BUN: 25 mg/dL — AB (ref 6–23)
CALCIUM: 8.3 mg/dL — AB (ref 8.4–10.5)
CO2: 26 mEq/L (ref 19–32)
CREATININE: 0.64 mg/dL (ref 0.50–1.35)
Chloride: 103 mEq/L (ref 96–112)
GFR calc non Af Amer: >90 mL/min (ref >90)
GLUCOSE: 82 mg/dL (ref 70–99)
POTASSIUM: 4.2 meq/L (ref 3.7–5.3)
Sodium: 139 mEq/L (ref 137–147)

## 2014-03-29 LAB — CBC WITH DIFFERENTIAL/PLATELET
BASOS ABS: 0 10*3/uL (ref 0.0–0.1)
BASOS PCT: 0 % (ref 0–1)
EOS ABS: 0.1 10*3/uL (ref 0.0–0.7)
EOS PCT: 1 % (ref 0–5)
HCT: 37.9 % — ABNORMAL LOW (ref 39.0–52.0)
Hemoglobin: 12.6 g/dL — ABNORMAL LOW (ref 13.0–17.0)
Lymphocytes Relative: 14 % (ref 12–46)
Lymphs Abs: 2.3 10*3/uL (ref 0.7–4.0)
MCH: 31 pg (ref 26.0–34.0)
MCHC: 33.2 g/dL (ref 30.0–36.0)
MCV: 93.3 fL (ref 78.0–100.0)
Monocytes Absolute: 1.5 10*3/uL — ABNORMAL HIGH (ref 0.1–1.0)
Monocytes Relative: 9 % (ref 3–12)
NEUTROS PCT: 76 % (ref 43–77)
Neutro Abs: 12.7 10*3/uL — ABNORMAL HIGH (ref 1.7–7.7)
Platelets: 143 10*3/uL — ABNORMAL LOW (ref 150–400)
RBC: 4.06 MIL/uL — AB (ref 4.22–5.81)
RDW: 16.6 % — AB (ref 11.5–15.5)
WBC: 16.7 10*3/uL — ABNORMAL HIGH (ref 4.0–10.5)

## 2014-03-29 LAB — LEGIONELLA ANTIGEN, URINE

## 2014-03-29 LAB — VANCOMYCIN, TROUGH: VANCOMYCIN TR: 12.1 ug/mL (ref 10.0–20.0)

## 2014-03-29 MED ORDER — IOHEXOL 300 MG/ML  SOLN
80.0000 mL | Freq: Once | INTRAMUSCULAR | Status: AC | PRN
  Administered 2014-03-29: 80 mL via INTRAVENOUS

## 2014-03-29 MED ORDER — VANCOMYCIN HCL IN DEXTROSE 1-5 GM/200ML-% IV SOLN
1000.0000 mg | Freq: Three times a day (TID) | INTRAVENOUS | Status: DC
  Administered 2014-03-29 – 2014-03-30 (×3): 1000 mg via INTRAVENOUS
  Filled 2014-03-29 (×9): qty 200

## 2014-03-29 MED ORDER — LEVOFLOXACIN IN D5W 750 MG/150ML IV SOLN
750.0000 mg | INTRAVENOUS | Status: DC
  Administered 2014-03-29 – 2014-04-03 (×6): 750 mg via INTRAVENOUS
  Filled 2014-03-29 (×10): qty 150

## 2014-03-29 MED ORDER — METHYLPREDNISOLONE SODIUM SUCC 125 MG IJ SOLR
125.0000 mg | Freq: Once | INTRAMUSCULAR | Status: AC
  Administered 2014-03-29: 125 mg via INTRAVENOUS
  Filled 2014-03-29: qty 2

## 2014-03-29 MED ORDER — FUROSEMIDE 10 MG/ML IJ SOLN
40.0000 mg | Freq: Four times a day (QID) | INTRAMUSCULAR | Status: DC
  Administered 2014-03-29 – 2014-03-30 (×3): 40 mg via INTRAVENOUS
  Filled 2014-03-29 (×7): qty 4

## 2014-03-29 NOTE — Progress Notes (Signed)
ANTIBIOTIC CONSULT NOTE - FOLLOW UP  Pharmacy Consult for Vancomycin and Cefepime  Indication: pneumonia and rule out sepsis  Allergies  Allergen Reactions  . Morphine And Related Other (See Comments)    Per MAR  . Sulfate     Patient Measurements: Height: 5\' 9"  (175.3 cm) Weight: 188 lb 7.9 oz (85.5 kg) IBW/kg (Calculated) : 70.7  Vital Signs: Temp: 99.5 F (37.5 C) (12/11 0400) Temp Source: Core (Comment) (12/11 0400) BP: 108/50 mmHg (12/11 1043) Pulse Rate: 66 (12/11 1043) Intake/Output from previous day: 12/10 0701 - 12/11 0700 In: 2733 [P.O.:540; I.V.:1728; IV Piggyback:465] Out: 1950 [Urine:1950] Intake/Output from this shift: Total I/O In: 0  Out: 300 [Urine:300]  Labs:  Recent Labs  03/20/2014 1818 03/31/2014 1935 03/28/14 0429 03/29/14 0432  WBC 28.9*  --  33.8* 16.7*  HGB 15.3  --  12.2* 12.6*  PLT 190  --  150 143*  CREATININE  --  1.12 0.82 0.64   Estimated Creatinine Clearance: 95.8 mL/min (by C-G formula based on Cr of 0.64).  Recent Labs  03/29/14 0432  [place] 12.1     Microbiology: Recent Results (from the past 720 hour(s))  MRSA PCR Screening     Status: Abnormal   Collection Time: 03/19/14  2:53 AM  Result Value Ref Range Status   MRSA by PCR POSITIVE (A) NEGATIVE Final    Comment: CRITICAL RESULT CALLED TO, READ BACK BY AND VERIFIED WITH: SHONEWITZ,L AT 9:50AM ON 03/19/14 BY FESTERMAN,C        The GeneXpert MRSA Assay (FDA approved for NASAL specimens only), is one component of a comprehensive MRSA colonization surveillance program. It is not intended to diagnose MRSA infection nor to guide or monitor treatment for MRSA infections.   Blood Culture (routine x 2)     Status: None (Preliminary result)   Collection Time: 03/28/2014  6:18 PM  Result Value Ref Range Status   Specimen Description BLOOD LEFT ARM DRAWN BY RN GM  Final   Special Requests BOTTLES DRAWN AEROBIC ONLY 4CC  Final   Culture NO GROWTH 1 DAY  Final   Report Status PENDING  Incomplete  Urine culture     Status: None   Collection Time: 03/31/2014  6:49 PM  Result Value Ref Range Status   Specimen Description URINE, CATHETERIZED  Final   Special Requests NONE  Final   Culture  Setup Time   Final    03/28/2014 00:38 Performed at [place] Performed at [facility]   Final   Culture NO GROWTH Performed at [facility]   Final   Report Status 03/28/2014 FINAL  Final  Blood Culture (routine x 2)     Status: None (Preliminary result)   Collection Time: 03/30/2014  6:50 PM  Result Value Ref Range Status   Specimen Description BLOOD LEFT ARM  Final   Special Requests BOTTLES DRAWN AEROBIC ONLY 4CC  Final   Culture NO GROWTH 1 DAY  Final   Report Status PENDING  Incomplete  Culture, sputum-assessment     Status: None   Collection Time: 03/28/14  8:20 PM  Result Value Ref Range Status   Specimen Description TRACHEAL SITE  Final   Special Requests NONE  Final   Sputum evaluation   Final    THIS SPECIMEN IS ACCEPTABLE. RESPIRATORY CULTURE REPORT TO FOLLOW.   Report Status 03/28/2014 FINAL  Final    Anti-infectives    Start  Dose/Rate Route Frequency Ordered Stop   03/28/14 0300  ceFEPIme (MAXIPIME) 1 g in dextrose 5 % 50 mL IVPB     1 g100 mL/hr over 30 Minutes Intravenous Every 8 hours 03/26/2014 1818     03/24/2014 2200  ceFEPIme (MAXIPIME) 1 g in dextrose 5 % 50 mL IVPB  Status:  Discontinued     1 g100 mL/hr over 30 Minutes Intravenous 3 times per day 04/04/2014 2151 04/14/2014 2356   04/13/2014 1900  vancomycin (VANCOCIN) IVPB 1000 mg/200 mL premix     1,000 mg200 mL/hr over 60 Minutes Intravenous Every 12 hours 03/20/2014 1816     04/13/2014 1815  ceFEPIme (MAXIPIME) 2 g in dextrose 5 % 50 mL IVPB     2 g100 mL/hr over 30 Minutes Intravenous  Once 04/14/2014 1808 04/18/2014 1946   03/30/2014 1815  vancomycin (VANCOCIN) IVPB 1000 mg/200 mL premix  Status:  Discontinued     1,000 mg200 mL/hr over  60 Minutes Intravenous  Once 03/26/2014 1808 04/13/2014 1816      Assessment: Sepsis secondary to presumed healthcare associated pneumonia.  Trough below goal.  Micro (-) to date.  Goal of Therapy:  Vancomycin trough level 15-20 mcg/ml  Plan:  Increase Vancomycin to 1000 mg IV every 8 hours Repeat trough in 2-3 days if therapy continues. Follow up culture results  Pricilla Larsson 03/29/2014,11:29 AM

## 2014-03-29 NOTE — Progress Notes (Signed)
   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  P ESR Autoimmune panel Resp virus panel Move to [place] Continue bipap Hold off steroids for now (s/p 1 dose earlier 03/29/2014) Might need surgical lung biopsy (if so, might have to consider for BRAVE study - diagnostic study in ILD using bronch at time of surgical lung bx)   Dr. Brand Males, M.D., [facility].C.P Pulmonary and Critical Care Medicine Staff Physician [facility] Pager: [phone], If no answer or between  15:00h - 7:00h: call [phone]  03/29/2014 4:50 PM

## 2014-03-29 NOTE — Progress Notes (Signed)
Report called to C.Sorcha,RN at [facility]. Patient transferred to 2M11 via [place]. Sister present at time of transfer agreeable.

## 2014-03-29 NOTE — Progress Notes (Signed)
Subjective: He is awake and alert. He has remained on BiPAP but has been able to be turned down to 70%. His blood gas on 70% and BiPAP looks much better.  Objective: Vital signs in last 24 hours: Temp:  [96.5 F (35.8 C)-99.5 F (37.5 C)] 99.5 F (37.5 C) (12/11 0400) Pulse Rate:  [57-72] 68 (12/11 0600) Resp:  [16-27] 22 (12/11 0600) BP: (95-130)/(40-88) 95/56 mmHg (12/11 0600) SpO2:  [88 %-99 %] 89 % (12/11 0600) FiO2 (%):  [70 %-80 %] 70 % (12/11 0508) Weight:  [85.5 kg (188 lb 7.9 oz)] 85.5 kg (188 lb 7.9 oz) (12/11 0500) Weight change: 3.852 kg (8 lb 7.9 oz) Last BM Date: 03/26/14  Intake/Output from previous day: 12/10 0701 - 12/11 0700 In: 2733 [P.O.:540; I.V.:1728; IV Piggyback:465] Out: 1950 [Urine:1950]  PHYSICAL EXAM General appearance: alert, cooperative and mild distress Resp: rales bilaterally and rhonchi bilaterally Cardio: regular rate and rhythm, S1, S2 normal, no murmur, click, rub or gallop GI: soft, non-tender; bowel sounds normal; no masses,  no organomegaly Extremities: extremities normal, atraumatic, no cyanosis or edema  Lab Results:  Results for orders placed or performed during the hospital encounter of 03/31/2014 (from the past 48 hour(s))  Blood Culture (routine x 2)     Status: None (Preliminary result)   Collection Time: 04/03/2014  6:18 PM  Result Value Ref Range   Specimen Description BLOOD LEFT ARM DRAWN BY RN GM    Special Requests BOTTLES DRAWN AEROBIC ONLY 4CC    Culture NO GROWTH 1 DAY    Report Status PENDING   CBC WITH DIFFERENTIAL     Status: Abnormal   Collection Time: 04/18/2014  6:18 PM  Result Value Ref Range   WBC 28.9 (H) 4.0 - 10.5 K/uL    Comment: WHITE COUNT CONFIRMED ON SMEAR   RBC 5.04 4.22 - 5.81 MIL/uL   Hemoglobin 15.3 13.0 - 17.0 g/dL   HCT 47.0 39.0 - 52.0 %   MCV 93.3 78.0 - 100.0 fL   MCH 30.4 26.0 - 34.0 pg   MCHC 32.6 30.0 - 36.0 g/dL   RDW 16.5 (H) 11.5 - 15.5 %   Platelets 190 150 - 400 K/uL    Comment:  SPECIMEN CHECKED FOR CLOTS PLATELET COUNT CONFIRMED BY SMEAR GIANT PLATELETS SEEN LARGE PLATELETS PRESENT    Neutrophils Relative % 84 (H) 43 - 77 %   Neutro Abs 24.1 (H) 1.7 - 7.7 K/uL   Lymphocytes Relative 8 (L) 12 - 46 %   Lymphs Abs 2.2 0.7 - 4.0 K/uL   Monocytes Relative 9 3 - 12 %   Monocytes Absolute 2.6 (H) 0.1 - 1.0 K/uL   Eosinophils Relative 0 0 - 5 %   Eosinophils Absolute 0.0 0.0 - 0.7 K/uL   Basophils Relative 0 0 - 1 %   Basophils Absolute 0.0 0.0 - 0.1 K/uL  I-Stat CG4 Lactic Acid, ED     Status: Abnormal   Collection Time: 04/18/2014  6:25 PM  Result Value Ref Range   Lactic Acid, Venous 4.35 (H) 0.5 - 2.2 mmol/L  Blood gas, arterial ([facility] & [facility] ONLY)     Status: Abnormal   Collection Time: 04/13/2014  6:25 PM  Result Value Ref Range   FIO2 100.00 %   Delivery systems OXYGEN MASK    pH, Arterial 7.420 7.350 - 7.450   pCO2 arterial 39.6 35.0 - 45.0 mmHg   pO2, Arterial 40.9 (L) 80.0 - 100.0 mmHg   Bicarbonate 25.2 (  H) 20.0 - 24.0 mEq/L   TCO2 22.1 0 - 100 mmol/L   Acid-Base Excess 1.2 0.0 - 2.0 mmol/L   O2 Saturation 70.7 %   Patient temperature 37.0    Collection site LEFT RADIAL    Drawn by COLLECTED BY RT    Sample type ARTERIAL    Allens test (pass/fail) PASS PASS  Urinalysis, Routine w reflex microscopic     Status: Abnormal   Collection Time: 04/17/2014  6:49 PM  Result Value Ref Range   Color, Urine YELLOW YELLOW   APPearance CLEAR CLEAR   Specific Gravity, Urine 1.020 1.005 - 1.030   pH 5.5 5.0 - 8.0   Glucose, UA 100 (A) NEGATIVE mg/dL   Hgb urine dipstick NEGATIVE NEGATIVE   Bilirubin Urine SMALL (A) NEGATIVE   Ketones, ur TRACE (A) NEGATIVE mg/dL   Protein, ur NEGATIVE NEGATIVE mg/dL   Urobilinogen, UA 2.0 (H) 0.0 - 1.0 mg/dL   Nitrite NEGATIVE NEGATIVE   Leukocytes, UA NEGATIVE NEGATIVE    Comment: MICROSCOPIC NOT DONE ON URINES WITH NEGATIVE PROTEIN, BLOOD, LEUKOCYTES, NITRITE, OR GLUCOSE <1000 mg/dL.  Urine culture     Status: None    Collection Time: 04/13/2014  6:49 PM  Result Value Ref Range   Specimen Description URINE, CATHETERIZED    Special Requests NONE    Culture  Setup Time      03/28/2014 00:38 Performed at [place] Performed at [facility]     Culture NO GROWTH Performed at [facility]     Report Status 03/28/2014 FINAL   Blood Culture (routine x 2)     Status: None (Preliminary result)   Collection Time: 03/24/2014  6:50 PM  Result Value Ref Range   Specimen Description BLOOD LEFT ARM    Special Requests BOTTLES DRAWN AEROBIC ONLY 4CC    Culture NO GROWTH 1 DAY    Report Status PENDING   Comprehensive metabolic panel     Status: Abnormal   Collection Time: 03/19/2014  7:35 PM  Result Value Ref Range   Sodium 140 137 - 147 mEq/L   Potassium 4.8 3.7 - 5.3 mEq/L   Chloride 100 96 - 112 mEq/L   CO2 25 19 - 32 mEq/L   Glucose, Bld 208 (H) 70 - 99 mg/dL   BUN 30 (H) 6 - 23 mg/dL   Creatinine, Ser 1.12 0.50 - 1.35 mg/dL   Calcium 8.5 8.4 - 10.5 mg/dL   Total Protein 6.2 6.0 - 8.3 g/dL   Albumin 2.3 (L) 3.5 - 5.2 g/dL   AST 21 0 - 37 U/L   ALT 5 0 - 53 U/L   Alkaline Phosphatase 39 39 - 117 U/L   Total Bilirubin 0.5 0.3 - 1.2 mg/dL   GFR calc non Af Amer 66 (L) >90 mL/min   GFR calc Af Amer 76 (L) >90 mL/min    Comment: (NOTE) The eGFR has been calculated using the CKD EPI equation. This calculation has not been validated in all clinical situations. eGFR's persistently <90 mL/min signify possible Chronic Kidney Disease.    Anion gap 15 5 - 15  Pro b natriuretic peptide (BNP)  - IF patient is dyspneic     Status: Abnormal   Collection Time: 03/24/2014  7:35 PM  Result Value Ref Range   Pro B Natriuretic peptide (BNP) 2710.0 (H) 0 - 125 pg/mL  Troponin I     Status: None   Collection  Time: 03/20/2014  7:35 PM  Result Value Ref Range   Troponin I <0.30 <0.30 ng/mL    Comment:        Due to the release kinetics of cTnI, a negative result  within the first hours of the onset of symptoms does not rule out myocardial infarction with certainty. If myocardial infarction is still suspected, repeat the test at appropriate intervals.   Influenza panel by pcr     Status: None   Collection Time: 03/30/2014  9:30 PM  Result Value Ref Range   Influenza A By PCR NEGATIVE NEGATIVE   Influenza B By PCR NEGATIVE NEGATIVE   H1N1 flu by pcr NOT DETECTED NOT DETECTED    Comment:        The Xpert Flu assay (FDA approved for nasal aspirates or washes and nasopharyngeal swab specimens), is intended as an aid in the diagnosis of influenza and should not be used as a sole basis for treatment.   HIV antibody     Status: None   Collection Time: 03/21/2014  9:56 PM  Result Value Ref Range   HIV 1&2 Ab, 4th Generation NONREACTIVE NONREACTIVE    Comment: (NOTE) A NONREACTIVE HIV Ag/Ab result does not exclude HIV infection since the time frame for seroconversion is variable. If acute HIV infection is suspected, a HIV-1 RNA Qualitative TMA test is recommended. HIV-1/2 Antibody Diff         Not indicated. HIV-1 RNA, Qual TMA           Not indicated. PLEASE NOTE: This information has been disclosed to you from records whose confidentiality may be protected by state law. If your state requires such protection, then the state law prohibits you from making any further disclosure of the information without the specific written consent of the person to whom it pertains, or as otherwise permitted by law. A general authorization for the release of medical or other information is NOT sufficient for this purpose. The performance of this assay has not been clinically validated in patients less than 35 years old. Performed at [facility] Lactic Acid, ED     Status: Abnormal   Collection Time: 04/15/2014 10:18 PM  Result Value Ref Range   Lactic Acid, Venous 4.29 (H) 0.5 - 2.2 mmol/L  Comprehensive metabolic panel     Status: Abnormal    Collection Time: 03/28/14  4:29 AM  Result Value Ref Range   Sodium 142 137 - 147 mEq/L   Potassium 4.6 3.7 - 5.3 mEq/L   Chloride 105 96 - 112 mEq/L   CO2 24 19 - 32 mEq/L   Glucose, Bld 171 (H) 70 - 99 mg/dL   BUN 31 (H) 6 - 23 mg/dL   Creatinine, Ser 0.82 0.50 - 1.35 mg/dL   Calcium 8.1 (L) 8.4 - 10.5 mg/dL   Total Protein 5.8 (L) 6.0 - 8.3 g/dL   Albumin 2.0 (L) 3.5 - 5.2 g/dL   AST 33 0 - 37 U/L   ALT <5 0 - 53 U/L   Alkaline Phosphatase 35 (L) 39 - 117 U/L   Total Bilirubin 0.4 0.3 - 1.2 mg/dL   GFR calc non Af Amer 89 (L) >90 mL/min   GFR calc Af Amer >90 >90 mL/min    Comment: (NOTE) The eGFR has been calculated using the CKD EPI equation. This calculation has not been validated in all clinical situations. eGFR's persistently <90 mL/min signify possible Chronic Kidney Disease.  Anion gap 13 5 - 15  CBC     Status: Abnormal   Collection Time: 03/28/14  4:29 AM  Result Value Ref Range   WBC 33.8 (H) 4.0 - 10.5 K/uL   RBC 4.03 (L) 4.22 - 5.81 MIL/uL   Hemoglobin 12.2 (L) 13.0 - 17.0 g/dL    Comment: DELTA CHECK NOTED RESULT REPEATED AND VERIFIED    HCT 37.8 (L) 39.0 - 52.0 %   MCV 93.8 78.0 - 100.0 fL   MCH 30.3 26.0 - 34.0 pg   MCHC 32.3 30.0 - 36.0 g/dL   RDW 16.7 (H) 11.5 - 15.5 %   Platelets 150 150 - 400 K/uL  Blood gas, arterial     Status: Abnormal   Collection Time: 03/28/14  5:17 AM  Result Value Ref Range   FIO2 1.00 %   Delivery systems BILEVEL POSITIVE AIRWAY PRESSURE    Inspiratory PAP 19    Expiratory PAP 9    pH, Arterial 7.343 (L) 7.350 - 7.450   pCO2 arterial 48.4 (H) 35.0 - 45.0 mmHg   pO2, Arterial 140.0 (H) 80.0 - 100.0 mmHg   Bicarbonate 25.6 (H) 20.0 - 24.0 mEq/L   TCO2 23.4 0 - 100 mmol/L   Acid-Base Excess 0.5 0.0 - 2.0 mmol/L   O2 Saturation 98.2 %   Patient temperature 37.0    Collection site BRACHIAL ARTERY    Drawn by [phone]    Sample type ARTERIAL DRAW    Allens test (pass/fail) PASS PASS  Glucose, capillary     Status:  Abnormal   Collection Time: 03/28/14  7:16 AM  Result Value Ref Range   Glucose-Capillary 158 (H) 70 - 99 mg/dL  Strep pneumoniae urinary antigen     Status: None   Collection Time: 03/28/14  9:00 AM  Result Value Ref Range   Strep Pneumo Urinary Antigen NEGATIVE NEGATIVE    Comment:        Infection due to S. pneumoniae cannot be absolutely ruled out since the antigen present may be below the detection limit of the test. Performed at [facility]   Lactic acid, plasma     Status: None   Collection Time: 03/28/14 10:52 AM  Result Value Ref Range   Lactic Acid, Venous 1.5 0.5 - 2.2 mmol/L  Glucose, capillary     Status: Abnormal   Collection Time: 03/28/14 11:32 AM  Result Value Ref Range   Glucose-Capillary 116 (H) 70 - 99 mg/dL   Comment 1 Documented in Chart    Comment 2 Notify RN   Blood gas, arterial     Status: Abnormal   Collection Time: 03/28/14  2:00 PM  Result Value Ref Range   FIO2 100.00 %   Delivery systems OXYGEN MASK    pH, Arterial 7.350 7.350 - 7.450   pCO2 arterial 49.0 (H) 35.0 - 45.0 mmHg   pO2, Arterial 66.8 (L) 80.0 - 100.0 mmHg   Bicarbonate 26.3 (H) 20.0 - 24.0 mEq/L   TCO2 24.0 0 - 100 mmol/L   Acid-Base Excess 1.3 0.0 - 2.0 mmol/L   O2 Saturation 91.0 %   Patient temperature 37.0    Collection site BRACHIAL ARTERY    Drawn by [id number]    Sample type ARTERIAL    Allens test (pass/fail) NOT INDICATED (A) PASS  Glucose, capillary     Status: Abnormal   Collection Time: 03/28/14  4:48 PM  Result Value Ref Range   Glucose-Capillary 103 (H) 70 - 99 mg/dL  Comment 1 Documented in Chart    Comment 2 Notify RN   Glucose, capillary     Status: Abnormal   Collection Time: 03/28/14  7:41 PM  Result Value Ref Range   Glucose-Capillary 103 (H) 70 - 99 mg/dL  Culture, sputum-assessment     Status: None   Collection Time: 03/28/14  8:20 PM  Result Value Ref Range   Specimen Description TRACHEAL SITE    Special Requests NONE    Sputum evaluation       THIS SPECIMEN IS ACCEPTABLE. RESPIRATORY CULTURE REPORT TO FOLLOW.   Report Status 03/28/2014 FINAL   Glucose, capillary     Status: Abnormal   Collection Time: 03/29/14 12:02 AM  Result Value Ref Range   Glucose-Capillary 126 (H) 70 - 99 mg/dL  Glucose, capillary     Status: None   Collection Time: 03/29/14  4:15 AM  Result Value Ref Range   Glucose-Capillary 75 70 - 99 mg/dL  Basic metabolic panel     Status: Abnormal   Collection Time: 03/29/14  4:32 AM  Result Value Ref Range   Sodium 139 137 - 147 mEq/L   Potassium 4.2 3.7 - 5.3 mEq/L   Chloride 103 96 - 112 mEq/L   CO2 26 19 - 32 mEq/L   Glucose, Bld 82 70 - 99 mg/dL   BUN 25 (H) 6 - 23 mg/dL   Creatinine, Ser 0.64 0.50 - 1.35 mg/dL   Calcium 8.3 (L) 8.4 - 10.5 mg/dL   GFR calc non Af Amer >90 >90 mL/min   GFR calc Af Amer >90 >90 mL/min    Comment: (NOTE) The eGFR has been calculated using the CKD EPI equation. This calculation has not been validated in all clinical situations. eGFR's persistently <90 mL/min signify possible Chronic Kidney Disease.    Anion gap 10 5 - 15  CBC with Differential     Status: Abnormal   Collection Time: 03/29/14  4:32 AM  Result Value Ref Range   WBC 16.7 (H) 4.0 - 10.5 K/uL   RBC 4.06 (L) 4.22 - 5.81 MIL/uL   Hemoglobin 12.6 (L) 13.0 - 17.0 g/dL   HCT 37.9 (L) 39.0 - 52.0 %   MCV 93.3 78.0 - 100.0 fL   MCH 31.0 26.0 - 34.0 pg   MCHC 33.2 30.0 - 36.0 g/dL   RDW 16.6 (H) 11.5 - 15.5 %   Platelets 143 (L) 150 - 400 K/uL   Neutrophils Relative % 76 43 - 77 %   Neutro Abs 12.7 (H) 1.7 - 7.7 K/uL   Lymphocytes Relative 14 12 - 46 %   Lymphs Abs 2.3 0.7 - 4.0 K/uL   Monocytes Relative 9 3 - 12 %   Monocytes Absolute 1.5 (H) 0.1 - 1.0 K/uL   Eosinophils Relative 1 0 - 5 %   Eosinophils Absolute 0.1 0.0 - 0.7 K/uL   Basophils Relative 0 0 - 1 %   Basophils Absolute 0.0 0.0 - 0.1 K/uL  Vancomycin, trough     Status: None   Collection Time: 03/29/14  4:32 AM  Result Value Ref Range    Vancomycin Tr 12.1 10.0 - 20.0 ug/mL  Blood gas, arterial     Status: Abnormal   Collection Time: 03/29/14  5:13 AM  Result Value Ref Range   FIO2 0.70 %   Delivery systems BILEVEL POSITIVE AIRWAY PRESSURE    Inspiratory PAP 15    Expiratory PAP 5    pH, Arterial 7.437 7.350 -  7.450   pCO2 arterial 38.6 35.0 - 45.0 mmHg   pO2, Arterial 56.7 (L) 80.0 - 100.0 mmHg   Bicarbonate 25.6 (H) 20.0 - 24.0 mEq/L   TCO2 22.9 0 - 100 mmol/L   Acid-Base Excess 1.8 0.0 - 2.0 mmol/L   O2 Saturation 88.9 %   Patient temperature 37.0    Collection site BRACHIAL ARTERY    Drawn by [phone]    Sample type ARTERIAL DRAW    Allens test (pass/fail) PASS PASS  Glucose, capillary     Status: None   Collection Time: 03/29/14  7:53 AM  Result Value Ref Range   Glucose-Capillary 91 70 - 99 mg/dL   Comment 1 Notify RN     ABGS  Recent Labs  03/29/14 0513  PHART 7.437  PO2ART 56.7*  TCO2 22.9  HCO3 25.6*   CULTURES Recent Results (from the past 240 hour(s))  Blood Culture (routine x 2)     Status: None (Preliminary result)   Collection Time: 03/26/2014  6:18 PM  Result Value Ref Range Status   Specimen Description BLOOD LEFT ARM DRAWN BY RN GM  Final   Special Requests BOTTLES DRAWN AEROBIC ONLY 4CC  Final   Culture NO GROWTH 1 DAY  Final   Report Status PENDING  Incomplete  Urine culture     Status: None   Collection Time: 04/15/2014  6:49 PM  Result Value Ref Range Status   Specimen Description URINE, CATHETERIZED  Final   Special Requests NONE  Final   Culture  Setup Time   Final    03/28/2014 00:38 Performed at [place] Performed at [facility]   Final   Culture NO GROWTH Performed at [facility]   Final   Report Status 03/28/2014 FINAL  Final  Blood Culture (routine x 2)     Status: None (Preliminary result)   Collection Time: 03/23/2014  6:50 PM  Result Value Ref Range Status   Specimen Description BLOOD LEFT ARM  Final    Special Requests BOTTLES DRAWN AEROBIC ONLY 4CC  Final   Culture NO GROWTH 1 DAY  Final   Report Status PENDING  Incomplete  Culture, sputum-assessment     Status: None   Collection Time: 03/28/14  8:20 PM  Result Value Ref Range Status   Specimen Description TRACHEAL SITE  Final   Special Requests NONE  Final   Sputum evaluation   Final    THIS SPECIMEN IS ACCEPTABLE. RESPIRATORY CULTURE REPORT TO FOLLOW.   Report Status 03/28/2014 FINAL  Final   Studies/Results: Dg Chest Port 1 View  04/06/2014   CLINICAL DATA:  Initial encounter for Respiratory distress beginning today.  EXAM: PORTABLE CHEST - 1 VIEW  COMPARISON:  03/18/2014.  FINDINGS: 1830 hrs. The cardio pericardial silhouette is enlarged. The bilateral diffuse airspace disease seen previously persists without substantial interval change. Lung bases are slightly better aerated than they were on the previous study. Small bilateral pleural effusions are associated. Telemetry leads overlie the chest.  IMPRESSION: Cardiomegaly with extensive bilateral airspace disease, not substantially changed in the interval.   Electronically Signed   By: Misty Stanley M.D.   On: 04/01/2014 18:45    Medications:  Prior to Admission:  Prescriptions prior to admission  Medication Sig Dispense Refill Last Dose  . acetaminophen (TYLENOL) 325 MG tablet Take 650 mg by mouth every 4 (four) hours as needed for mild pain.   04/03/2014 at Unknown  time  . ALPRAZolam (XANAX) 0.5 MG tablet Take 0.5 mg by mouth 3 (three) times daily.   04/11/2014 at 1400  . amLODipine (NORVASC) 2.5 MG tablet Take 2.5 mg by mouth daily.   03/28/2014 at 900  . divalproex (DEPAKOTE) 500 MG DR tablet Take 500 mg by mouth 3 (three) times daily.    04/04/2014 at 1400  . folic acid (FOLVITE) 1 MG tablet Take 1 mg by mouth daily.   03/23/2014 at 900  . furosemide (LASIX) 40 MG tablet Take 1 tablet (40 mg total) by mouth daily.   03/31/2014 at 900  . ipratropium-albuterol (DUONEB) 0.5-2.5 (3)  MG/3ML SOLN Take 3 mLs by nebulization every 6 (six) hours as needed (for shortness of breath/wheezing).   03/25/2014  . isosorbide dinitrate (ISORDIL) 20 MG tablet Take 60 mg by mouth 3 (three) times daily.   03/26/2014 at Unknown time  . levothyroxine (SYNTHROID, LEVOTHROID) 175 MCG tablet Take 175 mcg by mouth daily before breakfast.   04/12/2014 at Unknown time  . metoprolol tartrate (LOPRESSOR) 25 MG tablet Take 25 mg by mouth 2 (two) times daily.   04/08/2014 at [place]  . Olopatadine HCl (PATADAY) 0.2 % SOLN Apply 1 drop to eye every morning.   03/23/2014 at Unknown time  . pantoprazole (PROTONIX) 40 MG tablet Take 40 mg by mouth 2 (two) times daily.   03/29/2014 at Unknown time  . Polyethyl Glycol-Propyl Glycol (SYSTANE) 0.4-0.3 % GEL Apply 1 drop to eye 3 (three) times daily.   04/17/2014 at Unknown time  . potassium chloride SA (K-DUR,KLOR-CON) 20 MEQ tablet Take 20 mEq by mouth daily.   04/09/2014 at Unknown time  . pregabalin (LYRICA) 100 MG capsule Take one capsule by mouth every eight hours for neuropathic pain R/T DM 90 capsule 5 04/16/2014 at 1400  . Rivaroxaban (XARELTO) 20 MG TABS tablet Take 20 mg by mouth daily with supper.    03/26/2014 at 1700  . rosuvastatin (CRESTOR) 20 MG tablet Take 20 mg by mouth daily.   03/26/2014 at Unknown time  . sertraline (ZOLOFT) 50 MG tablet Take 150 mg by mouth at bedtime.   03/26/2014 at Unknown time  . sucralfate (CARAFATE) 1 G tablet Take 1 g by mouth 2 (two) times daily before a meal.   04/04/2014 at Unknown time  . Vitamin D, Ergocalciferol, (DRISDOL) 50000 UNITS CAPS capsule Take 50,000 Units by mouth every 30 (thirty) days. *Administered on the 12th of each month   Past Week at Unknown time  . loperamide (IMODIUM) 2 MG capsule Take 2 mg by mouth as needed for diarrhea or loose stools.   unknown  . nitroGLYCERIN (NITROSTAT) 0.4 MG SL tablet Place 0.4 mg under the tongue every 5 (five) minutes as needed for chest pain.   unknown  . oxyCODONE (ROXICODONE) 15  MG immediate release tablet Take 7.5 mg by mouth every 6 (six) hours as needed for pain (*Only given as needed for pain greater than 5).   unknown  . Suvorexant 5 MG TABS Take 1 tablet by mouth at bedtime. BELSOMRA 5MG   unknown   Scheduled: . antiseptic oral rinse  7 mL Mouth Rinse BID  . antiseptic oral rinse  7 mL Mouth Rinse q12n4p  . ceFEPime (MAXIPIME) IV  1 g Intravenous Q8H  . chlorhexidine  15 mL Mouth Rinse BID  . enoxaparin (LOVENOX) injection  40 mg Subcutaneous Daily  . insulin aspart  0-9 Units Subcutaneous 6 times per day  . levothyroxine  75 mcg Intravenous QAC breakfast  . metoprolol  5 mg Intravenous 4 times per day  . pantoprazole (PROTONIX) IV  40 mg Intravenous Q12H  . sodium chloride  3 mL Intravenous Q12H  . valproate sodium  500 mg Intravenous 3 times per day  . vancomycin  1,000 mg Intravenous Q12H   Continuous: . sodium chloride 75 mL/hr at 03/29/14 0500   WGN:[id number] **OR** acetaminophen, ondansetron **OR** ondansetron (ZOFRAN) IV  Assesment: He was admitted with healthcare associated pneumonia and sepsis. He probably has some element of CHF as well. He had lactic acidosis. He is improving. He has remained on BiPAP. I attempted to put him on a nonrebreather mask this morning but he failed with oxygen levels dropping into the low 80s. Principal Problem:   Sepsis Active Problems:   Essential hypertension, benign   Hyperlipidemia   Obstructive sleep apnea   Paroxysmal atrial fibrillation   Acute respiratory failure with hypoxia   Encephalopathy   HCAP (healthcare-associated pneumonia)   Lactic acidosis   Well controlled type 2 diabetes mellitus   Seizures   Diskitis    Plan: Continue current treatments. Chest x-ray is pending. Attempt again at getting him off BiPAP later.    LOS: 2 days   Asaiah Hunnicutt L 03/29/2014, 8:09 AM

## 2014-03-29 NOTE — Progress Notes (Signed)
CT revealed worsening diffuse patchy consolidation all lung fields, concerning for progressive pneumonitis/ARDS.  Patient unable to tolerate being off BiPAP today. Currently on FiO2 80%, PEEP 8. SpO2 low 90s, RR low 20s.  Appears calm and comfortable. Nontoxic. Speech clear although limited by BiPAP, can speak in full sentences. Lungs clear with good air movement. CV unremarkable. No LE edema. Perfusion appears grossly normal.  Although appears stable, he has regressed and is slowly worsening. No acute need for intubation but may need bronch or lung biopsy. Discussed gravity of condition with POA/sister--patient full code, would want full evaluation and care.  Discussed with PCCM Dr. Chase Caller. DDx broad, probably will need bronch or lung biopsy. Additional labs ordered. Hold on further steroids. Plan transfer to [facility] PCCM service for further evaluation.  Updated sister/POA at bedside and patient, agreeable to plan. Patient appears stable for transfer with risk outweighed by benefit.  Murray Hodgkins, MD [facility] [phone]

## 2014-03-29 NOTE — H&P (Signed)
PULMONARY / CRITICAL CARE MEDICINE HISTORY AND PHYSICAL EXAMINATION   Name: [name] MRN: [id number] DOB: [birth year]    ADMISSION DATE:  03/26/2014  PRIMARY SERVICE: PCCM  CHIEF COMPLAINT:  Dyspnea  BRIEF PATIENT DESCRIPTION: 68 y/o man with poor functional status and respiratory failure  SIGNIFICANT EVENTS / STUDIES:  Transferred from [facility], stable on BiPAP.  LINES / TUBES: PIV x3 - 04/04/2014 Deneise Lever [place]) Foley Catheter - 04/07/2014 Deneise Lever [place])  CULTURES: Blood - 04/18/2014 Urine - 04/09/2014  ANTIBIOTICS: Cefepime - 1g q8h Levofloxacin - 750mg  q24h Vancomycin - 1g q12h  HISTORY OF PRESENT ILLNESS:   [name] is a 68 y/o man with a complex PMHx and several months of progressive decline likely due to at least in part to underlying vascular disease including CAD, stroke, and PVD, who was transferred to [facility] after worsening hypoxia and chest imaging. The patient's sister reports that he has been living at a SNF for some time, and over the last month had a significant functional decline (no longer to do ADLs, etc) as well as significant weight loss. He did report some progressive abdominal pain and underwent EGD / CSY which reportedly showed some ulceration and gastritis. It is not known if biopsies were taken. He has been admitted twice with respiratory failure, at first this was felt to be due to heart failure exacerbation, and he did improve some with diuresis. He was then discharged 12/5 and re-presented 12/9 with dyspnea and hypoxia, with fevers as well. He was treated for HCAP with resolution of his fever and leukocytosis, but with persistent hypoxia. On the day of transfer, he had a chest CT that showed worsening scattered nodular groundglass opacities, with a dependent distribution. Of note, the patient has a brother with recently diagnosed ILD (per sister, is presumed IPF and was started on either pirfenidone and nintedanib).  PAST MEDICAL HISTORY :  Past Medical History   Diagnosis Date  . PVD (peripheral vascular disease)     Severe lower extremitiy  . Type 2 diabetes mellitus   . Mixed hyperlipidemia   . Obesity   . Seizure disorder   . OSA (obstructive sleep apnea)   . CVA (cerebrovascular accident) 2004    Persistent residual expressive aphasia  . Coronary atherosclerosis of native coronary artery     Multivessel  . Chest discomfort     Chronic on narcotics - Dr. Woody Seller  . Cervical spine disease   . Iron deficiency anemia     Managed by Dr. Marin Olp  . PAF (paroxysmal atrial fibrillation)   . Carotid artery disease   . GERD (gastroesophageal reflux disease)   . Myocardial infarction   . Colon cancer 2007   Past Surgical History  Procedure Laterality Date  . Carotid endarterectomy      Left  . Partial colectomy      2007  . Coronary artery bypass graft  05/2006    LIMA to diagonal, RIMA to LAD, radial to OM, radial to PDA  . Left shoulder surgery    . Cholecystectomy    . Femoral artery - popliteal artery bypass graft      Bilateral  . Tonsillectomy and adenoidectomy    . Esophagogastroduodenoscopy N/A 03/22/2014    Procedure: ESOPHAGOGASTRODUODENOSCOPY (EGD) Esophageal dilation;  Surgeon: Rogene Houston, MD;  Location: [facility];  Service: Endoscopy;  Laterality: N/A;  Venia Minks dilation N/A 03/22/2014    Procedure: Venia Minks DILATION;  Surgeon: Rogene Houston, MD;  Location: [facility]  SUITE;  Service: Endoscopy;  Laterality: N/A;   Prior to Admission medications   Medication Sig Start Date End Date Taking? Authorizing Provider  acetaminophen (TYLENOL) 325 MG tablet Take 650 mg by mouth every 4 (four) hours as needed for mild pain.   Yes Historical Provider, MD  ALPRAZolam Duanne Moron) 0.5 MG tablet Take 0.5 mg by mouth 3 (three) times daily.   Yes Historical Provider, MD  amLODipine (NORVASC) 2.5 MG tablet Take 2.5 mg by mouth daily.   Yes Historical Provider, MD  divalproex (DEPAKOTE) 500 MG DR tablet Take 500 mg by mouth 3 (three) times  daily.    Yes Historical Provider, MD  folic acid (FOLVITE) 1 MG tablet Take 1 mg by mouth daily.   Yes Historical Provider, MD  furosemide (LASIX) 40 MG tablet Take 1 tablet (40 mg total) by mouth daily. 01/21/12  Yes Eugene C Serpe, PA-C  ipratropium-albuterol (DUONEB) 0.5-2.5 (3) MG/3ML SOLN Take 3 mLs by nebulization every 6 (six) hours as needed (for shortness of breath/wheezing).   Yes Historical Provider, MD  isosorbide dinitrate (ISORDIL) 20 MG tablet Take 60 mg by mouth 3 (three) times daily.   Yes Historical Provider, MD  levothyroxine (SYNTHROID, LEVOTHROID) 175 MCG tablet Take 175 mcg by mouth daily before breakfast.   Yes Historical Provider, MD  metoprolol tartrate (LOPRESSOR) 25 MG tablet Take 25 mg by mouth 2 (two) times daily.   Yes Historical Provider, MD  Olopatadine HCl (PATADAY) 0.2 % SOLN Apply 1 drop to eye every morning.   Yes Historical Provider, MD  pantoprazole (PROTONIX) 40 MG tablet Take 40 mg by mouth 2 (two) times daily.   Yes Historical Provider, MD  Polyethyl Glycol-Propyl Glycol (SYSTANE) 0.4-0.3 % GEL Apply 1 drop to eye 3 (three) times daily.   Yes Historical Provider, MD  potassium chloride SA (K-DUR,KLOR-CON) 20 MEQ tablet Take 20 mEq by mouth daily.   Yes Historical Provider, MD  pregabalin (LYRICA) 100 MG capsule Take one capsule by mouth every eight hours for neuropathic pain R/T DM 02/18/14  Yes Tiffany L Reed, DO  Rivaroxaban (XARELTO) 20 MG TABS tablet Take 20 mg by mouth daily with supper.    Yes Historical Provider, MD  rosuvastatin (CRESTOR) 20 MG tablet Take 20 mg by mouth daily.   Yes Historical Provider, MD  sertraline (ZOLOFT) 50 MG tablet Take 150 mg by mouth at bedtime.   Yes Historical Provider, MD  sucralfate (CARAFATE) 1 G tablet Take 1 g by mouth 2 (two) times daily before a meal.   Yes Historical Provider, MD  Vitamin D, Ergocalciferol, (DRISDOL) 50000 UNITS CAPS capsule Take 50,000 Units by mouth every 30 (thirty) days. *Administered on the  12th of each month   Yes Historical Provider, MD  loperamide (IMODIUM) 2 MG capsule Take 2 mg by mouth as needed for diarrhea or loose stools.    Historical Provider, MD  nitroGLYCERIN (NITROSTAT) 0.4 MG SL tablet Place 0.4 mg under the tongue every 5 (five) minutes as needed for chest pain.    Historical Provider, MD  oxyCODONE (ROXICODONE) 15 MG immediate release tablet Take 7.5 mg by mouth every 6 (six) hours as needed for pain (*Only given as needed for pain greater than 5).    Historical Provider, MD  Suvorexant 5 MG TABS Take 1 tablet by mouth at bedtime. BELSOMRA 5MG     Historical Provider, MD   Allergies  Allergen Reactions  . Morphine And Related Other (See Comments)    Per MAR  .  Sulfate     FAMILY HISTORY:  Family History  Problem Relation Age of Onset  . Diabetes Mother    SOCIAL HISTORY:  reports that he quit smoking about 23 years ago. His smoking use included Cigarettes. He has a 25 pack-year smoking history. He has never used smokeless tobacco. He reports that he does not drink alcohol or use illicit drugs.  REVIEW OF SYSTEMS:  Unable to obtain 2/2 expressive aphasia  SUBJECTIVE: Doing well, respiratory status improved from prior (per sister)  VITAL SIGNS: Temp:  [96.3 F (35.7 C)-99.5 F (37.5 C)] 98.7 F (37.1 C) (12/11 2200) Pulse Rate:  [57-71] 63 (12/11 2200) Resp:  [19-37] 24 (12/11 2200) BP: (86-169)/(40-110) 122/96 mmHg (12/11 2200) SpO2:  [81 %-97 %] 95 % (12/11 2200) FiO2 (%):  [70 %-90 %] 90 % (12/11 1509) Weight:  [186 lb 4.6 oz (84.5 kg)-188 lb 7.9 oz (85.5 kg)] 186 lb 4.6 oz (84.5 kg) (12/11 1858) HEMODYNAMICS:   VENTILATOR SETTINGS: Vent Mode:  [-] BIPAP FiO2 (%):  [70 %-90 %] 90 % Set Rate:  [15 bmp] 15 bmp INTAKE / OUTPUT: Intake/Output      12/11 0701 - 12/12 0700   P.O. 0   I.V. (mL/kg) 1172.9 (13.9)   IV Piggyback 500   Total Intake(mL/kg) 1672.9 (19.8)   Urine (mL/kg/hr) 1450 (1.1)   Total Output 1450   Net +222.9          PHYSICAL EXAMINATION: General:  Middle aged man lying in bed with BiPAP in place with good cooperation Neuro:  Expressive aphasia and some right hemiparesis. Able to communicate, but with garbled speech. HEENT: MMM Neck: No extensive JVD, small hepatojugular reflex. Cardiovascular:  No M/R/G Lungs:  Clear laterally and anteriorly..  Abdomen:  Obese Musculoskeletal:  No deformities Skin:  Several scars noted on UE.  LABS:  CBC  Recent Labs Lab 03/24/2014 1818 03/28/14 0429 03/29/14 0432  WBC 28.9* 33.8* 16.7*  HGB 15.3 12.2* 12.6*  HCT 47.0 37.8* 37.9*  PLT 190 150 143*   Coag's No results for input(s): APTT, INR in the last 168 hours. BMET  Recent Labs Lab 03/19/2014 1935 03/28/14 0429 03/29/14 0432  NA 140 142 139  K 4.8 4.6 4.2  CL 100 105 103  CO2 25 24 26   BUN 30* 31* 25*  CREATININE 1.12 0.82 0.64  GLUCOSE 208* 171* 82   Electrolytes  Recent Labs Lab 03/20/2014 1935 03/28/14 0429 03/29/14 0432  CALCIUM 8.5 8.1* 8.3*   Sepsis Markers  Recent Labs Lab 03/28/2014 1825 04/06/2014 2218 03/28/14 1052  LATICACIDVEN 4.35* 4.29* 1.5   ABG  Recent Labs Lab 03/28/14 1400 03/29/14 0513 03/29/14 2036  PHART 7.350 7.437 7.435  PCO2ART 49.0* 38.6 34.8*  PO2ART 66.8* 56.7* 62.0*   Liver Enzymes  Recent Labs Lab 03/28/2014 1935 03/28/14 0429  AST 21 33  ALT 5 <5  ALKPHOS 39 35*  BILITOT 0.5 0.4  ALBUMIN 2.3* 2.0*   Cardiac Enzymes  Recent Labs Lab 04/15/2014 1935  TROPONINI <0.30  PROBNP 2710.0*   Glucose  Recent Labs Lab 03/29/14 0002 03/29/14 0415 03/29/14 0753 03/29/14 1207 03/29/14 1614 03/29/14 1853  GLUCAP 126* 75 91 105* 132* 137*    Imaging Ct Chest W Contrast  03/29/2014   CLINICAL DATA:  68 year old with sepsis and pneumonia  EXAM: CT CHEST WITH CONTRAST  TECHNIQUE: Multidetector CT imaging of the chest was performed during intravenous contrast administration.  CONTRAST:  33mL OMNIPAQUE IOHEXOL 300 MG/ML  SOLN  COMPARISON:  02/28/2014  FINDINGS: Heart/mediastinum: The heart is mildly enlarged. There is no pericardial effusion. Atherosclerotic aortic and coronary artery calcifications are present.  Several enlarged mediastinal lymph nodes are present for reference, a 1.4 cm precarinal lymph node is noted. The patient is status post prior CABG, median sternotomy wires are noted.  Chest: There has been interval progression of diffuse nodular/patchy areas of consolidation involving the entire hemithorax. Small bilateral pleural effusions are present. The right effusion appears to contain small areas of loculation. There is no pneumothorax.  Upper abdomen: The partially visualized upper abdomen demonstrates an unchanged 1.4 cm left adrenal nodule. 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 Surgical clips are noted within the gallbladder fossa, compatible with prior cholecystectomy.  Osseous structures: Multilevel degenerative changes of the spine are present  IMPRESSION: 1. Worsening diffuse patchy and nodular areas of consolidation involving all lung fields. Findings are concerning for progressive pneumonitis/ARDS. Clinical correlation is recommended. 2. Mediastinal adenopathy is likely reactive. Follow-up is suggested. 3. Cardiomegaly. 4. Small bilateral pleural effusions.   Electronically Signed   By: Rosemarie Ax   On: 03/29/2014 11:21    EKG: NSR CXR: Patchy infiltrate with lower lobe consolidation and small bilateral effusions.  ASSESSMENT / PLAN:  Principal Problem:   Sepsis Active Problems:   Essential hypertension, benign   Hyperlipidemia   Obstructive sleep apnea   Paroxysmal atrial fibrillation   Acute respiratory failure with hypoxia   Encephalopathy   HCAP (healthcare-associated pneumonia)   Lactic acidosis   Well controlled type 2 diabetes mellitus   Seizures   Diskitis   PULMONARY A: Hypoxic respiratory failure with bilateral patchy  infiltrate  P:   Certainly pneumonia is possible given presenting fever and elevated WBC. Given chronicity of CT findings, favor another process such as ILD, COP, or malignancy. Could consider bronchoscopy to better evaluate, but will elect to hold off for now based on family discussion (documented below). Will aggressive diurese to optimize lung function (there is likely some, given dependent nature of infiltrates and effusions). It is striking that his exam is not consistent with overt volume overload and minimal findings on auscultation.  CARDIOVASCULAR A: History of CAD Pulmonary edema, favor 2/2 decompensated systolic dysfunction. P:   Will aggressively diurese with lasix 40 mg q6h.  RENAL A: No acute issues P:    GASTROINTESTINAL A: Gastritis History of colon CA Weight loss Malnutrition P:   Chronic issues; will continue protonix 40 mg BID  HEMATOLOGIC A: History of Colon CA Iron def anemia P:   H&H stable; no acute issues  INFECTIOUS A: Possible HCAP P:   Afebrile for several days, WBC trending down. Continue vanc/cefepime/levofloxacin to complete 10 days.  ENDOCRINE A: Hypothryoid P:   Continue TSH  NEUROLOGIC A: Seizure d/o Prior stroke Expressive aphasia P:   Had previously been anticoagulated (hx AF w/ high CHADS2VASC score). Holding given acute illness; could consider restarting.  BEST PRACTICE / DISPOSITION Level of Care:  ICU Primary Service:  PCCM Consultants:  None Code Status:  DNR Diet:  NPO DVT Px:  Enoxaparin GI Px:  PPI (BID) Skin Integrity:  Per RN documentation Social / Family:  Updated on admission.  Family meeting Note: I had a long discussion with the patient's sister (documented POA) and brother in law re: the patient's code status and overall goals of care. While it is difficult to prognosticate when the cause of his progressive decline as well as  acute / subacute respiratory decline are not definitively known, he does seem  to be nearing the end of his life. The sister indicated that the patient had a poor quality of life since losing some independence at the SNF, and she does not believe that he would want to live permanently with this (or lower) quality of life. I offered full comfort measures only, conservative measures only (with DNR/DNI status), or full aggressive care. They elected for conservative measures (ABX, lasix, BiPAP, etc), and elected for DNR/DNI status. Should the patient improve considerably, they would be open to reversing his code status, but for now, recognize that he has a limited chance of a meaningful recovery.   TODAY'S SUMMARY: 68 y/o man with progressive decline presenting with respiratory failure and worsening infiltrate on chest CT.  I have personally obtained a history, examined the patient, evaluated laboratory and imaging results, formulated the assessment and plan and placed orders.  CRITICAL CARE: The patient is critically ill with multiple organ systems failure and requires high complexity decision making for assessment and support, frequent evaluation and titration of therapies, application of advanced monitoring technologies and extensive interpretation of multiple databases. Critical Care Time devoted to patient care services described in this note is 140 minutes.   Luz Brazen, MD Pulmonary and [place] Pager: [phone]   03/29/2014, 10:46 PM

## 2014-03-29 NOTE — Clinical Documentation Improvement (Signed)
Supporting Information: Patient was admitted earlier this month for Diastolic Heart Failure exacerbation per 12/10 progress notes.  Labs: 12/09: proBNP: 2,710.0.    Possible Diagnosis? --Acute Diastolic Heart Failure --Chronic Diastolic Heart Failure --Acute on Chronic Diastolic Heart Failure --Other (specify)   Thank You,  Russ Halo Documentation Specialist [phone] [email or web address]

## 2014-03-29 NOTE — Progress Notes (Signed)
Additional lab work ordered by MD not taken at this time. Courier for lab draws will not arrive at [facility] until [place]. Patient will be at [facility] by then. Will pass on to RN to get labs drawn.

## 2014-03-29 NOTE — Progress Notes (Signed)
PROGRESS NOTE  [name] JEH:[id number] DOB: 1946-04-07 DOA: 04/02/2014 PCP: Glenda Chroman., MD   Summary: 68 year old man presented from [facility] with fever and profound hypoxia. Admitted for sepsis with hypotension, fever, acute respiratory failure on BiPAP secondary to pneumonia. Admitted earlier this month for diastolic heart failure exacerbation.  Assessment/Plan: 1. Sepsis secondary to presumed healthcare associated pneumonia with associated hypotension, fever and BiPAP dependent respiratory failure on admission. CT chest 02/28/2014 revealed extensive nodular airspace opacities throughout both lungs concerning for atypical infection or drug reaction. Chest x-ray 11/30 showed cardiomegaly with moderate pulmonary edema. Chest x-ray this admission 12/9 without significant change compared to his previous study. 2. Acute hypoxic respiratory failure requiring BiPAP on admission. Repeat ABG shows normalization of pH, compensated hypercapnia. Hypoxemia noted. Overall somewhat improved. 3. Diabetes mellitus type 2, stable. 4. Seizure disorder, quiescent 5. Paroxysmal atrial fibrillation on Xarelto. 6. History of coronary artery disease 7. 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 History of diastolic congestive heart failure grade 1 9. Recently diagnosed gastric ulcer, continue PPI twice a day.   Clinically he appears better, he is very awake and follows commands briskly however he remains borderline hypotensive, hypoxic and currently is on BiPAP again. Leukocytosis has improved. He remains critically ill but there is no indication for intubation at the moment. Plan to continue current antibiotics, administer IV fluids, speech therapy evaluation today.  Given abnormality seen on chest CT last month, planned repeat chest CT today for further evaluation and exclude complicating factors.  Change to oral medications if can tolerate diet.  Monitor volume status.  Speech  evaluation today.  Resume Xarelto when able to tolerate diet.  Code Status: full code DVT prophylaxis: Lovenox. Resume Xarelto when can take PO Family Communication:  Disposition Plan: return to [facility]?  Murray Hodgkins, MD  [facility]  Pager [phone] If 7PM-7AM, please contact night-coverage at [email or web address], password [email or web address] 03/29/2014, 8:06 AM  LOS: 2 days   Consultants:  Pulmonology  Procedures:    Antibiotics:  Cefepime 12/9 >>  Vancomycin 12/9 >>  HPI/Subjective: Was able to come off BiPAP yesterday afternoon. On BiPAP again last night.   He denies complaints at this point. BiPAP in place.  Objective: Filed Vitals:   03/29/14 0300 03/29/14 0400 03/29/14 0500 03/29/14 0600  BP: 115/48 113/47 107/40 95/56  Pulse: 66 67 66 68  Temp:  99.5 F (37.5 C)    TempSrc:  Core (Comment)    Resp: 21 27 22 22   Height:      Weight:   85.5 kg (188 lb 7.9 oz)   SpO2: 93% 91% 92% 89%    Intake/Output Summary (Last 24 hours) at 03/29/14 0806 Last data filed at 03/29/14 0600  Gross per 24 hour  Intake   2658 ml  Output   1900 ml  Net    758 ml     Filed Weights   03/30/2014 1817 04/12/2014 2330 03/29/14 0500  Weight: 80.74 kg (178 lb) 80.8 kg (178 lb 2.1 oz) 85.5 kg (188 lb 7.9 oz)    Exam:     Appears calm, comfortable. On BiPAP. Start blood pressure 90-111 respiratory rate in the 20s. Oxygenation 90%.  Gen. Appears calm, comfortable.  Alert. Speech fluent, somewhat difficult to understand on BiPAP. Follows commands briskly.  Musculoskeletal. Moves all extremities well with good strength.  Cardiovascular regular rate and rhythm. No murmur, rub or gallop. No lower extremity edema. Telemetry sinus rhythm.  Respiratory clear to auscultation  bilaterally, diminished breath sounds. No frank wheezes, rales or rhonchi. Mild increased respiratory effort.  Abdomen soft.  Skin appears grossly unremarkable.  Data Reviewed:  Urine output  1950  Capillary blood sugar stable.  ABG 7.43/38/56  Lactic acid normalized yesterday, 1.5  WBC 28.9 >> 33.8 >> 16.7  Pertinent data   Influenza screen negative.  Blood cultures and urine culture pending.  Scheduled Meds: . antiseptic oral rinse  7 mL Mouth Rinse BID  . antiseptic oral rinse  7 mL Mouth Rinse q12n4p  . ceFEPime (MAXIPIME) IV  1 g Intravenous Q8H  . chlorhexidine  15 mL Mouth Rinse BID  . enoxaparin (LOVENOX) injection  40 mg Subcutaneous Daily  . insulin aspart  0-9 Units Subcutaneous 6 times per day  . levothyroxine  75 mcg Intravenous QAC breakfast  . metoprolol  5 mg Intravenous 4 times per day  . pantoprazole (PROTONIX) IV  40 mg Intravenous Q12H  . sodium chloride  3 mL Intravenous Q12H  . valproate sodium  500 mg Intravenous 3 times per day  . vancomycin  1,000 mg Intravenous Q12H   Continuous Infusions: . sodium chloride 75 mL/hr at 03/29/14 0500    Principal Problem:   Sepsis Active Problems:   Essential hypertension, benign   Hyperlipidemia   Obstructive sleep apnea   Paroxysmal atrial fibrillation   Acute respiratory failure with hypoxia   Encephalopathy   HCAP (healthcare-associated pneumonia)   Lactic acidosis   Well controlled type 2 diabetes mellitus   Seizures   Diskitis   Time spent 30 minutes

## 2014-03-29 NOTE — Clinical Social Work Psychosocial (Signed)
Clinical Social Work Department BRIEF PSYCHOSOCIAL ASSESSMENT 03/29/2014  Patient:  [name], [name]     Account Number:  [id number]     Admit date:  04/13/2014  Clinical Social Worker:  [name]  Date/Time:  03/29/2014 01:06 PM  Referred by:  CSW  Date Referred:  03/29/2014 Referred for  SNF Placement   Other Referral:   Interview type:   Other interview type:   [name]    PSYCHOSOCIAL DATA Living Status:  FACILITY Admitted from facility:  [facility] Level of care:  [place] Primary support name:  [name] Primary support relationship to patient:  FAMILY Degree of support available:   Sister is very supportive    CURRENT CONCERNS Current Concerns  Post-Acute Placement   Other Concerns:    SOCIAL WORK ASSESSMENT / PLAN Patient was unable to participate in assessment due to his current condition. CSW spoke to patient's sister, POA. She indicated that patient's wife died about three years ago, and patient was left unable to take care of his meals and medications.  She stated that during this time an APS report was made as patient's daughter was supposed to be caring for patient but was not. She stated that patient APS contacted her and she stepped in and became POA.  She stated that she picks patient up and takes him out. She stated that he uses a walker but when they are out shopping he uses a wheelchair. She stated that they go on outings every other week.  She stated that patient is not disoriented but he has had a stroke and his has a difficult time finding the right words at time.  She stated that patient has been at [facility] for approximately 2.5 years. She stated that she is very pleased with the care given to him there, but she would like to have him moved to [facility] if possible to be closer to her.  [name] indicated that if he cannot  be placed at [facility] then she wants him to go back to [facility].  CSW contacted  [name] at [facility]. [name] indicated that there were no beds available at the time for a long term resident.  CSW advised [name] that there were not beds available at [facility].  CSW spoke to [name] at [facility]. She confirmed [name]'s statements'. She stated that patient is a one aid assist and classified as a fall risk.  She stated  that he is also HOH.  She indicated that patient can return to the facility upon discharge.   Assessment/plan status:   Other assessment/ plan:   Information/referral to community resources:    PATIENT'S/FAMILY'S RESPONSE TO PLAN OF CARE:  [name], [name]

## 2014-03-29 NOTE — Progress Notes (Signed)
SLP Cancellation Note  Patient Details Name: [name] MRN: [id number] DOB: 1946/01/15   Cancelled treatment:       Reason Eval/Treat Not Completed: Medical issues which prohibited therapy. Pt still on BiPAP; MD noted condition worsening- now concerning for progressive pneumonitis. Will evaluate swallow function when medically appropriate.   Kern Reap, MA, CCC-SLP 03/29/2014, 5:16 PM

## 2014-03-29 NOTE — Progress Notes (Signed)
RT transported patient to and from CT with no complications. Vitals remained stable throughout

## 2014-03-30 ENCOUNTER — Inpatient Hospital Stay (HOSPITAL_COMMUNITY): Payer: Medicare Other

## 2014-03-30 DIAGNOSIS — J189 Pneumonia, unspecified organism: Secondary | ICD-10-CM | POA: Insufficient documentation

## 2014-03-30 DIAGNOSIS — M4644 Discitis, unspecified, thoracic region: Secondary | ICD-10-CM | POA: Insufficient documentation

## 2014-03-30 DIAGNOSIS — Z66 Do not resuscitate: Secondary | ICD-10-CM

## 2014-03-30 LAB — BASIC METABOLIC PANEL
ANION GAP: 15 (ref 5–15)
BUN: 22 mg/dL (ref 6–23)
CO2: 24 mEq/L (ref 19–32)
Calcium: 8.9 mg/dL (ref 8.4–10.5)
Chloride: 97 mEq/L (ref 96–112)
Creatinine, Ser: 0.58 mg/dL (ref 0.50–1.35)
GFR calc Af Amer: >90 mL/min (ref >90)
GLUCOSE: 144 mg/dL — AB (ref 70–99)
POTASSIUM: 3.8 meq/L (ref 3.7–5.3)
SODIUM: 136 meq/L — AB (ref 137–147)

## 2014-03-30 LAB — POCT I-STAT 3, ART BLOOD GAS (G3+)
ACID-BASE EXCESS: 1 mmol/L (ref 0.0–2.0)
Bicarbonate: 22.8 mEq/L (ref 20.0–24.0)
O2 SAT: 98 %
PO2 ART: 87 mmHg (ref 80.0–100.0)
Patient temperature: 98.1
TCO2: 24 mmol/L (ref 0–100)
pCO2 arterial: 26.7 mmHg — ABNORMAL LOW (ref 35.0–45.0)
pH, Arterial: 7.538 — ABNORMAL HIGH (ref 7.350–7.450)

## 2014-03-30 LAB — GLUCOSE, CAPILLARY
GLUCOSE-CAPILLARY: 143 mg/dL — AB (ref 70–99)
GLUCOSE-CAPILLARY: 170 mg/dL — AB (ref 70–99)
Glucose-Capillary: 141 mg/dL — ABNORMAL HIGH (ref 70–99)
Glucose-Capillary: 168 mg/dL — ABNORMAL HIGH (ref 70–99)
Glucose-Capillary: 170 mg/dL — ABNORMAL HIGH (ref 70–99)
Glucose-Capillary: 191 mg/dL — ABNORMAL HIGH (ref 70–99)

## 2014-03-30 LAB — PROCALCITONIN: PROCALCITONIN: 2.88 ng/mL

## 2014-03-30 MED ORDER — VANCOMYCIN HCL 10 G IV SOLR
1250.0000 mg | Freq: Two times a day (BID) | INTRAVENOUS | Status: DC
  Administered 2014-03-30 – 2014-03-31 (×2): 1250 mg via INTRAVENOUS
  Filled 2014-03-30 (×3): qty 1250

## 2014-03-30 MED ORDER — METHYLPREDNISOLONE SODIUM SUCC 125 MG IJ SOLR
60.0000 mg | Freq: Four times a day (QID) | INTRAMUSCULAR | Status: DC
  Administered 2014-03-30 – 2014-04-02 (×12): 60 mg via INTRAVENOUS
  Filled 2014-03-30 (×8): qty 0.96
  Filled 2014-03-30: qty 2
  Filled 2014-03-30 (×5): qty 0.96
  Filled 2014-03-30: qty 2
  Filled 2014-03-30 (×4): qty 0.96

## 2014-03-30 MED ORDER — STARCH (THICKENING) PO POWD
ORAL | Status: DC | PRN
  Filled 2014-03-30: qty 227

## 2014-03-30 MED ORDER — FUROSEMIDE 10 MG/ML IJ SOLN
40.0000 mg | Freq: Three times a day (TID) | INTRAMUSCULAR | Status: AC
  Administered 2014-03-30 – 2014-03-31 (×3): 40 mg via INTRAVENOUS
  Filled 2014-03-30: qty 4

## 2014-03-30 NOTE — Progress Notes (Signed)
ANTIBIOTIC CONSULT NOTE - FOLLOW UP  Pharmacy Consult for Vancomycin, Cefepime, Levaquin Indication: pneumonia  Allergies  Allergen Reactions  . Morphine And Related Other (See Comments)    Per MAR  . Sulfate     Patient Measurements: Height: 5\' 9"  (175.3 cm) Weight: 186 lb 4.6 oz (84.5 kg) IBW/kg (Calculated) : 70.7  Vital Signs: Temp: 97.7 F (36.5 C) (12/12 1137) Temp Source: Oral (12/12 1137) BP: 134/65 mmHg (12/12 1213) Pulse Rate: 63 (12/12 1213) Intake/Output from previous day: 12/11 0701 - 12/12 0700 In: 2377.9 [I.V.:1262.9; IV Piggyback:1115] Out: 2775 [Urine:2775] Intake/Output from this shift: Total I/O In: 10 [I.V.:10] Out: 200 [Urine:200]  Labs:  Recent Labs  03/20/2014 1818 03/23/2014 1935 03/28/14 0429 03/29/14 0432  WBC 28.9*  --  33.8* 16.7*  HGB 15.3  --  12.2* 12.6*  PLT 190  --  150 143*  CREATININE  --  1.12 0.82 0.64   Estimated Creatinine Clearance: 88.4 mL/min (by C-G formula based on Cr of 0.64).  Recent Labs  03/29/14 0432  [place] 12.1     Microbiology: Recent Results (from the past 720 hour(s))  MRSA PCR Screening     Status: Abnormal   Collection Time: 03/19/14  2:53 AM  Result Value Ref Range Status   MRSA by PCR POSITIVE (A) NEGATIVE Final    Comment: CRITICAL RESULT CALLED TO, READ BACK BY AND VERIFIED WITH: SHONEWITZ,L AT 9:50AM ON 03/19/14 BY FESTERMAN,C        The GeneXpert MRSA Assay (FDA approved for NASAL specimens only), is one component of a comprehensive MRSA colonization surveillance program. It is not intended to diagnose MRSA infection nor to guide or monitor treatment for MRSA infections.   Blood Culture (routine x 2)     Status: None (Preliminary result)   Collection Time: 04/16/2014  6:18 PM  Result Value Ref Range Status   Specimen Description BLOOD LEFT ARM DRAWN BY RN GM  Final   Special Requests BOTTLES DRAWN AEROBIC ONLY 4CC  Final   Culture NO GROWTH 1 DAY  Final   Report Status PENDING   Incomplete  Urine culture     Status: None   Collection Time: 04/11/2014  6:49 PM  Result Value Ref Range Status   Specimen Description URINE, CATHETERIZED  Final   Special Requests NONE  Final   Culture  Setup Time   Final    03/28/2014 00:38 Performed at [place] Performed at [facility]   Final   Culture NO GROWTH Performed at [facility]   Final   Report Status 03/28/2014 FINAL  Final  Blood Culture (routine x 2)     Status: None (Preliminary result)   Collection Time: 04/09/2014  6:50 PM  Result Value Ref Range Status   Specimen Description BLOOD LEFT ARM  Final   Special Requests BOTTLES DRAWN AEROBIC ONLY 4CC  Final   Culture NO GROWTH 1 DAY  Final   Report Status PENDING  Incomplete  Culture, sputum-assessment     Status: None   Collection Time: 03/28/14  8:20 PM  Result Value Ref Range Status   Specimen Description TRACHEAL SITE  Final   Special Requests NONE  Final   Sputum evaluation   Final    THIS SPECIMEN IS ACCEPTABLE. RESPIRATORY CULTURE REPORT TO FOLLOW.   Report Status 03/28/2014 FINAL  Final  Culture, respiratory (NON-Expectorated)     Status: None (Preliminary result)   Collection Time:  03/28/14  8:20 PM  Result Value Ref Range Status   Specimen Description TRACHEAL SITE  Final   Special Requests NONE  Final   Gram Stain   Final    RARE WBC PRESENT,BOTH PMN AND MONONUCLEAR RARE SQUAMOUS EPITHELIAL CELLS PRESENT NO ORGANISMS SEEN Performed at [facility]    Culture PENDING  Incomplete   Report Status PENDING  Incomplete    Anti-infectives    Start     Dose/Rate Route Frequency Ordered Stop   03/29/14 1500  vancomycin (VANCOCIN) IVPB 1000 mg/200 mL premix     1,000 mg200 mL/hr over 60 Minutes Intravenous Every 8 hours 03/29/14 1135     03/29/14 1500  levofloxacin (LEVAQUIN) IVPB 750 mg     750 mg100 mL/hr over 90 Minutes Intravenous Every 24 hours 03/29/14 1405     03/28/14 0300   ceFEPIme (MAXIPIME) 1 g in dextrose 5 % 50 mL IVPB     1 g100 mL/hr over 30 Minutes Intravenous Every 8 hours 04/16/2014 1818     03/21/2014 2200  ceFEPIme (MAXIPIME) 1 g in dextrose 5 % 50 mL IVPB  Status:  Discontinued     1 g100 mL/hr over 30 Minutes Intravenous 3 times per day 04/04/2014 2151 04/04/2014 2356   04/10/2014 1900  vancomycin (VANCOCIN) IVPB 1000 mg/200 mL premix  Status:  Discontinued     1,000 mg200 mL/hr over 60 Minutes Intravenous Every 12 hours 03/31/2014 1816 03/29/14 1135   03/26/2014 1815  ceFEPIme (MAXIPIME) 2 g in dextrose 5 % 50 mL IVPB     2 g100 mL/hr over 30 Minutes Intravenous  Once 04/18/2014 1808 03/29/2014 1946   03/26/2014 1815  vancomycin (VANCOCIN) IVPB 1000 mg/200 mL premix  Status:  Discontinued     1,000 mg200 mL/hr over 60 Minutes Intravenous  Once 04/17/2014 1808 04/02/2014 1816      Assessment: 68 year old male on Day #4 of empiric antibiotics for possible HCAP.  His Vancomycin regimen was adjusted 12/11 due to a low trough and I will adjust it further with consideration of his debilitated state and renal function estimates to target a goal trough of ~15.  Goal of Therapy:  Vancomycin trough level 15-20 mcg/ml  Plan:  Change Vancomycin to 1250mg  IV q12h Continue Cefepime at 1gm IV q8h Continue Levaquin 750mg  IV q24h In the absence of positive micro data may consider narrowing antibiotics to complete an empiric treatment course.  Legrand Como, Pharm.D., BCPS, AAHIVP Clinical Pharmacist Phone: [phone] or [phone] 03/30/2014, 12:38 PM

## 2014-03-30 NOTE — Progress Notes (Signed)
Dr. Halford Chessman made aware of patient initially sinus brady then became tachycardic in the 140's around 0340, then maintained in the 110's. Will continue to monitor patient.  Desmond Dike RN

## 2014-03-30 NOTE — Evaluation (Signed)
SLP Cancellation Note  Patient Details Name: [name] MRN: [id number] DOB: 09-Jun-1945   Cancelled treatment:       Reason Eval/Treat Not Completed:  (pt on vent currently, will follow for readiness for BSE)   Claudie Fisherman, [place] [facility] SLP [phone]

## 2014-03-30 NOTE — Progress Notes (Signed)
PULMONARY / CRITICAL CARE MEDICINE    Name: [name] MRN: [id number] DOB: 1945-12-16    ADMISSION DATE:  03/28/2014  PRIMARY SERVICE: PCCM  CHIEF COMPLAINT:  Dyspnea  BRIEF PATIENT DESCRIPTION: 68 y/o man with poor functional status and overall progressive decline presented to [facility] 12/9 with dyspnea and hypoxia,  Admitted and treated for HCAP.  Progressive respiratory failure concerning for ARDS.  Tx 12/11 to [facility] for PCCM mgmt and ? FOB.    Of note, the patient has a brother with recently diagnosed ILD (per sister, is presumed IPF and was started on either pirfenidone and nintedanib).  SIGNIFICANT EVENTS / STUDIES:  12/11>>Transferred from [facility], stable on BiPAP. 12/11>> family meeting, DNR/DNI 12/12-worsening resp status  SUBJECTIVE: now on BIPAp continously  VITAL SIGNS: Temp:  [96.3 F (35.7 C)-100.1 F (37.8 C)] 97.7 F (36.5 C) (12/12 1137) Pulse Rate:  [57-104] 64 (12/12 0811) Resp:  [19-37] 27 (12/12 0811) BP: (98-169)/(52-110) 145/68 mmHg (12/12 0811) SpO2:  [86 %-97 %] 93 % (12/12 0811) FiO2 (%):  [90 %] 90 % (12/12 0810) Weight:  [186 lb 4.6 oz (84.5 kg)] 186 lb 4.6 oz (84.5 kg) (12/11 1858) HEMODYNAMICS:   VENTILATOR SETTINGS: Vent Mode:  [-] BIPAP FiO2 (%):  [90 %] 90 % Set Rate:  [15 bmp] 15 bmp INTAKE / OUTPUT: Intake/Output      12/11 0701 - 12/12 0700 12/12 0701 - 12/13 0700   P.O. 0    I.V. (mL/kg) 1262.9 (14.9) 10 (0.1)   IV Piggyback 1115    Total Intake(mL/kg) 2377.9 (28.1) 10 (0.1)   Urine (mL/kg/hr) 2775 (1.4) 200 (0.5)   Total Output 2775 200   Net -397.1 -190          PHYSICAL EXAMINATION: General:  Middle aged man lying in bed with BiPAP in place with good cooperation Neuro:  Expressive aphasia and some right hemiparesis. Able to communicate, but with garbled speech. HEENT: MMM Neck: No extensive JVD, small hepatojugular reflex. Cardiovascular:  No M/R/G Lungs:  Clear laterally and anteriorly..  Abdomen:   Obese Musculoskeletal:  No deformities Skin:  Several scars noted on UE.  LABS:  CBC  Recent Labs Lab 03/30/2014 1818 03/28/14 0429 03/29/14 0432  WBC 28.9* 33.8* 16.7*  HGB 15.3 12.2* 12.6*  HCT 47.0 37.8* 37.9*  PLT 190 150 143*   Coag's No results for input(s): APTT, INR in the last 168 hours. BMET  Recent Labs Lab 03/31/2014 1935 03/28/14 0429 03/29/14 0432  NA 140 142 139  K 4.8 4.6 4.2  CL 100 105 103  CO2 25 24 26   BUN 30* 31* 25*  CREATININE 1.12 0.82 0.64  GLUCOSE 208* 171* 82   Electrolytes  Recent Labs Lab 03/26/2014 1935 03/28/14 0429 03/29/14 0432  CALCIUM 8.5 8.1* 8.3*   Sepsis Markers  Recent Labs Lab 04/08/2014 1825 03/26/2014 2218 03/28/14 1052  LATICACIDVEN 4.35* 4.29* 1.5   ABG  Recent Labs Lab 03/29/14 0513 03/29/14 2036 03/30/14 0306  PHART 7.437 7.435 7.538*  PCO2ART 38.6 34.8* 26.7*  PO2ART 56.7* 62.0* 87.0   Liver Enzymes  Recent Labs Lab 03/28/2014 1935 03/28/14 0429  AST 21 33  ALT 5 <5  ALKPHOS 39 35*  BILITOT 0.5 0.4  ALBUMIN 2.3* 2.0*   Cardiac Enzymes  Recent Labs Lab 03/29/2014 1935  TROPONINI <0.30  PROBNP 2710.0*   Glucose  Recent Labs Lab 03/29/14 1614 03/29/14 1853 03/29/14 2349 03/30/14 0359 03/30/14 0717 03/30/14 1128  GLUCAP 132* 137* 191*  170* 168* 141*    Imaging Ct Chest W Contrast  03/29/2014   CLINICAL DATA:  68 year old with sepsis and pneumonia  EXAM: CT CHEST WITH CONTRAST  TECHNIQUE: Multidetector CT imaging of the chest was performed during intravenous contrast administration.  CONTRAST:  57mL OMNIPAQUE IOHEXOL 300 MG/ML  SOLN  COMPARISON:  02/28/2014  FINDINGS: Heart/mediastinum: The heart is mildly enlarged. There is no pericardial effusion. Atherosclerotic aortic and coronary artery calcifications are present.  Several enlarged mediastinal lymph nodes are present for reference, a 1.4 cm precarinal lymph node is noted. The patient is status post prior CABG, median sternotomy wires  are noted.  Chest: There has been interval progression of diffuse nodular/patchy areas of consolidation involving the entire hemithorax. Small bilateral pleural effusions are present. The right effusion appears to contain small areas of loculation. There is no pneumothorax.  Upper abdomen: The partially visualized upper abdomen demonstrates an unchanged 1.4 cm left adrenal nodule. 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 Surgical clips are noted within the gallbladder fossa, compatible with prior cholecystectomy.  Osseous structures: Multilevel degenerative changes of the spine are present  IMPRESSION: 1. Worsening diffuse patchy and nodular areas of consolidation involving all lung fields. Findings are concerning for progressive pneumonitis/ARDS. Clinical correlation is recommended. 2. Mediastinal adenopathy is likely reactive. Follow-up is suggested. 3. Cardiomegaly. 4. Small bilateral pleural effusions.   Electronically Signed   By: Rosemarie Ax   On: 03/29/2014 11:21   Dg Chest Port 1 View  03/30/2014   CLINICAL DATA:  Pneumonia  EXAM: PORTABLE CHEST - 1 VIEW  COMPARISON:  04/08/2014  FINDINGS: Bilateral diffuse heterogeneous airspace disease shows no significant change since previous study. Heart size is within normal limits. Prior median sternotomy again noted. Patient remains partially rotated to the right.  IMPRESSION: Diffuse bilateral airspace disease, without significant change.   Electronically Signed   By: Earle Gell M.D.   On: 03/30/2014 08:02     ASSESSMENT / PLAN:  PULMONARY A: Hypoxic respiratory failure with bilateral patchy infiltrate -- HCAP certain possibility but ?other etiology ie ILD v malignancy.   HCAP  P:   Cont abx as below, see IDE   Diuresis as tol , goal 1.5 liters neg BD's  F/u CXR  DNR/DNI established Supplemental O2 as needed  Autoimmune w/u pending  Would not tol FOB, DNI, would possible harm /  demise Has been on/off bipap x 4 days - will need to make some decisions if no improvement in next 24 hours and consider further comfort/ d/c bipap  abg reviewed, reduce volumes / MV Given clinically declines, DNI, would favor empiric steroids for now, not large dose pulse at this stage  CARDIOVASCULAR A: History of CAD Pulmonary edema, favor 2/2 decompensated systolic dysfunction. P:   Cont aggressive diuresis as BP/ scr tolerate  Pressors would not change outcome  RENAL A: No acute issues P:   F/u chem  Continued lasix  GASTROINTESTINAL A: Gastritis History of colon CA Weight loss Malnutrition P:   Chronic issues PPI BID  HEMATOLOGIC A: History of Colon CA Iron def anemia P:   H&H stable; no acute issues  INFECTIOUS A: Possible HCAP P:   BCx2 12/9>> Urine 12/9>>> Vancomycin 12/11>>> levaquin 12/11>>> Cefepime 12/10>>> Afebrile for several days, WBC trending down. Continue vanc/cefepime/levofloxacin to complete 10 days. Consider pct algo to limit abx exposure  ENDOCRINE A: Hypothryoid P:   Continue Synthroid   NEUROLOGIC  A: Seizure d/o Prior stroke Expressive aphasia P:   Had previously been anticoagulated (hx AF w/ high CHADS2VASC score). Holding given acute illness; could consider restarting.  Nickolas Madrid, NP 03/30/2014  11:50 AM Pager: [phone] or [phone]  STAFF NOTE: I, Merrie Roof, MD FACP have personally reviewed patient's available data, including medical history, events of note, physical examination and test results as part of my evaluation. I have discussed with resident/NP and other care providers such as pharmacist, RN and RRT. In addition, I personally evaluated patient and elicited key findings of: BIPAP, some distress, need some interuption NIMV 30 min every 34 hrs, consider empiric steroids in this circumstances, lasix maintain, pct, follow auitoimmune work uop The patient is critically ill with multiple organ  systems failure and requires high complexity decision making for assessment and support, frequent evaluation and titration of therapies, application of advanced monitoring technologies and extensive interpretation of multiple databases.   Critical Care Time devoted to patient care services described in this note is30 Minutes. This time reflects time of care of this signee: Merrie Roof, MD FACP. This critical care time does not reflect procedure time, or teaching time or supervisory time of PA/NP/Med student/Med Resident etc but could involve care discussion time. Rest per NP/medical resident whose note is outlined above and that I agree with   Lavon Paganini. Titus Mould, MD, [place] Pgr: [place] [facility] 03/30/2014 2:47 PM

## 2014-03-31 ENCOUNTER — Inpatient Hospital Stay (HOSPITAL_COMMUNITY): Payer: Medicare Other

## 2014-03-31 LAB — GLUCOSE, CAPILLARY
GLUCOSE-CAPILLARY: 175 mg/dL — AB (ref 70–99)
GLUCOSE-CAPILLARY: 172 mg/dL — AB (ref 70–99)
GLUCOSE-CAPILLARY: 186 mg/dL — AB (ref 70–99)
GLUCOSE-CAPILLARY: 169 mg/dL — AB (ref 70–99)
Glucose-Capillary: 183 mg/dL — ABNORMAL HIGH (ref 70–99)
Glucose-Capillary: 197 mg/dL — ABNORMAL HIGH (ref 70–99)

## 2014-03-31 LAB — CULTURE, RESPIRATORY W GRAM STAIN: Culture: NO GROWTH

## 2014-03-31 LAB — CBC
HCT: 42.7 % (ref 39.0–52.0)
HEMOGLOBIN: 14.2 g/dL (ref 13.0–17.0)
MCH: 29.6 pg (ref 26.0–34.0)
MCHC: 33.3 g/dL (ref 30.0–36.0)
MCV: 89 fL (ref 78.0–100.0)
PLATELETS: 231 10*3/uL (ref 150–400)
RBC: 4.8 MIL/uL (ref 4.22–5.81)
RDW: 15.9 % — ABNORMAL HIGH (ref 11.5–15.5)
WBC: 8.8 10*3/uL (ref 4.0–10.5)

## 2014-03-31 LAB — CULTURE, RESPIRATORY

## 2014-03-31 LAB — PROCALCITONIN: Procalcitonin: 1.88 ng/mL

## 2014-03-31 MED ORDER — FUROSEMIDE 10 MG/ML IJ SOLN
40.0000 mg | Freq: Two times a day (BID) | INTRAMUSCULAR | Status: AC
  Administered 2014-03-31 – 2014-04-01 (×2): 40 mg via INTRAVENOUS
  Filled 2014-03-31 (×2): qty 4

## 2014-03-31 MED ORDER — POTASSIUM CHLORIDE 10 MEQ/100ML IV SOLN
10.0000 meq | INTRAVENOUS | Status: AC
  Administered 2014-03-31 (×2): 10 meq via INTRAVENOUS
  Filled 2014-03-31: qty 100

## 2014-03-31 MED ORDER — VITAL HIGH PROTEIN PO LIQD
1000.0000 mL | ORAL | Status: DC
  Filled 2014-03-31 (×2): qty 1000

## 2014-03-31 NOTE — Progress Notes (Signed)
PULMONARY / CRITICAL CARE MEDICINE    Name: [name] MRN: [id number] DOB: 1945/11/15    ADMISSION DATE:  04/04/2014  PRIMARY SERVICE: PCCM  CHIEF COMPLAINT:  Dyspnea  BRIEF PATIENT DESCRIPTION: 68 y/o man with poor functional status and overall progressive decline presented to [facility] 12/9 with dyspnea and hypoxia,  Admitted and treated for HCAP.  Progressive respiratory failure concerning for ARDS.  Tx 12/11 to [facility] for PCCM mgmt and ? FOB.    Of note, the patient has a brother with recently diagnosed ILD (per sister, is presumed IPF and was started on either pirfenidone and nintedanib).  SIGNIFICANT EVENTS / STUDIES:  12/11>>Transferred from [facility], stable on BiPAP. 12/11>> family meeting, DNR/DNI 12/12-worsening resp status  SUBJECTIVE:  Trial off bipap earlier, did not tol despite NRB.  Febrile. Neg 3 liters  VITAL SIGNS: Temp:  [97.7 F (36.5 C)-101.3 F (38.5 C)] 101.2 F (38.4 C) (12/13 1000) Pulse Rate:  [60-81] 67 (12/13 1042) Resp:  [16-38] 31 (12/13 1042) BP: (134-168)/(62-96) 151/68 mmHg (12/13 1042) SpO2:  [88 %-96 %] 91 % (12/13 1042) FiO2 (%):  [60 %-70 %] 60 % (12/13 0400) HEMODYNAMICS:   VENTILATOR SETTINGS: Vent Mode:  [-]  FiO2 (%):  [60 %-70 %] 60 % INTAKE / OUTPUT: Intake/Output      12/12 0701 - 12/13 0700 12/13 0701 - 12/14 0700   I.V. (mL/kg) 240 (2.8) 30 (0.4)   IV Piggyback 565    Total Intake(mL/kg) 805 (9.5) 30 (0.4)   Urine (mL/kg/hr) 3700 (1.8) 650 (1.7)   Total Output 3700 650   Net -2895 -620          PHYSICAL EXAMINATION: General:  Chronically ill appearing male, NAD on bipap Neuro:  MAE, gen weakness, difficult to assess with bipap mask, tries to talk  HEENT: MMM Neck: No extensive JVD Cardiovascular:  No M/R/G Lungs:  Few scattered ronchi, diminished bases, no audible wheeze   Abdomen:  Obese Musculoskeletal:  No deformities Skin:  Several scars noted on UE.  LABS:  CBC  Recent Labs Lab 03/28/14 0429  03/29/14 0432 03/31/14 0240  WBC 33.8* 16.7* 8.8  HGB 12.2* 12.6* 14.2  HCT 37.8* 37.9* 42.7  PLT 150 143* 231   Coag's No results for input(s): APTT, INR in the last 168 hours. BMET  Recent Labs Lab 03/28/14 0429 03/29/14 0432 03/30/14 1334  NA 142 139 136*  K 4.6 4.2 3.8  CL 105 103 97  CO2 24 26 24   BUN 31* 25* 22  CREATININE 0.82 0.64 0.58  GLUCOSE 171* 82 144*   Electrolytes  Recent Labs Lab 03/28/14 0429 03/29/14 0432 03/30/14 1334  CALCIUM 8.1* 8.3* 8.9   Sepsis Markers  Recent Labs Lab 04/06/2014 1825 04/06/2014 2218 03/28/14 1052 03/30/14 1334 03/31/14 0240  LATICACIDVEN 4.35* 4.29* 1.5  --   --   PROCALCITON  --   --   --  2.88 1.88   ABG  Recent Labs Lab 03/29/14 0513 03/29/14 2036 03/30/14 0306  PHART 7.437 7.435 7.538*  PCO2ART 38.6 34.8* 26.7*  PO2ART 56.7* 62.0* 87.0   Liver Enzymes  Recent Labs Lab 04/08/2014 1935 03/28/14 0429  AST 21 33  ALT 5 <5  ALKPHOS 39 35*  BILITOT 0.5 0.4  ALBUMIN 2.3* 2.0*   Cardiac Enzymes  Recent Labs Lab 04/18/2014 1935  TROPONINI <0.30  PROBNP 2710.0*   Glucose  Recent Labs Lab 03/30/14 1128 03/30/14 1541 03/30/14 1959 03/30/14 2335 03/31/14 0404 03/31/14 0731  GLUCAP 141* 143* 170* 197* 175* 183*    Imaging Dg Chest Portable 1 View  03/31/2014   CLINICAL DATA:  Pneumonia, diabetes  EXAM: PORTABLE CHEST - 1 VIEW  COMPARISON:  the previous day's study  FINDINGS: Previous median sternotomy and CABG. Heart size normal. Coarse predominantly perihilar and bibasilar airspace disease left greater than right, not significantly changed from previous day's exam. No definite effusion  IMPRESSION: 1. Little change in asymmetric infiltrates/edema   Electronically Signed   By: Arne Cleveland M.D.   On: 03/31/2014 08:34   Dg Chest Port 1 View  03/30/2014   CLINICAL DATA:  Pneumonia  EXAM: PORTABLE CHEST - 1 VIEW  COMPARISON:  03/26/2014  FINDINGS: Bilateral diffuse heterogeneous airspace disease  shows no significant change since previous study. Heart size is within normal limits. Prior median sternotomy again noted. Patient remains partially rotated to the right.  IMPRESSION: Diffuse bilateral airspace disease, without significant change.   Electronically Signed   By: Earle Gell M.D.   On: 03/30/2014 08:02     ASSESSMENT / PLAN:  PULMONARY A: Hypoxic respiratory failure with bilateral patchy infiltrate -- HCAP certain possibility but ?other etiology ie ILD v malignancy.   HCAP - pct not impressive  P:   Cont abx as below, see ID Cont gentle diuresis as tol - daily assessment, continued to neg balance as last 24 hrs BD's  F/u CXR  DNR/DNI established Cont bipap for now - see discussion below  Autoimmune w/u pending , may need to re order? Cont empiric steroids for now (not large dose pulse at this stage) but no sig improvement with this yet   CARDIOVASCULAR A: History of CAD Pulmonary edema, favor 2/2 decompensated systolic dysfunction. P:   Cont aggressive diuresis as BP/ scr tolerate  Pressors would not change outcome  RENAL A: Hyponatremia - mild  P:   F/u chem  Continued lasix Small K supp  GASTROINTESTINAL A: Gastritis History of colon CA Weight loss Malnutrition P:   Chronic issues PPI BID Consider NGT and feeding on NIMV, strict asp precautions  HEMATOLOGIC A: History of Colon CA Iron def anemia P:   H&H stable; no acute issues dvt prevention  INFECTIOUS A: Possible HCAP - pct not impressive  P:   BCx2 12/9>> Urine 12/9>>>neg Vancomycin 12/11>>>12/13 levaquin 12/11>>> Cefepime 12/10>>>12/13  Now febrile but WBC trending down  Cultures neg to date  D/c vanc, Narrow abx to levaquin alone for now with neg cultures, low pct, wbc wnl   ENDOCRINE A: Hypothryoid P:   Continue Synthroid   NEUROLOGIC A: Seizure d/o Prior stroke Expressive aphasia P:   Had previously been anticoagulated (hx AF w/ high CHADS2VASC score). Holding  given acute illness; could consider restarting.   Previous discussions with family noted.  Pt DNR/DNI.  Does not seem to be improving, not able to wean off bipap.  Need to discuss further goals of care, possible transition to comfort and d/c bipap.    Nickolas Madrid, NP 03/31/2014  11:25 AM Pager: [phone] or [phone]  STAFF NOTE: I, Merrie Roof, MD FACP have personally reviewed patient's available data, including medical history, events of note, physical examination and test results as part of my evaluation. I have discussed with resident/NP and other care providers such as pharmacist, RN and RRT. In addition, I personally evaluated patient and elicited key findings of: still dep on NIMV, need some interutions, steroids maintain, await autoimmune work up, narrowe off vanc, keep atypical levofloxacin,  lasix to continue, k supp, coarse still on exam and some distress, dni  The patient is critically ill with multiple organ systems failure and requires high complexity decision making for assessment and support, frequent evaluation and titration of therapies, application of advanced monitoring technologies and extensive interpretation of multiple databases.   Critical Care Time devoted to patient care services described in this note is 30 minutes. This time reflects time of care of this signee: Merrie Roof, MD FACP. This critical care time does not reflect procedure time, or teaching time or supervisory time of PA/NP/Med student/Med Resident etc but could involve care discussion time. Rest per NP/medical resident whose note is outlined above and that I agree with   Lavon Paganini. Titus Mould, MD, [place] Pgr: [place] [facility] 03/31/2014 12:00 PM   Lavon Paganini. Titus Mould, MD, [place] Pgr: [place] [facility] 03/31/2014 11:51 AM

## 2014-03-31 NOTE — Progress Notes (Signed)
Removed patient from bipap and placed on 100%NRB. Patient tolerating well at this time. No complications. Vital signs stable at this time. RN aware. RT will continue to monitor.

## 2014-04-01 ENCOUNTER — Inpatient Hospital Stay (HOSPITAL_COMMUNITY): Payer: Medicare Other

## 2014-04-01 DIAGNOSIS — J80 Acute respiratory distress syndrome: Secondary | ICD-10-CM

## 2014-04-01 LAB — RESPIRATORY VIRUS PANEL
Adenovirus: NOT DETECTED
Influenza A H1: NOT DETECTED
Influenza A H3: NOT DETECTED
Influenza A: NOT DETECTED
Influenza B: NOT DETECTED
Metapneumovirus: NOT DETECTED
Parainfluenza 1: NOT DETECTED
Parainfluenza 2: NOT DETECTED
Parainfluenza 3: NOT DETECTED
Respiratory Syncytial Virus A: NOT DETECTED
Respiratory Syncytial Virus B: NOT DETECTED
Rhinovirus: NOT DETECTED

## 2014-04-01 LAB — CBC
HCT: 46 % (ref 39.0–52.0)
Hemoglobin: 15.9 g/dL (ref 13.0–17.0)
MCH: 31.3 pg (ref 26.0–34.0)
MCHC: 34.6 g/dL (ref 30.0–36.0)
MCV: 90.6 fL (ref 78.0–100.0)
Platelets: 308 10*3/uL (ref 150–400)
RBC: 5.08 MIL/uL (ref 4.22–5.81)
RDW: 15.9 % — AB (ref 11.5–15.5)
WBC: 9.6 10*3/uL (ref 4.0–10.5)

## 2014-04-01 LAB — BASIC METABOLIC PANEL
ANION GAP: 19 — AB (ref 5–15)
BUN: 35 mg/dL — ABNORMAL HIGH (ref 6–23)
CALCIUM: 9.5 mg/dL (ref 8.4–10.5)
CO2: 25 meq/L (ref 19–32)
CREATININE: 0.71 mg/dL (ref 0.50–1.35)
Chloride: 96 mEq/L (ref 96–112)
GFR calc Af Amer: >90 mL/min (ref >90)
GFR calc non Af Amer: >90 mL/min (ref >90)
Glucose, Bld: 183 mg/dL — ABNORMAL HIGH (ref 70–99)
Potassium: 3.4 mEq/L — ABNORMAL LOW (ref 3.7–5.3)
Sodium: 140 mEq/L (ref 137–147)

## 2014-04-01 LAB — CULTURE, BLOOD (ROUTINE X 2)
Culture: NO GROWTH
Culture: NO GROWTH

## 2014-04-01 LAB — GLUCOSE, CAPILLARY
GLUCOSE-CAPILLARY: 149 mg/dL — AB (ref 70–99)
GLUCOSE-CAPILLARY: 207 mg/dL — AB (ref 70–99)
GLUCOSE-CAPILLARY: 212 mg/dL — AB (ref 70–99)
Glucose-Capillary: 178 mg/dL — ABNORMAL HIGH (ref 70–99)
Glucose-Capillary: 175 mg/dL — ABNORMAL HIGH (ref 70–99)
Glucose-Capillary: 192 mg/dL — ABNORMAL HIGH (ref 70–99)

## 2014-04-01 LAB — SEDIMENTATION RATE: Sed Rate: 10 mm/hr (ref 0–16)

## 2014-04-01 LAB — PROCALCITONIN: Procalcitonin: 1.03 ng/mL

## 2014-04-01 LAB — RHEUMATOID FACTOR: Rhuematoid fact SerPl-aCnc: <10 IU/mL (ref <=14)

## 2014-04-01 LAB — C-REACTIVE PROTEIN: CRP: 1.7 mg/dL — ABNORMAL HIGH (ref <0.60)

## 2014-04-01 MED ORDER — FUROSEMIDE 10 MG/ML IJ SOLN
20.0000 mg | Freq: Every day | INTRAMUSCULAR | Status: DC
  Administered 2014-04-01 – 2014-04-02 (×2): 20 mg via INTRAVENOUS
  Filled 2014-04-01 (×2): qty 2

## 2014-04-01 MED ORDER — POTASSIUM CHLORIDE 10 MEQ/100ML IV SOLN
10.0000 meq | INTRAVENOUS | Status: AC
  Administered 2014-04-01 (×3): 10 meq via INTRAVENOUS
  Filled 2014-04-01 (×3): qty 100

## 2014-04-01 NOTE — Progress Notes (Signed)
SLP Cancellation Note  Patient Details Name: [name] MRN: [id number] DOB: 05/02/1945   Cancelled treatment:       Reason Eval/Treat Not Completed: Medical issues which prohibited therapy (Patient on BiPap)  [name], CCC-SLP [phone]  Justin Rainwater Meryl 04/01/2014, 10:11 AM

## 2014-04-01 NOTE — Progress Notes (Signed)
INITIAL NUTRITION ASSESSMENT  DOCUMENTATION CODES Per approved criteria  -Non-severe (moderate) malnutrition in the context of chronic illness   Pt meets criteria for moderate MALNUTRITION in the context of chronic illness as evidenced by mild-moderate depletion of muscle mass and 10% weight loss within 3 months.  INTERVENTION:  Diet advancement as able pending improvement in respiratory status, recommend regular diet with Ensure Complete PO BID, each supplement provides 350 kcal and 13 grams of protein.  If unable to advance diet and feeding tube is placed, recommend Vital AF 1.2 at 70 ml/h to provide 2016 kcals, 126 gm protein, 1362 ml free water daily.  NUTRITION DIAGNOSIS: Inadequate oral intake related to inability to eat due to respiratory distress as evidenced by NPO status.   Goal: Intake to meet >90% of estimated nutrition needs.  Monitor:  Diet advancement vs initiation of TF, weight trend, labs, respiratory status.  Reason for Assessment: MD Consult for TF initiation and management.  68 y.o. male  Admitting Dx: Sepsis  ASSESSMENT: 68 y/o man with poor functional status and overall progressive decline presented to [facility] 12/9 with dyspnea and hypoxia,admitted and treated for HCAP. Progressive respiratory failure concerning for ARDS. Transferred on 12/11 to [facility] for further management.  Patient currently receiving BiPAP, unable to provide much nutrition history, agrees that he has lost weight. Remains NPO at this time. Discussed patient in ICU rounds today. Plans to place a feeding tube on hold per POA request. Depending on respiratory status, may receive NGT placement for TF administration.   Nutrition Focused Physical Exam:  Subcutaneous Fat:  Orbital Region: WNL Upper Arm Region: WNL Thoracic and Lumbar Region: NA  Muscle:  Temple Region: mild depletion Clavicle Bone Region: moderate depletion Clavicle and Acromion Bone Region: moderate  depletion Scapular Bone Region: NA Dorsal Hand: NA Patellar Region: mild depletion Anterior Thigh Region: mild depletion Posterior Calf Region: mild depletion  Edema: none   Height: Ht Readings from Last 1 Encounters:  03/29/14 5\' 9"  (1.753 m)    Weight: Wt Readings from Last 1 Encounters:  04/01/14 173 lb 1 oz (78.5 kg)    Ideal Body Weight: 72.7 kg  % Ideal Body Weight: 108%  Wt Readings from Last 10 Encounters:  04/01/14 173 lb 1 oz (78.5 kg)  03/22/14 181 lb 3.5 oz (82.2 kg)  02/13/14 193 lb (87.544 kg)  07/27/13 208 lb 6.4 oz (94.53 kg)  01/10/13 224 lb (101.606 kg)  12/12/12 233 lb (105.688 kg)  07/18/12 230 lb 14.4 oz (104.736 kg)  06/30/12 235 lb (106.595 kg)  02/25/12 239 lb 12.8 oz (108.773 kg)  01/21/12 244 lb (110.678 kg)    Usual Body Weight: 208 lb (8 months ago); 193 lb (2 months ago)  % Usual Body Weight: 83-90%  BMI:  Body mass index is 25.54 kg/(m^2).  Estimated Nutritional Needs: Kcal: 2000-2200 Protein: 110-125 gm Fluid: 2-2.2 L  Skin: intact  Diet Order: Diet NPO time specified  EDUCATION NEEDS: -Education not appropriate at this time   Intake/Output Summary (Last 24 hours) at 04/01/14 1023 Last data filed at 04/01/14 1000  Gross per 24 hour  Intake    305 ml  Output   2460 ml  Net  -2155 ml    Last BM: 12/18   Labs:   Recent Labs Lab 03/29/14 0432 03/30/14 1334 04/01/14 0245  NA 139 136* 140  K 4.2 3.8 3.4*  CL 103 97 96  CO2 26 24 25   BUN 25* 22 35*  CREATININE  0.64 0.58 0.71  CALCIUM 8.3* 8.9 9.5  GLUCOSE 82 144* 183*    CBG (last 3)   Recent Labs  03/31/14 1927 03/31/14 2359 04/01/14 0356  GLUCAP 169* 207* 178*    Scheduled Meds: . antiseptic oral rinse  7 mL Mouth Rinse q12n4p  . chlorhexidine  15 mL Mouth Rinse BID  . enoxaparin (LOVENOX) injection  40 mg Subcutaneous Daily  . insulin aspart  0-9 Units Subcutaneous 6 times per day  . levofloxacin (LEVAQUIN) IV  750 mg Intravenous Q24H  .  levothyroxine  75 mcg Intravenous QAC breakfast  . methylPREDNISolone (SOLU-MEDROL) injection  60 mg Intravenous Q6H  . metoprolol  5 mg Intravenous 4 times per day  . pantoprazole (PROTONIX) IV  40 mg Intravenous Q12H  . sodium chloride  3 mL Intravenous Q12H  . valproate sodium  500 mg Intravenous 3 times per day    Continuous Infusions: . sodium chloride 10 mL/hr at 03/31/14 1000    Past Medical History  Diagnosis Date  . PVD (peripheral vascular disease)     Severe lower extremitiy  . Type 2 diabetes mellitus   . Mixed hyperlipidemia   . Obesity   . Seizure disorder   . OSA (obstructive sleep apnea)   . CVA (cerebrovascular accident) 2004    Persistent residual expressive aphasia  . Coronary atherosclerosis of native coronary artery     Multivessel  . Chest discomfort     Chronic on narcotics - Dr. Woody Seller  . Cervical spine disease   . Iron deficiency anemia     Managed by Dr. Marin Olp  . PAF (paroxysmal atrial fibrillation)   . Carotid artery disease   . GERD (gastroesophageal reflux disease)   . Myocardial infarction   . Colon cancer 2007    Past Surgical History  Procedure Laterality Date  . Carotid endarterectomy      Left  . Partial colectomy      2007  . Coronary artery bypass graft  05/2006    LIMA to diagonal, RIMA to LAD, radial to OM, radial to PDA  . Left shoulder surgery    . Cholecystectomy    . Femoral artery - popliteal artery bypass graft      Bilateral  . Tonsillectomy and adenoidectomy    . Esophagogastroduodenoscopy N/A 03/22/2014    Procedure: ESOPHAGOGASTRODUODENOSCOPY (EGD) Esophageal dilation;  Surgeon: Rogene Houston, MD;  Location: [facility];  Service: Endoscopy;  Laterality: N/A;  Venia Minks dilation N/A 03/22/2014    Procedure: Venia Minks DILATION;  Surgeon: Rogene Houston, MD;  Location: [facility];  Service: Endoscopy;  Laterality: N/A;    Molli Barrows, RD, LDN, [place] Pager [phone] After Hours Pager [phone]

## 2014-04-01 NOTE — Progress Notes (Signed)
PULMONARY / CRITICAL CARE MEDICINE    Name: [name] MRN: [id number] DOB: 01/22/1946    ADMISSION DATE:  03/20/2014  PRIMARY SERVICE: PCCM  CHIEF COMPLAINT:  Dyspnea  BRIEF PATIENT DESCRIPTION: 68 y/o man with poor functional status and overall progressive decline presented to [facility] 12/9 with dyspnea and hypoxia,  Admitted and treated for HCAP.  Progressive respiratory failure concerning for ARDS.  Tx 12/11 to [facility] for PCCM mgmt and ? FOB.    Of note, the patient has a brother with recently diagnosed ILD (per sister, is presumed IPF and was started on either pirfenidone and nintedanib).  SIGNIFICANT EVENTS / STUDIES:  12/11>>Transferred from [facility], stable on BiPAP. 12/11>> family meeting, DNR/DNI 12/12-worsening resp status 12/13- refused NGT  SUBJECTIVE:  Continued nimv required  VITAL SIGNS: Temp:  [99.5 F (37.5 C)-100.3 F (37.9 C)] 99.7 F (37.6 C) (12/14 0900) Pulse Rate:  [55-82] 69 (12/14 0900) Resp:  [16-31] 20 (12/14 0900) BP: (119-155)/(58-108) 140/65 mmHg (12/14 0900) SpO2:  [93 %-99 %] 99 % (12/14 0900) FiO2 (%):  [80 %] 80 % (12/14 0835) Weight:  [78.5 kg (173 lb 1 oz)] 78.5 kg (173 lb 1 oz) (12/14 0500) HEMODYNAMICS:   VENTILATOR SETTINGS: Vent Mode:  [-]  FiO2 (%):  [80 %] 80 % INTAKE / OUTPUT: Intake/Output      12/13 0701 - 12/14 0700 12/14 0701 - 12/15 0700   I.V. (mL/kg) 150 (1.9) 20 (0.3)   IV Piggyback 165    Total Intake(mL/kg) 315 (4) 20 (0.3)   Urine (mL/kg/hr) 3065 (1.6) 45 (0.1)   Total Output 3065 45   Net -2750 -25          PHYSICAL EXAMINATION: General:  Chronically ill appearing male, no distress, no increase wob Neuro: awake, oriented, nonfocal HEENT: jvd low Cardiovascular:  No M/R/G Lungs:  ronchi unchanged  Abdomen:  Obese Musculoskeletal:  No deformities Skin:  Several scars noted on UE.  LABS:  CBC  Recent Labs Lab 03/29/14 0432 03/31/14 0240 04/01/14 0245  WBC 16.7* 8.8 9.6  HGB 12.6* 14.2 15.9   HCT 37.9* 42.7 46.0  PLT 143* 231 308   Coag's No results for input(s): APTT, INR in the last 168 hours. BMET  Recent Labs Lab 03/29/14 0432 03/30/14 1334 04/01/14 0245  NA 139 136* 140  K 4.2 3.8 3.4*  CL 103 97 96  CO2 26 24 25   BUN 25* 22 35*  CREATININE 0.64 0.58 0.71  GLUCOSE 82 144* 183*   Electrolytes  Recent Labs Lab 03/29/14 0432 03/30/14 1334 04/01/14 0245  CALCIUM 8.3* 8.9 9.5   Sepsis Markers  Recent Labs Lab 03/26/2014 1825 03/28/2014 2218 03/28/14 1052 03/30/14 1334 03/31/14 0240 04/01/14 0245  LATICACIDVEN 4.35* 4.29* 1.5  --   --   --   PROCALCITON  --   --   --  2.88 1.88 1.03   ABG  Recent Labs Lab 03/29/14 0513 03/29/14 2036 03/30/14 0306  PHART 7.437 7.435 7.538*  PCO2ART 38.6 34.8* 26.7*  PO2ART 56.7* 62.0* 87.0   Liver Enzymes  Recent Labs Lab 03/29/2014 1935 03/28/14 0429  AST 21 33  ALT 5 <5  ALKPHOS 39 35*  BILITOT 0.5 0.4  ALBUMIN 2.3* 2.0*   Cardiac Enzymes  Recent Labs Lab 04/12/2014 1935  TROPONINI <0.30  PROBNP 2710.0*   Glucose  Recent Labs Lab 03/31/14 1533 03/31/14 1927 03/31/14 2359 04/01/14 0356 04/01/14 0832 04/01/14 1140  GLUCAP 186* 169* 207* 178* 175* 212*  Imaging Dg Chest Port 1 View  04/01/2014   CLINICAL DATA:  Respiratory failure  EXAM: PORTABLE CHEST - 1 VIEW  COMPARISON:  03/31/2014  FINDINGS: Normal heart size. Unchanged aortic tortuosity. CABG changes again noted. Bilateral airspace disease has gradually improved since 04/12/2014. Residual opacities are mainly at the bases, especially on the left. There is chronic elevation of the right diaphragm. No effusion or pneumothorax.  IMPRESSION: Clearing bilateral airspace disease, with residual consolidation mainly in the left lower lobe.   Electronically Signed   By: Jorje Guild M.D.   On: 04/01/2014 06:07   Dg Chest Portable 1 View  03/31/2014   CLINICAL DATA:  Pneumonia, diabetes  EXAM: PORTABLE CHEST - 1 VIEW  COMPARISON:  the  previous day's study  FINDINGS: Previous median sternotomy and CABG. Heart size normal. Coarse predominantly perihilar and bibasilar airspace disease left greater than right, not significantly changed from previous day's exam. No definite effusion  IMPRESSION: 1. Little change in asymmetric infiltrates/edema   Electronically Signed   By: Arne Cleveland M.D.   On: 03/31/2014 08:34     ASSESSMENT / PLAN:  PULMONARY A: Hypoxic respiratory failure with bilateral patchy infiltrate -- HCAP certain possibility but ?other etiology ie ILD v malignancy.   HCAP - pct not impressive  P:   Need to continued diuresis, but reduce BD's  F/u CXR as improved last 24 hr with neg 2.6 liters DNR/DNI Will have a discussion with med poa on duration can use NIMV  Attempt cycle off 30 min with [place] and 100% NRB Autoimmune  -  Cont empiric steroids some drop in fio2, remain  CARDIOVASCULAR A: History of CAD Pulmonary edema, favor 2/2 decompensated systolic dysfunction. P:   Cont aggressive diuresis as BP/ scr tolerate  Pressors would not change outcome Reduce lasix  RENAL A: Hyponatremia - mild  hypoK P:   F/u chem  Continued lasix but reduce to 20 qday Small K supp  GASTROINTESTINAL A: Gastritis History of colon CA Weight loss Malnutrition P:   Chronic issues PPI BID Consider NGT and feeding will attempt again today to convince him of this NO ROLE TPN  HEMATOLOGIC A: History of Colon CA Iron def anemia P:   H&H stable; no acute issues dvt prevention  INFECTIOUS A: Possible HCAP - pct not impressive  P:   BCx2 12/9>> Urine 12/9>>>neg Vancomycin 12/11>>>12/13 levaquin 12/11>>> Cefepime 12/10>>>12/13  Adds top date levofloxacin, add total 8 days   ENDOCRINE A: Hypothryoid P:   Continue Synthroid   NEUROLOGIC A: Seizure d/o Prior stroke Expressive aphasia P:   Had previously been anticoagulated (hx AF w/ high CHADS2VASC score). Holding still  NCB  Global:  NIMV, attempt interruption further, may be limited soon to continued NIMV, lasix reduction, k supp, steriods, ensure autop immune sent  Ccm time 30 min  Lavon Paganini. Titus Mould, MD, [place] Pgr: [facility] 04/01/2014 12:03 PM

## 2014-04-01 NOTE — Progress Notes (Signed)
Per MD order RT took pt off of BiPAP and placed on NRB for 30 min trial. Pt is tolerating well at this time, RT will continue to monitor.

## 2014-04-01 NOTE — Progress Notes (Signed)
UR Completed.  [phone]  

## 2014-04-01 NOTE — Clinical Social Work Note (Signed)
Clinical Social Worker contacted pt's sister, Con Memos (pt's HCPOA) and spoke at length via telephone. CSW has assessed pt and pt's family. Full psychosocial assessment to follow.   CSW to follow pt and pt's family for continued support and for disposition needs.   Glendon Axe, MSW, LCSWA [phone] 04/01/2014 4:44 PM

## 2014-04-01 NOTE — Progress Notes (Signed)
OK to discontinue droplet precautions per Dr. Elsworth Soho.

## 2014-04-02 ENCOUNTER — Inpatient Hospital Stay (HOSPITAL_COMMUNITY): Payer: Medicare Other

## 2014-04-02 LAB — GLUCOSE, CAPILLARY
GLUCOSE-CAPILLARY: 181 mg/dL — AB (ref 70–99)
Glucose-Capillary: 178 mg/dL — ABNORMAL HIGH (ref 70–99)
Glucose-Capillary: 201 mg/dL — ABNORMAL HIGH (ref 70–99)
Glucose-Capillary: 189 mg/dL — ABNORMAL HIGH (ref 70–99)
Glucose-Capillary: 188 mg/dL — ABNORMAL HIGH (ref 70–99)
Glucose-Capillary: 152 mg/dL — ABNORMAL HIGH (ref 70–99)

## 2014-04-02 LAB — BASIC METABOLIC PANEL
Anion gap: 17 — ABNORMAL HIGH (ref 5–15)
BUN: 45 mg/dL — AB (ref 6–23)
CO2: 25 mEq/L (ref 19–32)
CREATININE: 0.68 mg/dL (ref 0.50–1.35)
Calcium: 9.5 mg/dL (ref 8.4–10.5)
Chloride: 99 mEq/L (ref 96–112)
GFR calc Af Amer: >90 mL/min (ref >90)
GLUCOSE: 180 mg/dL — AB (ref 70–99)
Potassium: 3.8 mEq/L (ref 3.7–5.3)
Sodium: 141 mEq/L (ref 137–147)

## 2014-04-02 LAB — CBC WITH DIFFERENTIAL/PLATELET
Basophils Absolute: 0 10*3/uL (ref 0.0–0.1)
Basophils Relative: 0 % (ref 0–1)
EOS ABS: 0 10*3/uL (ref 0.0–0.7)
Eosinophils Relative: 0 % (ref 0–5)
HCT: 45.7 % (ref 39.0–52.0)
Hemoglobin: 15.3 g/dL (ref 13.0–17.0)
Lymphocytes Relative: 17 % (ref 12–46)
Lymphs Abs: 1.5 10*3/uL (ref 0.7–4.0)
MCH: 30.3 pg (ref 26.0–34.0)
MCHC: 33.5 g/dL (ref 30.0–36.0)
MCV: 90.5 fL (ref 78.0–100.0)
Monocytes Absolute: 1.4 10*3/uL — ABNORMAL HIGH (ref 0.1–1.0)
Monocytes Relative: 15 % — ABNORMAL HIGH (ref 3–12)
Neutro Abs: 6.3 10*3/uL (ref 1.7–7.7)
Neutrophils Relative %: 68 % (ref 43–77)
PLATELETS: 344 10*3/uL (ref 150–400)
RBC: 5.05 MIL/uL (ref 4.22–5.81)
RDW: 16.3 % — ABNORMAL HIGH (ref 11.5–15.5)
WBC: 9.3 10*3/uL (ref 4.0–10.5)

## 2014-04-02 LAB — ANA: Anti Nuclear Antibody(ANA): NEGATIVE

## 2014-04-02 LAB — C4 COMPLEMENT: COMPLEMENT C4, BODY FLUID: 25 mg/dL (ref 10–40)

## 2014-04-02 LAB — C3 COMPLEMENT: C3 Complement: 125 mg/dL (ref 90–180)

## 2014-04-02 MED ORDER — CHLORHEXIDINE GLUCONATE CLOTH 2 % EX PADS
6.0000 | MEDICATED_PAD | Freq: Every day | CUTANEOUS | Status: DC
  Administered 2014-04-02 – 2014-04-04 (×3): 6 via TOPICAL

## 2014-04-02 MED ORDER — METHYLPREDNISOLONE SODIUM SUCC 40 MG IJ SOLR
40.0000 mg | Freq: Two times a day (BID) | INTRAMUSCULAR | Status: DC
  Administered 2014-04-02 – 2014-04-04 (×4): 40 mg via INTRAVENOUS
  Filled 2014-04-02 (×5): qty 1

## 2014-04-02 MED ORDER — MUPIROCIN 2 % EX OINT
1.0000 "application " | TOPICAL_OINTMENT | Freq: Two times a day (BID) | CUTANEOUS | Status: DC
  Administered 2014-04-02 – 2014-04-04 (×5): 1 via NASAL
  Filled 2014-04-02: qty 22

## 2014-04-02 MED ORDER — METHYLPREDNISOLONE SODIUM SUCC 40 MG IJ SOLR: 40.0000 mg | Freq: Three times a day (TID) | INTRAMUSCULAR | Status: DC

## 2014-04-02 NOTE — Clinical Social Work Note (Signed)
Patient already has psychosocial assessment completed from 03/29/14 at [facility]. Clinical Social Worker spoke with pt's sister, [name] yesterday at length via telephone in reference to pt's return to [facility] once medically stable. Pt's sister stated pt enjoys [facility] and has been a resident for 3 years. Pt's sister also reported she is pleased with the care and would like him to return upon dc and facility confirmed.   CSW will continue to follow pt and pt's sister for continued support and to facilitate pt's dc needs once medically stable.   Glendon Axe, MSW, LCSWA [phone] 04/02/2014 11:09 AM

## 2014-04-02 NOTE — Progress Notes (Signed)
Pt just got to floor from 33M ICU. Pt is alert but not speaking much. Received report pt is confused. Pt is resting. Pt came to floor on a non re-breather on 15 liters of oxygen. Pt is on contact for MRSA. Placed pt's bed alarm on. Will monitor.

## 2014-04-02 NOTE — Progress Notes (Signed)
PULMONARY / CRITICAL CARE MEDICINE    Name: [name] MRN: [id number] DOB: 07/19/1945    ADMISSION DATE:  04/01/2014  PRIMARY SERVICE: PCCM  CHIEF COMPLAINT:  Dyspnea  BRIEF PATIENT DESCRIPTION: 68 y/o man with poor functional status and overall progressive decline presented to [facility] 12/9 with dyspnea and hypoxia,  Admitted and treated for HCAP.  Progressive respiratory failure concerning for ARDS.  Tx 12/11 to [facility] for PCCM mgmt and ? FOB.    Of note, the patient has a brother with recently diagnosed ILD (per sister, is presumed IPF and was started on either pirfenidone and nintedanib).  SIGNIFICANT EVENTS / STUDIES:  12/11>>Transferred from [facility], stable on BiPAP. 12/11>> family meeting, DNR/DNI 12/12-worsening resp status 12/13- refused NGT  SUBJECTIVE:  Able to interrupt NIMV longer today  VITAL SIGNS: Temp:  [98.2 F (36.8 C)-100.8 F (38.2 C)] 98.2 F (36.8 C) (12/15 1546) Pulse Rate:  [50-99] 63 (12/15 1546) Resp:  [14-31] 14 (12/15 1546) BP: (111-154)/(42-95) 152/72 mmHg (12/15 1546) SpO2:  [88 %-100 %] 93 % (12/15 1546) FiO2 (%):  [100 %] 100 % (12/15 0600) Weight:  [74.9 kg (165 lb 2 oz)] 74.9 kg (165 lb 2 oz) (12/15 0500) HEMODYNAMICS:   VENTILATOR SETTINGS: Vent Mode:  [-]  FiO2 (%):  [100 %] 100 % INTAKE / OUTPUT: Intake/Output      12/14 0701 - 12/15 0700 12/15 0701 - 12/16 0700   I.V. (mL/kg) 223 (3) 60 (0.8)   IV Piggyback 815 55   Total Intake(mL/kg) 1038 (13.9) 115 (1.5)   Urine (mL/kg/hr) 1100 (0.6) 550 (0.8)   Total Output 1100 550   Net -62 -435          PHYSICAL EXAMINATION: General:  Chronically ill appearing male, no distress, no increase wob on 100% Neuro: awake, oriented, nonfocal HEENT: jvd flat Cardiovascular:  s1 s2 No M/R/G Lungs:  ronchi clearing apical Abdomen:  Obese, nt, nd, no r /g Musculoskeletal:  No deformities Skin:  Several scars noted on UE.  LABS:  CBC  Recent Labs Lab 03/31/14 0240 04/01/14 0245  04/02/14 0227  WBC 8.8 9.6 9.3  HGB 14.2 15.9 15.3  HCT 42.7 46.0 45.7  PLT 231 308 344   Coag's No results for input(s): APTT, INR in the last 168 hours. BMET  Recent Labs Lab 03/30/14 1334 04/01/14 0245 04/02/14 0227  NA 136* 140 141  K 3.8 3.4* 3.8  CL 97 96 99  CO2 24 25 25   BUN 22 35* 45*  CREATININE 0.58 0.71 0.68  GLUCOSE 144* 183* 180*   Electrolytes  Recent Labs Lab 03/30/14 1334 04/01/14 0245 04/02/14 0227  CALCIUM 8.9 9.5 9.5   Sepsis Markers  Recent Labs Lab 04/14/2014 1825 03/24/2014 2218 03/28/14 1052 03/30/14 1334 03/31/14 0240 04/01/14 0245  LATICACIDVEN 4.35* 4.29* 1.5  --   --   --   PROCALCITON  --   --   --  2.88 1.88 1.03   ABG  Recent Labs Lab 03/29/14 0513 03/29/14 2036 03/30/14 0306  PHART 7.437 7.435 7.538*  PCO2ART 38.6 34.8* 26.7*  PO2ART 56.7* 62.0* 87.0   Liver Enzymes  Recent Labs Lab 04/12/2014 1935 03/28/14 0429  AST 21 33  ALT 5 <5  ALKPHOS 39 35*  BILITOT 0.5 0.4  ALBUMIN 2.3* 2.0*   Cardiac Enzymes  Recent Labs Lab 03/30/2014 1935  TROPONINI <0.30  PROBNP 2710.0*   Glucose  Recent Labs Lab 04/01/14 1140 04/01/14 1537 04/01/14 1957 04/02/14 0021  04/02/14 0428 04/02/14 0756  GLUCAP 212* 192* 149* 181* 178* 201*    Imaging Dg Chest Port 1 View  04/02/2014   CLINICAL DATA:  ARDS, history of obesity, diabetes and coronary artery disease  EXAM: PORTABLE CHEST - 1 VIEW  COMPARISON:  Portable chest x-ray of April 01, 2014 at 5:07 a.m.  FINDINGS: The right lung remains hypoinflated. The left lung is less well expanded today as well. The patchy increased lung markings persist and are slightly more conspicuous. The cardiac silhouette is not enlarged but its margins remained indistinct. Small amounts of pleural fluid blunt the costophrenic angles. The pulmonary vascularity is not clearly engorged. There are 8 intact sternal wires.  IMPRESSION: Slight interval worsening in the appearance of the pulmonary  interstitium today as compared to yesterday's study. This is in part due to mild hypoinflation but worsening of interstitial edema/pneumonia may be contributing to this appearance as well.   Electronically Signed   By: David  Martinique   On: 04/02/2014 07:41   Dg Chest Port 1 View  04/01/2014   CLINICAL DATA:  Respiratory failure  EXAM: PORTABLE CHEST - 1 VIEW  COMPARISON:  03/31/2014  FINDINGS: Normal heart size. Unchanged aortic tortuosity. CABG changes again noted. Bilateral airspace disease has gradually improved since 04/17/2014. Residual opacities are mainly at the bases, especially on the left. There is chronic elevation of the right diaphragm. No effusion or pneumothorax.  IMPRESSION: Clearing bilateral airspace disease, with residual consolidation mainly in the left lower lobe.   Electronically Signed   By: Jorje Guild M.D.   On: 04/01/2014 06:07     ASSESSMENT / PLAN:  PULMONARY A: Hypoxic respiratory failure with bilateral patchy infiltrate -- HCAP certain possibility but ?other etiology ie ILD v malignancy.   HCAP - pct not impressive  P:   Have maxed diuresis soon may dc lasix BD's  F/u CXR as improved last 24 hr with neg 2.6 liters DNR/DNI Attempt cycle off 1-2 hr, then 4 on Autoimmune  - RF neg, ana neg, C3 C4 neg anca >>> Limited response to steroids, although today able to have off bipap longer, slight reduction, autoimmune work up neg thus far  CARDIOVASCULAR A: History of CAD Pulmonary edema, favor 2/2 decompensated systolic dysfunction. P:   Pressors would not change outcome Reduce lasix to off, maxed efforts with this  RENAL A: Hyponatremia - mild  hypoK P:   F/u chem  Continued lasix but reduce to 20 qday Small K supp  GASTROINTESTINAL A: Gastritis History of colon CA Weight loss Malnutrition P:   Chronic issues PPI BID As off NIMV some time, will have SLP back NO ROLE TPN  HEMATOLOGIC A: History of Colon CA Iron def anemia P:   H&H  stable; no acute issues dvt prevention Limit phlebotomy  INFECTIOUS A: Possible HCAP - pct not impressive  P:   BCx2 12/9>> Urine 12/9>>>neg Vancomycin 12/11>>>12/13 levaquin 12/11>>>stop date in place Cefepime 12/10>>>12/13  ENDOCRINE A: Hypothryoid P:   Continue Synthroid   NEUROLOGIC A: Seizure d/o Prior stroke Expressive aphasia P:   Had previously been anticoagulated (hx AF w/ high CHADS2VASC score). When able to take oral , can restart anticoagulation consideration  NCB  Global:to sdu, some improvement off NIMV, dc lasix, slp repeat  Lavon Paganini. Titus Mould, MD, [place] Pgr: [place] [facility] 04/02/2014 3:52 PM

## 2014-04-02 NOTE — Evaluation (Signed)
Clinical/Bedside Swallow Evaluation Patient Details  Name: FREDRICO BEEDLE MRN: [id number] Date of Birth: [birth year]  Today's Date: 04/02/2014 Time: 1315-1335 SLP Time Calculation (min) (ACUTE ONLY): 20 min  Past Medical History:  Past Medical History  Diagnosis Date  . PVD (peripheral vascular disease)     Severe lower extremitiy  . Type 2 diabetes mellitus   . Mixed hyperlipidemia   . Obesity   . Seizure disorder   . OSA (obstructive sleep apnea)   . CVA (cerebrovascular accident) 2004    Persistent residual expressive aphasia  . Coronary atherosclerosis of native coronary artery     Multivessel  . Chest discomfort     Chronic on narcotics - Dr. Woody Seller  . Cervical spine disease   . Iron deficiency anemia     Managed by Dr. Marin Olp  . PAF (paroxysmal atrial fibrillation)   . Carotid artery disease   . GERD (gastroesophageal reflux disease)   . Myocardial infarction   . Colon cancer 2007   Past Surgical History:  Past Surgical History  Procedure Laterality Date  . Carotid endarterectomy      Left  . Partial colectomy      2007  . Coronary artery bypass graft  05/2006    LIMA to diagonal, RIMA to LAD, radial to OM, radial to PDA  . Left shoulder surgery    . Cholecystectomy    . Femoral artery - popliteal artery bypass graft      Bilateral  . Tonsillectomy and adenoidectomy    . Esophagogastroduodenoscopy N/A 03/22/2014    Procedure: ESOPHAGOGASTRODUODENOSCOPY (EGD) Esophageal dilation;  Surgeon: Rogene Houston, MD;  [facility];  Service: Endoscopy;  Laterality: N/A;  Venia Minks dilation N/A 03/22/2014    Procedure: Venia Minks DILATION;  Surgeon: Rogene Houston, MD;  [facility];  Service: Endoscopy;  Laterality: N/A;   HPI:  Mr. Sukhdeep Wieting is a 68 y.o. year old male with significant past medical history of afib on xarelto, CVA (2004) with residual expressive aphasia, PVD, CAD, HTN, type 2 DM, chronic chest pain presenting with weakness, acute resp  failure w/ hypoxia, encephalopathy. Pt resident of local SNF.  Admitted to [facility] on 12/9; transferred to [facility] 12/11 with progressive respiratory failure concerning for ARDS.  Per chart review, MBSS completed 02/22/14 at [facility] and oropharyngeal swallow was essentially [facility], however retention of bolus noted in esophagus.  Pt has required bipap since transfer to [facility]  Today tolerating NRB with monitoring.       Assessment / Plan / Recommendation Clinical Impression  Pt demonstrates an oropharyngeal dysphagia likely impacted by impaired mental status. During this assessment he seemed confused with expressive aphasia (from a previous CVA) limiting his ability to communicate effectively. SLP provided trials of ice chips, thin liquids and purees. Difficulty with oral control and manipulation evident across textures. Sips of thin liquid (water) were attempted via cup and teaspoon, resulting in signs of penetration/aspiration (delayed and immediate cough). No signs of aspiration noted with puree bolus, but pt showed increased effort and distaste. At the time of this assessment he continues to need non re-breather mask to maintain oxygen saturation. Recommend that he remain NPO for now with ice chips provided PRN after oral care. SLP will continue to follow and advance diet as appropriate.     Aspiration Risk  Moderate    Diet Recommendation NPO;Ice chips PRN after oral care   Medication Administration: Via alternative means Supervision: Full supervision/cueing for compensatory strategies  Compensations: Slow rate;Small sips/bites Postural Changes and/or Swallow Maneuvers: Seated upright 90 degrees    Other  Recommendations Oral Care Recommendations: Oral care Q4 per protocol   Follow Up Recommendations  Inpatient Rehab    Frequency and Duration min 2x/week  2 weeks   Pertinent Vitals/Pain none    SLP Swallow Goals     Swallow Study Prior Functional Status       General Date of Onset:  04/17/2014 HPI: Mr. Trajan Grove is a 68 y.o. year old male with significant past medical history of afib on xarelto, CVA (2004) with residual expressive aphasia, PVD, CAD, HTN, type 2 DM, chronic chest pain presenting with weakness, acute resp failure w/ hypoxia, encephalopathy. Pt resident of local SNF.  Admitted to [facility] on 12/9; transferred to [facility] 12/11 with progressive respiratory failure concerning for ARDS.  Per chart review, MBSS completed 02/22/14 at [facility] and oropharyngeal swallow was essentially [facility], however retention of bolus noted in esophagus.  Pt has required bipap since transfer to [facility].  Today tolerating NRB with monitoring.     Type of Study: Bedside swallow evaluation Previous Swallow Assessment: None on record Diet Prior to this Study: NPO Temperature Spikes Noted: Yes Respiratory Status: non-rebreather History of Recent Intubation: No Behavior/Cognition: Alert;Cooperative;Confused;Requires cueing Oral Cavity - Dentition: Dentures, not available;Dentures, top;Edentulous Self-Feeding Abilities: Needs assist Patient Positioning: Upright in bed Baseline Vocal Quality: Clear Volitional Cough: Strong Volitional Swallow: Unable to elicit    Oral/Motor/Sensory Function Overall Oral Motor/Sensory Function: Appears within functional limits for tasks assessed   Ice Chips Ice chips: Within functional limits Presentation: Spoon   Thin Liquid Thin Liquid: Impaired Presentation: Spoon;Cup Pharyngeal  Phase Impairments: Multiple swallows;Cough - Delayed;Suspected delayed Swallow    Nectar Thick     Honey Thick     Puree Puree: Within functional limits Presentation: Spoon   Solid   GO            Eden Emms 04/02/2014,2:34 PM

## 2014-04-03 ENCOUNTER — Encounter (INDEPENDENT_AMBULATORY_CARE_PROVIDER_SITE_OTHER): Payer: Self-pay

## 2014-04-03 ENCOUNTER — Inpatient Hospital Stay (HOSPITAL_COMMUNITY): Payer: Medicare Other

## 2014-04-03 LAB — ANCA TITERS: Atypical P-ANCA titer: 1:320 {titer} — ABNORMAL HIGH

## 2014-04-03 LAB — GLUCOSE, CAPILLARY
GLUCOSE-CAPILLARY: 159 mg/dL — AB (ref 70–99)
GLUCOSE-CAPILLARY: 168 mg/dL — AB (ref 70–99)
GLUCOSE-CAPILLARY: 222 mg/dL — AB (ref 70–99)
Glucose-Capillary: 182 mg/dL — ABNORMAL HIGH (ref 70–99)
Glucose-Capillary: 253 mg/dL — ABNORMAL HIGH (ref 70–99)
Glucose-Capillary: 214 mg/dL — ABNORMAL HIGH (ref 70–99)

## 2014-04-03 LAB — BASIC METABOLIC PANEL
Anion gap: 19 — ABNORMAL HIGH (ref 5–15)
BUN: 47 mg/dL — AB (ref 6–23)
CHLORIDE: 103 meq/L (ref 96–112)
CO2: 23 meq/L (ref 19–32)
CREATININE: 0.67 mg/dL (ref 0.50–1.35)
Calcium: 10 mg/dL (ref 8.4–10.5)
GFR calc Af Amer: >90 mL/min (ref >90)
GFR calc non Af Amer: >90 mL/min (ref >90)
GLUCOSE: 205 mg/dL — AB (ref 70–99)
Potassium: 4.4 mEq/L (ref 3.7–5.3)
Sodium: 145 mEq/L (ref 137–147)

## 2014-04-03 LAB — ANCA SCREEN W REFLEX TITER
Atypical p-ANCA Screen: POSITIVE — AB
c-ANCA Screen: NEGATIVE
p-ANCA Screen: NEGATIVE

## 2014-04-03 MED ORDER — DEXTROSE 5 % IV SOLN
1.0000 mg/h | INTRAVENOUS | Status: DC
  Administered 2014-04-03: 2 mg/h via INTRAVENOUS
  Administered 2014-04-03: 4 mg/h via INTRAVENOUS
  Administered 2014-04-04: 6 mg/h via INTRAVENOUS
  Administered 2014-04-04 – 2014-04-05 (×3): 5 mg/h via INTRAVENOUS
  Filled 2014-04-03 (×4): qty 25

## 2014-04-03 MED ORDER — DILTIAZEM HCL 25 MG/5ML IV SOLN
5.0000 mg | Freq: Once | INTRAVENOUS | Status: AC
  Administered 2014-04-03: 5 mg via INTRAVENOUS
  Filled 2014-04-03: qty 5

## 2014-04-03 MED ORDER — LORAZEPAM 2 MG/ML IJ SOLN
1.0000 mg | INTRAMUSCULAR | Status: DC | PRN
  Administered 2014-04-03 (×2): 2 mg via INTRAVENOUS
  Filled 2014-04-03 (×2): qty 1

## 2014-04-03 MED ORDER — LORAZEPAM 2 MG/ML IJ SOLN
INTRAMUSCULAR | Status: AC
  Administered 2014-04-03: 2 mg
  Filled 2014-04-03: qty 1

## 2014-04-03 MED ORDER — LEVETIRACETAM IN NACL 1000 MG/100ML IV SOLN
1000.0000 mg | Freq: Two times a day (BID) | INTRAVENOUS | Status: DC
  Administered 2014-04-03 – 2014-04-04 (×3): 1000 mg via INTRAVENOUS
  Filled 2014-04-03 (×7): qty 100

## 2014-04-03 NOTE — Progress Notes (Signed)
SLP Cancellation Note  Patient Details Name: [name] MRN: [id number] DOB: 10-02-1945   Cancelled treatment:     Returned to determine readiness for POs.  Pt remains on NRB; Palliative Care consult is pending.  He was not responsive verbally nor with eye contact.  Will follow chart for [place].      Juan Quam Laurice 04/03/2014, 3:43 PM

## 2014-04-03 NOTE — Progress Notes (Signed)
Sister Karna Christmas [phone] called to request a palliative care consult. She is P.O.A. Pt is tremulous at baseline with garbled speech. Witnessed seizure at 12:25: eyes deviated to right, bilat jerking lasting 3 minutes. T.Max 102 with Tylenol given q 6hours and frequent oral care. Pccm made aware of changes .

## 2014-04-03 NOTE — Progress Notes (Signed)
SLP Cancellation Note  Patient Details Name: JSOEPH PODESTA MRN: [id number] DOB: 06-Oct-1945   Cancelled treatment:       Reason Eval/Treat Not Completed: Medical issues which prohibited therapy. Pt currently on BiPAP. RN requests SLP return in pm, RN will talk to respiratory therapist about readying pt for swallowing if possible. Can reach SLP at [phone]   Lynann Beaver 04/03/2014, 9:22 AM

## 2014-04-03 NOTE — Progress Notes (Signed)
Multiple seizure noted during assessment. Pupils were unequal with the right greater than the left. Pt was unresponsive and unable to follow commands. MD was notified and orders given. Family is at the beside and have been updated.  Alessandra Grout, RN.

## 2014-04-03 NOTE — Progress Notes (Signed)
PULMONARY / CRITICAL CARE MEDICINE    Name: [name] MRN: [id number] DOB: 1945/12/24    ADMISSION DATE:  03/20/2014  PRIMARY SERVICE: PCCM  CHIEF COMPLAINT:  Dyspnea  BRIEF PATIENT DESCRIPTION: 68 y/o man with poor functional status and overall progressive decline presented to [facility] 12/9 with dyspnea and hypoxia,  Admitted and treated for HCAP.  Progressive respiratory failure concerning for ARDS.  Tx 12/11 to [facility] for PCCM mgmt and ? FOB.    Of note, the patient has a brother with recently diagnosed ILD (per sister, is presumed IPF and was started on either pirfenidone and nintedanib).  SIGNIFICANT EVENTS / STUDIES:  12/11>>Transferred from [facility], stable on BiPAP. 12/11>> family meeting, DNR/DNI 12/12-worsening resp status 12/13- refused NGT  SUBJECTIVE:   Febrile to 102F and then witnessed seizure activity. Was able to get depakote and then tylenol PR w some improvement  VITAL SIGNS: Temp:  [96.3 F (35.7 C)-102.1 F (38.9 C)] 102.1 F (38.9 C) (12/16 1211) Pulse Rate:  [63-151] 112 (12/16 0838) Resp:  [14-38] 38 (12/16 0838) BP: (106-171)/(65-96) 155/77 mmHg (12/16 1211) SpO2:  [90 %-96 %] 95 % (12/16 0838) Weight:  [76 kg (167 lb 8.8 oz)] 76 kg (167 lb 8.8 oz) (12/16 0550) HEMODYNAMICS:   VENTILATOR SETTINGS:   INTAKE / OUTPUT: Intake/Output      12/15 0701 - 12/16 0700 12/16 0701 - 12/17 0700   I.V. (mL/kg) 180 (2.4) 112.2 (1.5)   IV Piggyback 315    Total Intake(mL/kg) 495 (6.5) 112.2 (1.5)   Urine (mL/kg/hr) 1200 (0.7) 200 (0.4)   Total Output 1200 200   Net -705 -87.8          PHYSICAL EXAMINATION: General:  Chronically ill appearing male, tremor Neuro: sedate, tremulous especially L arm, upward gaze HEENT: jvd flat Cardiovascular:  s1 s2 No M/R/G Lungs:  B coarse BS Abdomen:  Obese, nt, nd, no r /g Musculoskeletal:  No deformities Skin:  Several scars noted on UE.  LABS:  CBC  Recent Labs Lab 03/31/14 0240 04/01/14 0245  04/02/14 0227  WBC 8.8 9.6 9.3  HGB 14.2 15.9 15.3  HCT 42.7 46.0 45.7  PLT 231 308 344   Coag's No results for input(s): APTT, INR in the last 168 hours. BMET  Recent Labs Lab 04/01/14 0245 04/02/14 0227 04/03/14 0535  NA 140 141 145  K 3.4* 3.8 4.4  CL 96 99 103  CO2 25 25 23   BUN 35* 45* 47*  CREATININE 0.71 0.68 0.67  GLUCOSE 183* 180* 205*   Electrolytes  Recent Labs Lab 04/01/14 0245 04/02/14 0227 04/03/14 0535  CALCIUM 9.5 9.5 10.0   Sepsis Markers  Recent Labs Lab 04/14/2014 1825 03/29/2014 2218 03/28/14 1052 03/30/14 1334 03/31/14 0240 04/01/14 0245  LATICACIDVEN 4.35* 4.29* 1.5  --   --   --   PROCALCITON  --   --   --  2.88 1.88 1.03   ABG  Recent Labs Lab 03/29/14 0513 03/29/14 2036 03/30/14 0306  PHART 7.437 7.435 7.538*  PCO2ART 38.6 34.8* 26.7*  PO2ART 56.7* 62.0* 87.0   Liver Enzymes  Recent Labs Lab 03/24/2014 1935 03/28/14 0429  AST 21 33  ALT 5 <5  ALKPHOS 39 35*  BILITOT 0.5 0.4  ALBUMIN 2.3* 2.0*   Cardiac Enzymes  Recent Labs Lab 04/17/2014 1935  TROPONINI <0.30  PROBNP 2710.0*   Glucose  Recent Labs Lab 04/02/14 1544 04/02/14 2133 04/03/14 0010 04/03/14 0427 04/03/14 0854 04/03/14 1207  GLUCAP  188* 152* 168* 222* 182* 253*    Imaging Dg Chest Port 1 View  04/03/2014   CLINICAL DATA:  Pneumonia and shortness of breath.  EXAM: PORTABLE CHEST - 1 VIEW  COMPARISON:  Portable chest x-ray of April 02, 2014 next annual  FINDINGS: The lungs are mildly hypoinflated. The interstitial markings remain increased. Confluent densities lie adjacent to the left heart border and are stable. The left hemidiaphragm is slightly better demonstrated today. The cardiac silhouette normal in size. The pulmonary vascularity is not engorged. 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  IMPRESSION: There are interstitial infiltrates bilaterally in both lungs with slight improvement noted on the right. Alveolar infiltrate in  the lingula is stable. Overall there has not been dramatic interval change since yesterday's study.   Electronically Signed   By: David  Martinique   On: 04/03/2014 07:30   Dg Chest Port 1 View  04/02/2014   CLINICAL DATA:  ARDS, history of obesity, diabetes and coronary artery disease  EXAM: PORTABLE CHEST - 1 VIEW  COMPARISON:  Portable chest x-ray of April 01, 2014 at 5:07 a.m.  FINDINGS: The right lung remains hypoinflated. The left lung is less well expanded today as well. The patchy increased lung markings persist and are slightly more conspicuous. The cardiac silhouette is not enlarged but its margins remained indistinct. Small amounts of pleural fluid blunt the costophrenic angles. The pulmonary vascularity is not clearly engorged. There are 8 intact sternal wires.  IMPRESSION: Slight interval worsening in the appearance of the pulmonary interstitium today as compared to yesterday's study. This is in part due to mild hypoinflation but worsening of interstitial edema/pneumonia may be contributing to this appearance as well.   Electronically Signed   By: David  Martinique   On: 04/02/2014 07:41     ASSESSMENT / PLAN:  PULMONARY A: Hypoxic respiratory failure with bilateral patchy infiltrates, etiology unclear HCAP P:   Have maximized diuresis, lasix held BD's  F/u CXR DNR/DNI Overall decline in fxn, family now moving towards a palliative approach. Consider using BiPAP only for WOB and comfort. Agree with Palliative Care evaluation Autoimmune  - RF neg, ana neg, C3 C4 neg anca >>> pending Limited response to steroids, weaning  CARDIOVASCULAR A: History of CAD Pulmonary edema, favor 2/2 decompensated systolic dysfunction. P:   Pressors would not change outcome Lasix held  RENAL A: Hyponatremia - mild  hypoK P:   F/u chem   GASTROINTESTINAL A: Gastritis History of colon CA Weight loss Malnutrition P:   PPI BID Have refused NGT. May be able to participate with SLP in the  future, not currently NO ROLE TPN  HEMATOLOGIC A: History of Colon CA Iron def anemia P:   H&H stable; no acute issues dvt prevention Limit phlebotomy  INFECTIOUS A: Possible HCAP - pct not impressive  P:   BCx2 12/9>> Urine 12/9>>>neg Vancomycin 12/11>>>12/13 levaquin 12/11>>>stop date in place Cefepime 12/10>>>12/13  ENDOCRINE A: Hypothryoid P:   Continue Synthroid   NEUROLOGIC A: Seizure d/o Prior stroke Expressive aphasia P:   Seizure noted this am, likely due to fever; tylenol ordered Had previously been anticoagulated (hx AF w/ high CHADS2VASC score). When able to take oral , consider anticoag. Will depend on overall goals of care  NCB  Baltazar Apo, MD, PhD 04/03/2014, 1:45 PM [facility] [phone] or if no answer [phone]

## 2014-04-03 NOTE — Progress Notes (Signed)
Family at bedside. Patient continues to have seizures. Ativan X 2 given. Will continue to monitor.

## 2014-04-03 NOTE — Progress Notes (Signed)
Patient is currently on 100%NRB with sats at 93%. Patients wife stated she did not want her husband placed on BIPAP because she wants him to be comfortable. RT will continue to monitor.

## 2014-04-03 NOTE — Progress Notes (Signed)
eLink Physician-Brief Progress Note Patient Name: JERI RAWLINS DOB: 08-06-1945 MRN: [id number]   Date of Service  04/03/2014  HPI/Events of Note  Ongoing seizures Discussed with pharmacy > levaquin play little role with seizures  eICU Interventions  Add keppra Continue PRN ativan If no improvement may need benzo infusion     Intervention Category Major Interventions: Seizures - evaluation and management  MCQUAID, DOUGLAS 04/03/2014, 8:39 PM

## 2014-04-03 NOTE — Progress Notes (Signed)
eLink Physician-Brief Progress Note Patient Name: [name] DOB: 1945/06/04 MRN: [id number]   Date of Service  04/03/2014  HPI/Events of Note  Frequent seizures with fever On depakote Chart reviewed, DNR  eICU Interventions  Will add prn ativan  Discuss levaquin effect on seizure threshold with pharmacy May need keppra if ativan doesn't help     Intervention Category Major Interventions: Seizures - evaluation and management  Bobette Leyh 04/03/2014, 6:31 PM

## 2014-04-04 DIAGNOSIS — Z515 Encounter for palliative care: Secondary | ICD-10-CM

## 2014-04-04 DIAGNOSIS — R0600 Dyspnea, unspecified: Secondary | ICD-10-CM

## 2014-04-04 LAB — GLUCOSE, CAPILLARY
GLUCOSE-CAPILLARY: 156 mg/dL — AB (ref 70–99)
Glucose-Capillary: 160 mg/dL — ABNORMAL HIGH (ref 70–99)
Glucose-Capillary: 172 mg/dL — ABNORMAL HIGH (ref 70–99)

## 2014-04-04 MED ORDER — MORPHINE SULFATE (CONCENTRATE) 10 MG/0.5ML PO SOLN: 5.0000 mg | ORAL | Status: DC | PRN

## 2014-04-04 MED ORDER — FENTANYL CITRATE 0.05 MG/ML IJ SOLN: 50.0000 ug | INTRAMUSCULAR | Status: DC | PRN

## 2014-04-04 MED ORDER — ATROPINE SULFATE 1 % OP SOLN
1.0000 [drp] | OPHTHALMIC | Status: DC | PRN
  Filled 2014-04-04: qty 2

## 2014-04-04 MED ORDER — WHITE PETROLATUM GEL
Status: AC
  Administered 2014-04-04: 18:00:00
  Filled 2014-04-04: qty 5

## 2014-04-04 NOTE — Consult Note (Signed)
Patient [name]      DOB: 1945/07/02      JKD:[id number]     Consult Note from the Palliative Medicine Team at [place] Requested by: Dr Huntley Estelle     PCP: Glenda Chroman., MD Reason for Consultation: Clarification of [place] and options    Phone Number:[phone]  Assessment of patients Current state:   Patient just transferred from step-down unit to floor.  Family at bedside.  Patient continues with a face mask for oxygen Family made decision for shift to full comfort with the guidance of CCM earlier today.  Patient appears to be actively dying at this time.    Consult is for clarification of goals of care and end of life issues, symptom recommendation and emotional support at this time of transition to EOL  This NP Wadie Lessen reviewed medical records, received report from team, assessed the patient and then meet at the patient's bedside along with sister/HPOA Darlen Round and another sibling to discuss natural trajectory and expectations at EOL.  Family infomed that time is very limited, likely hours at best  Questions and concerns addressed.   Family encouraged to call with questions or concerns.  PMT will continue to support holistically.  Values and goals of care important to patient and family were elicited. Family discussed past experience with death. Allowed space for family to share life review for their brother.  Decline chaplain visit at this time.    Goals of Care:  Full comfort at EOL.  Prognosis is likely hours.  Lips and feet are mottled  4. Symptom Management:   1. Anxiety/Agitation:  Ativan gtt IV presently at 5 ml/hr 2. Pain/Dyspnea: Fentanyl 50 mcg IV every 1 hr prn                 -convert face mask to nasal cannula (2 liters)for comfort  Brief HPI:   68 y/o man with poor functional status and overall progressive decline presented to [facility] 12/9 with dyspnea and hypoxia, Admitted and treated for HCAP. Progressive respiratory failure  concerning for ARDS. Tx 12/11 to [facility] for PCCM mgmt.  Continued decline in spite of medical interventions, poor prognosis, family decision for comfort  ROS: unable to illicit   PMH:  Past Medical History  Diagnosis Date  . PVD (peripheral vascular disease)     Severe lower extremitiy  . Type 2 diabetes mellitus   . Mixed hyperlipidemia   . Obesity   . Seizure disorder   . OSA (obstructive sleep apnea)   . CVA (cerebrovascular accident) 2004    Persistent residual expressive aphasia  . Coronary atherosclerosis of native coronary artery     Multivessel  . Chest discomfort     Chronic on narcotics - Dr. Woody Seller  . Cervical spine disease   . Iron deficiency anemia     Managed by Dr. Marin Olp  . PAF (paroxysmal atrial fibrillation)   . Carotid artery disease   . GERD (gastroesophageal reflux disease)   . Myocardial infarction   . Colon cancer 2007     PSH: Past Surgical History  Procedure Laterality Date  . Carotid endarterectomy      Left  . Partial colectomy      2007  . Coronary artery bypass graft  05/2006    LIMA to diagonal, RIMA to LAD, radial to OM, radial to PDA  . Left shoulder surgery    . Cholecystectomy    . Femoral artery -  popliteal artery bypass graft      Bilateral  . Tonsillectomy and adenoidectomy    . Esophagogastroduodenoscopy N/A 03/22/2014    Procedure: ESOPHAGOGASTRODUODENOSCOPY (EGD) Esophageal dilation;  Surgeon: Rogene Houston, MD;  [facility];  Service: Endoscopy;  Laterality: N/A;  Venia Minks dilation N/A 03/22/2014    Procedure: Venia Minks DILATION;  Surgeon: Rogene Houston, MD;  Location[facility];  Service: Endoscopy;  Laterality: N/A;   I have reviewed the [place] and SH and  If appropriate update it with new information. Allergies  Allergen Reactions  . Morphine And Related Other (See Comments)    Per MAR  . Sulfate    Scheduled Meds: . chlorhexidine  15 mL Mouth Rinse BID  . levETIRAcetam  1,000 mg Intravenous Q12H  .  sodium chloride  3 mL Intravenous Q12H  . valproate sodium  500 mg Intravenous 3 times per day   Continuous Infusions: . sodium chloride 10 mL/hr at 03/31/14 1000  . LORazepam (ATIVAN) infusion 5 mg/hr (04/04/14 0639)   PRN Meds:.acetaminophen **OR** acetaminophen, atropine, LORazepam, ondansetron **OR** ondansetron (ZOFRAN) IV    BP 97/70 mmHg  Pulse 104  Temp(Src) 99 F (37.2 C) (Axillary)  Resp 18  Ht 5\' 9"  (1.753 m)  Wt 73.7 kg (162 lb 7.7 oz)  BMI 23.98 kg/m2  SpO2 93%   PPS: 10 %   Intake/Output Summary (Last 24 hours) at 04/04/14 1412 Last data filed at 04/04/14 0700  Gross per 24 hour  Intake  568.2 ml  Output    250 ml  Net  318.2 ml    Physical Exam:  General: appears to be actively dying, buccal mottling noted Chest:  Diminished  CVS: Tachycardic Extremities: BLE mottling (knees to toes) Neuro:  unresponsive to gentle touch and verbal stimuli  Labs: CBC    Component Value Date/Time   WBC 9.3 04/02/2014 0227   WBC 5.4 07/31/2009 0936   WBC 5.1 10/09/2007 0914   RBC 5.05 04/02/2014 0227   RBC 3.56* 12/25/2010 0230   RBC 3.64* 07/31/2009 0936   RBC 3.82* 10/09/2007 0914   HGB 15.3 04/02/2014 0227   HGB 11.2* 07/31/2009 0936   HGB 11.9* 10/09/2007 0914   HCT 45.7 04/02/2014 0227   HCT 33.4* 07/31/2009 0936   HCT 33.9* 10/09/2007 0914   PLT 344 04/02/2014 0227   PLT 194 07/31/2009 0936   PLT 230 10/09/2007 0914   MCV 90.5 04/02/2014 0227   MCV 92 07/31/2009 0936   MCV 88.8 10/09/2007 0914   MCH 30.3 04/02/2014 0227   MCH 30.9 07/31/2009 0936   MCH 31.2 10/09/2007 0914   MCHC 33.5 04/02/2014 0227   MCHC 33.6 07/31/2009 0936   MCHC 35.1 10/09/2007 0914   RDW 16.3* 04/02/2014 0227   RDW 12.4 07/31/2009 0936   RDW 13.0 10/09/2007 0914   LYMPHSABS 1.5 04/02/2014 0227   LYMPHSABS 1.6 07/31/2009 0936   LYMPHSABS 1.9 10/09/2007 0914   MONOABS 1.4* 04/02/2014 0227   MONOABS 0.5 10/09/2007 0914   EOSABS 0.0 04/02/2014 0227   EOSABS 0.2  07/31/2009 0936   EOSABS 0.1 10/09/2007 0914   BASOSABS 0.0 04/02/2014 0227   BASOSABS 0.0 07/31/2009 0936   BASOSABS 0.0 10/09/2007 0914    BMET    Component Value Date/Time   NA 145 04/03/2014 0535   K 4.4 04/03/2014 0535   CL 103 04/03/2014 0535   CO2 23 04/03/2014 0535   GLUCOSE 205* 04/03/2014 0535   BUN 47* 04/03/2014 0535  CREATININE 0.67 04/03/2014 0535   CALCIUM 10.0 04/03/2014 0535   GFRNONAA >90 04/03/2014 0535   GFRAA >90 04/03/2014 0535    CMP     Component Value Date/Time   NA 145 04/03/2014 0535   K 4.4 04/03/2014 0535   CL 103 04/03/2014 0535   CO2 23 04/03/2014 0535   GLUCOSE 205* 04/03/2014 0535   BUN 47* 04/03/2014 0535   CREATININE 0.67 04/03/2014 0535   CALCIUM 10.0 04/03/2014 0535   PROT 5.8* 03/28/2014 0429   ALBUMIN 2.0* 03/28/2014 0429   AST 33 03/28/2014 0429   ALT <5 03/28/2014 0429   ALKPHOS 35* 03/28/2014 0429   BILITOT 0.4 03/28/2014 0429   GFRNONAA >90 04/03/2014 0535   GFRAA >90 04/03/2014 0535    Time In Time Out Total Time Spent with Patient Total Overall Time  1400 1445 35 min 45 min    Greater than 50%  of this time was spent counseling and coordinating care related to the above assessment and plan.   Wadie Lessen NP  Palliative Medicine Team Team Phone # [phone] Pager [phone]  Discussed with Samuel Germany NP

## 2014-04-04 NOTE — Progress Notes (Signed)
Report called to 6N

## 2014-04-04 NOTE — Progress Notes (Signed)
PULMONARY / CRITICAL CARE MEDICINE    Name: [name] MRN: [id number] DOB: Jan 30, 1946    ADMISSION DATE:  04/01/2014  PRIMARY SERVICE: PCCM  CHIEF COMPLAINT:  Dyspnea  BRIEF PATIENT DESCRIPTION: 68 y/o man with poor functional status and overall progressive decline presented to [facility] 12/9 with dyspnea and hypoxia,  Admitted and treated for HCAP.  Progressive respiratory failure concerning for ARDS.  Tx 12/11 to [facility] for PCCM mgmt and ? FOB.    Of note, the patient has a brother with recently diagnosed ILD (per sister, is presumed IPF and was started on either pirfenidone and nintedanib).  SIGNIFICANT EVENTS / STUDIES:  12/11  Transferred from [facility], stable on BiPAP. 12/11  Family meeting, DNR/DNI 12/12  Worsening resp status 12/13  Refused NGT 12/16  Fevers, seizure like activity.  Family decided to forego aggressive anti-seizure rx and transition toward comfort care  SUBJECTIVE:  Fevers & ongoing seizure activity overnight.  Family requested to transition toward comfort care.    VITAL SIGNS: Temp:  [97.5 F (36.4 C)-102.1 F (38.9 C)] 97.5 F (36.4 C) (12/17 0800) Pulse Rate:  [90-113] 103 (12/17 0800) Resp:  [17-35] 20 (12/17 0800) BP: (103-155)/(62-77) 103/62 mmHg (12/17 0800) SpO2:  [92 %-95 %] 92 % (12/17 0800)  INTAKE / OUTPUT: Intake/Output      12/16 0701 - 12/17 0700 12/17 0701 - 12/18 0700   I.V. (mL/kg) 338.2 (4.5)    IV Piggyback 415    Total Intake(mL/kg) 753.2 (9.9)    Urine (mL/kg/hr) 450 (0.2)    Total Output 450     Net +303.2            PHYSICAL EXAMINATION: General:  Chronically ill appearing male, appears in transition toward death Neuro: obtunded HEENT: jvd flat Cardiovascular:  s1 s2 No M/R/G Lungs:  B coarse BS Abdomen:  Obese, nt, nd, no r /g Musculoskeletal:  No deformities Skin:  Several scars noted on UE.  LABS:  CBC  Recent Labs Lab 03/31/14 0240 04/01/14 0245 04/02/14 0227  WBC 8.8 9.6 9.3  HGB 14.2 15.9 15.3   HCT 42.7 46.0 45.7  PLT 231 308 344   BMET  Recent Labs Lab 04/01/14 0245 04/02/14 0227 04/03/14 0535  NA 140 141 145  K 3.4* 3.8 4.4  CL 96 99 103  CO2 25 25 23   BUN 35* 45* 47*  CREATININE 0.71 0.68 0.67  GLUCOSE 183* 180* 205*   Glucose  Recent Labs Lab 04/03/14 1207 04/03/14 1720 04/03/14 2018 04/04/14 0039 04/04/14 0447 04/04/14 0747  GLUCAP 253* 214* 159* 156* 160* 172*    Imaging Dg Chest Port 1 View  04/03/2014   CLINICAL DATA:  Pneumonia and shortness of breath.  EXAM: PORTABLE CHEST - 1 VIEW  COMPARISON:  Portable chest x-ray of April 02, 2014 next annual  FINDINGS: The lungs are mildly hypoinflated. The interstitial markings remain increased. Confluent densities lie adjacent to the left heart border and are stable. The left hemidiaphragm is slightly better demonstrated today. The cardiac silhouette normal in size. The pulmonary vascularity is not engorged. 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  IMPRESSION: There are interstitial infiltrates bilaterally in both lungs with slight improvement noted on the right. Alveolar infiltrate in the lingula is stable. Overall there has not been dramatic interval change since yesterday's study.   Electronically Signed   By: David  Martinique   On: 04/03/2014 07:30     ASSESSMENT / PLAN:  PULMONARY A:  Hypoxic respiratory failure with bilateral patchy infiltrates, etiology unclear HCAP P:   BD's  DNR/DNI Palliative Care evaluation  Transition toward full comfort measures Autoimmune  - RF neg, ana neg, C3 C4 neg anca >>> pending D/c further steroids O2 to 2 L with no further titration  CARDIOVASCULAR A: History of CAD Pulmonary edema, favor 2/2 decompensated systolic dysfunction. P:   No further aggressive interventions   RENAL A: Hyponatremia - mild  hypoK P:   No further lab draws  GASTROINTESTINAL A: Gastritis History of colon CA Weight loss Malnutrition P:   D/C PPI Comfort  feeding only if pt were to request  Oral care  HEMATOLOGIC A: History of Colon CA Iron def anemia P:   No further lab draws  INFECTIOUS A: Possible HCAP - pct not impressive  P:   BCx2 12/9>>neg Urine 12/9>>>neg RVP 12/11 >> neg Sputum 12/10 >> neg  Vancomycin 12/11>>>12/13 levaquin 12/11>>>stop date in place Cefepime 12/10>>>12/13  ENDOCRINE A: Hypothryoid P:   Hold further Synthroid   NEUROLOGIC A: Seizure d/o Prior stroke Expressive aphasia P:   Tylenol PRN for comfort Ativan gtt for seizures + comfort   Full NCB, transition to palliative care floor.  Likely hours to days.     Noe Gens, NP-C [facility] Pgr: [phone] or [phone]   Attending Note:  I have examined patient, reviewed labs, studies and notes. I have discussed the case with B Ollis and I agree with the data and plans as amended above. No seizure activity on my exam. Working on transition to [place] bed  Baltazar Apo, MD, PhD 04/04/2014, 12:27 PM [facility] [phone] or if no answer [phone]

## 2014-04-04 NOTE — Progress Notes (Signed)
[facility] PCCM PROGRESS NOTE  S: Called to bedside By [facility] MD to evaluate patient for ongoing seizures. Nurses report that he has been doing this off and on all day and it was originally attributed to fever, however, his fevers have been successfully treated and seizure like activity persists. I had previously discussed with family that we would try to add on intermitten ativan and load Keppra to see if there was and relief of seizures. I was also aware that family had been considering transition to comfort measures, and in light of this development this is also something they may want to expedite. We agreed that we would give ELINK interventions (ativan, keppra) some time to work, however if we could not break seizures with those interventions we would take a more comfort base approach to his care. That conversation was about one hour ago, and he still continues to have frequent short seizure like activity. Family (sister who is POA and caretaker, and brother) have agreed to forego aggressive anti-seizure measures and initiate comfort care.   O: BP 121/70 mmHg  Pulse 113  Temp(Src) 99.8 F (37.7 C) (Axillary)  Resp 17  Ht 5\' 9"  (1.753 m)  Wt 76 kg (167 lb 8.8 oz)  BMI 24.73 kg/m2  SpO2 95%  General:  Thin elderly male with frequent seizure like activity.  Neuro:  Lethargic, non verbal. Spontaneously awake.  HEENT:  [place]/AT, pupils uneven, reactive to light, R gaze preference Neck:  Supple, no JVD noted Cardiovascular:  RRR, no MRG Lungs: coarse bilateral breath sounds.  Abdomen:  Soft, non-tender, non-distended Musculoskeletal:  No acute deformity Skin:  Intact  A/P:  Seizures Acute respiratory failure of unclear etiology ?HCAP  Deconditioning - Begin transition to comfort based care - Start lorazepam infusion - Continue existing medical therapies for now, will likely d/c in AM for full comfort   Georgann Housekeeper, ACNP [facility] Pager [phone] or [phone]

## 2014-04-09 ENCOUNTER — Ambulatory Visit: Payer: PRIVATE HEALTH INSURANCE | Admitting: Cardiology

## 2014-04-10 ENCOUNTER — Institutional Professional Consult (permissible substitution): Payer: PRIVATE HEALTH INSURANCE | Admitting: Internal Medicine

## 2014-04-19 NOTE — Progress Notes (Signed)
Patient found pulseless, breathless, unresponsive at 0510.  Death pronounced by Alba Cory RN and Clovis Pu RN.  Dr. Nelda Marseille notified.  Patient's sister and POA, Sanda Klein notified.  Family will come to see patient.  [facility] notified of patient's death.  He is not a donor.

## 2014-04-19 DEATH — deceased

## 2014-04-26 NOTE — Discharge Summary (Signed)
PULMONARY / CRITICAL CARE MEDICINE    Name: [name] MRN: [id number] DOB: 01/18/1946    ADMISSION DATE:  30-Mar-2014 DATE OF DEATH: 04/06/14  FINAL CAUSE OF DEATH Acute on chronic respiratory failure  SECONDARY CAUSES OF DEATH Acute on chronic systolic CHF Coronary artery disease Bilateral pulmonary infiltrates presumed to be cardiogenic pulmonary edema Possible healthcare associated pneumonia Possible ARDS Family history of interstitial lung disease Hyponatremia Hypokalemia Protein calorie malnutrition Hypothyroidism History of stroke with expressive aphasia Seizure disorder Peptic ulcer disease and gastritis Progressive overall functional decline/failure to thrive Peripheral vascular disease History of colon cancer Iron deficiency anemia  BRIEF PATIENT DESCRIPTION: 69 y/o man with poor functional status and overall progressive decline presented to [facility] 12/9 with dyspnea and hypoxia,  Admitted and treated for HCAP.  Progressive respiratory failure concerning for ARDS.  Tx 2023/03/31 to [facility] for PCCM mgmt and ? FOB.    Of note, the patient has a brother with recently diagnosed ILD (per sister, is presumed IPF and was started on either pirfenidone and nintedanib).  HISTORY OF PRESENT ILLNESS:  [name] is a 69 y/o man with a complex PMHx and several months of progressive decline likely due to at least in part to underlying vascular disease including CAD, stroke, and PVD, who was transferred to [facility] after worsening hypoxia and chest imaging. The patient's sister reports that he has been living at a SNF for some time, and over the last month had a significant functional decline (no longer to do ADLs, etc) as well as significant weight loss. He did report some progressive abdominal pain and underwent EGD / CSY which reportedly showed some ulceration and gastritis. It is not known if biopsies were taken. He has been admitted twice with respiratory failure, at first this was felt  to be due to heart failure exacerbation, and he did improve some with diuresis. He was then discharged 12/5 and re-presented 12/9 with dyspnea and hypoxia, with fevers as well. He was treated for HCAP with resolution of his fever and leukocytosis, but with persistent hypoxia. On the day of transfer, he had a chest CT that showed worsening scattered nodular groundglass opacities, with a dependent distribution. Of note, the patient has a brother with recently diagnosed ILD (per sister, is presumed IPF and was started on either pirfenidone and nintedanib). On arrival 03/31/2023 he was in respiratory distress but tolerating BiPAP. It was felt given the clinical history and negative procalcitonin that the patient's infiltrates were more consistent with acute systolic CHF as opposed to pneumonia. Discussions were undertaken with the patient and family on 03/31/23 and it was felt that he would not want or benefit significantly from chemical ventilation. Decision was made to transition him to a more comfort-based approach.  He unfortunately experienced progressive seizure activity. Again the decision was made to forgo intubation and to focus on his comfort. He expired on Apr 07, 2023.  SIGNIFICANT EVENTS / STUDIES:  March 31, 2023  Transferred from [facility], stable on BiPAP. 03/31/2023  Family meeting, DNR/DNI 12/12  Worsening resp status 12/13  Refused NGT 12/16  Fevers, seizure like activity.  Family decided to forego aggressive anti-seizure rx and transition toward comfort care  Baltazar Apo, MD, PhD 04/26/2014, 10:30 AM [facility] [phone] or if no answer [phone]

## 2015-11-26 IMAGING — CR DG CHEST 1V PORT
2 series · 2 of 2 positions shown · non-contrast
Comparison: 03/27/2014

CLINICAL DATA: Pneumonia

EXAM:
PORTABLE CHEST - 1 VIEW

[AP (1 of 2)]
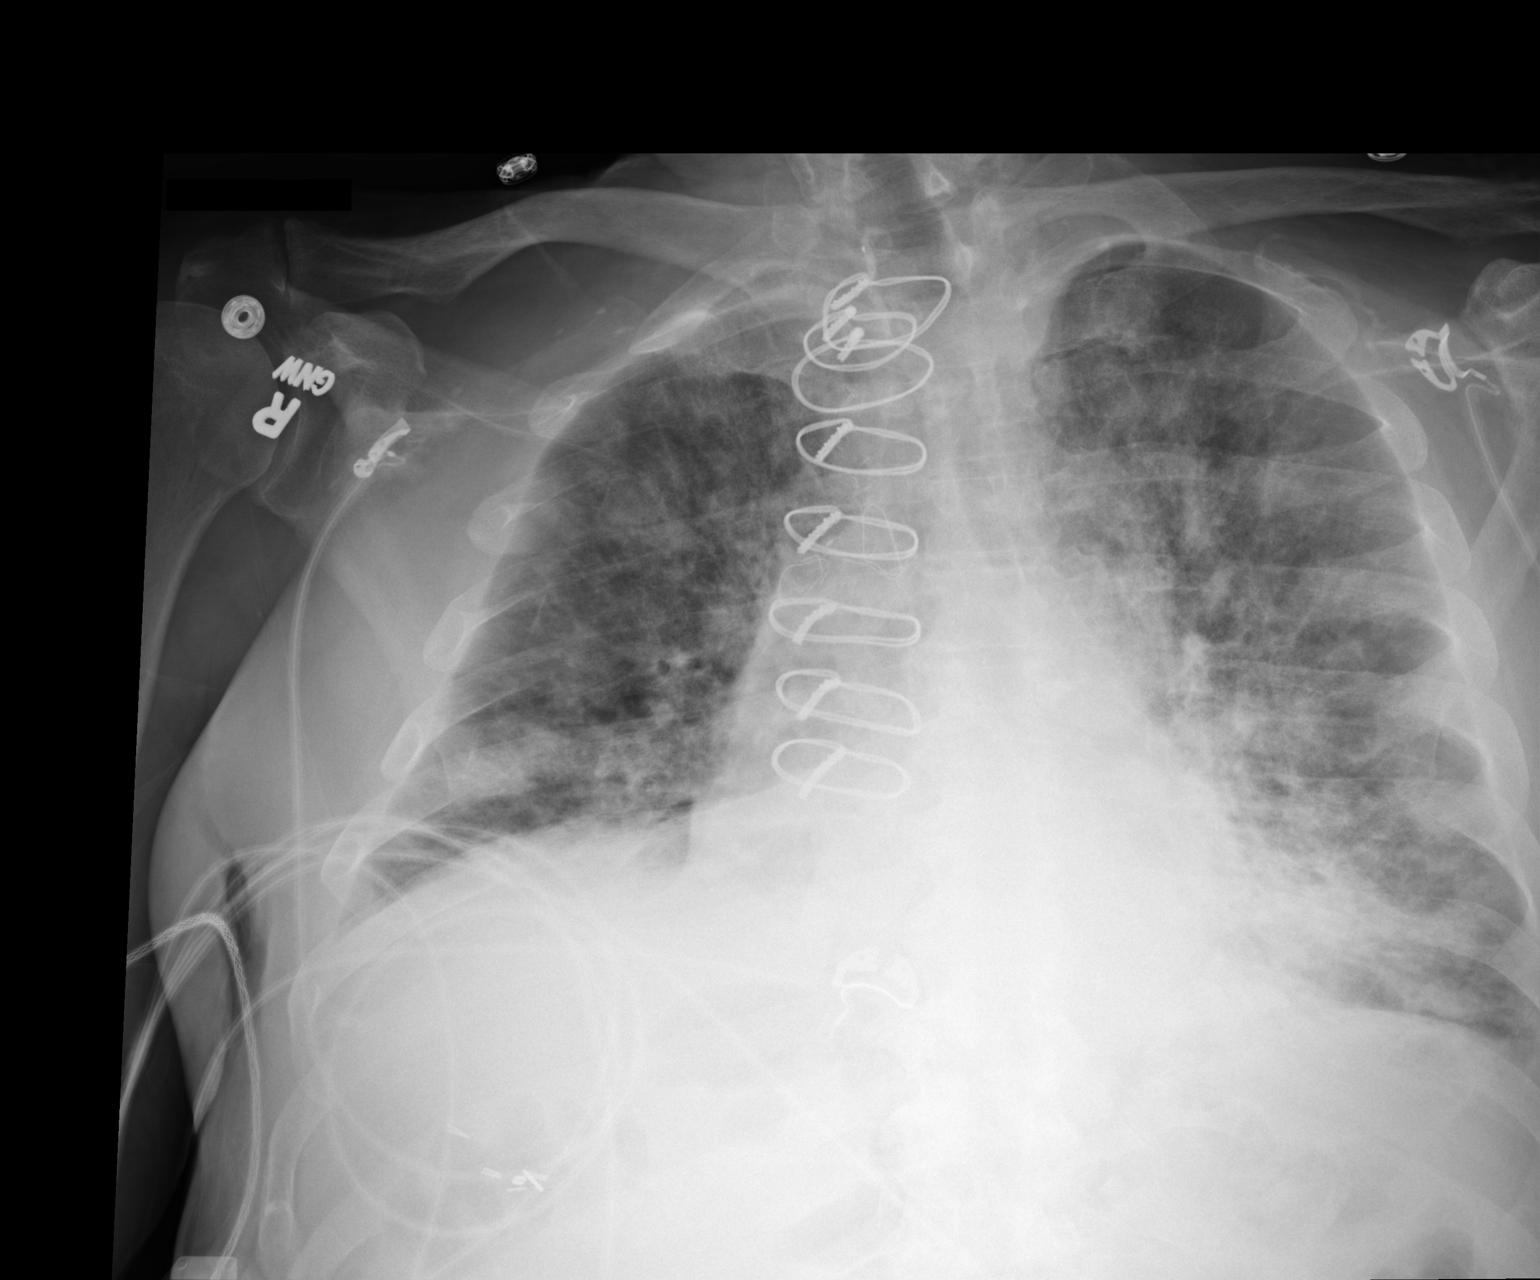

[AP (2 of 2)]
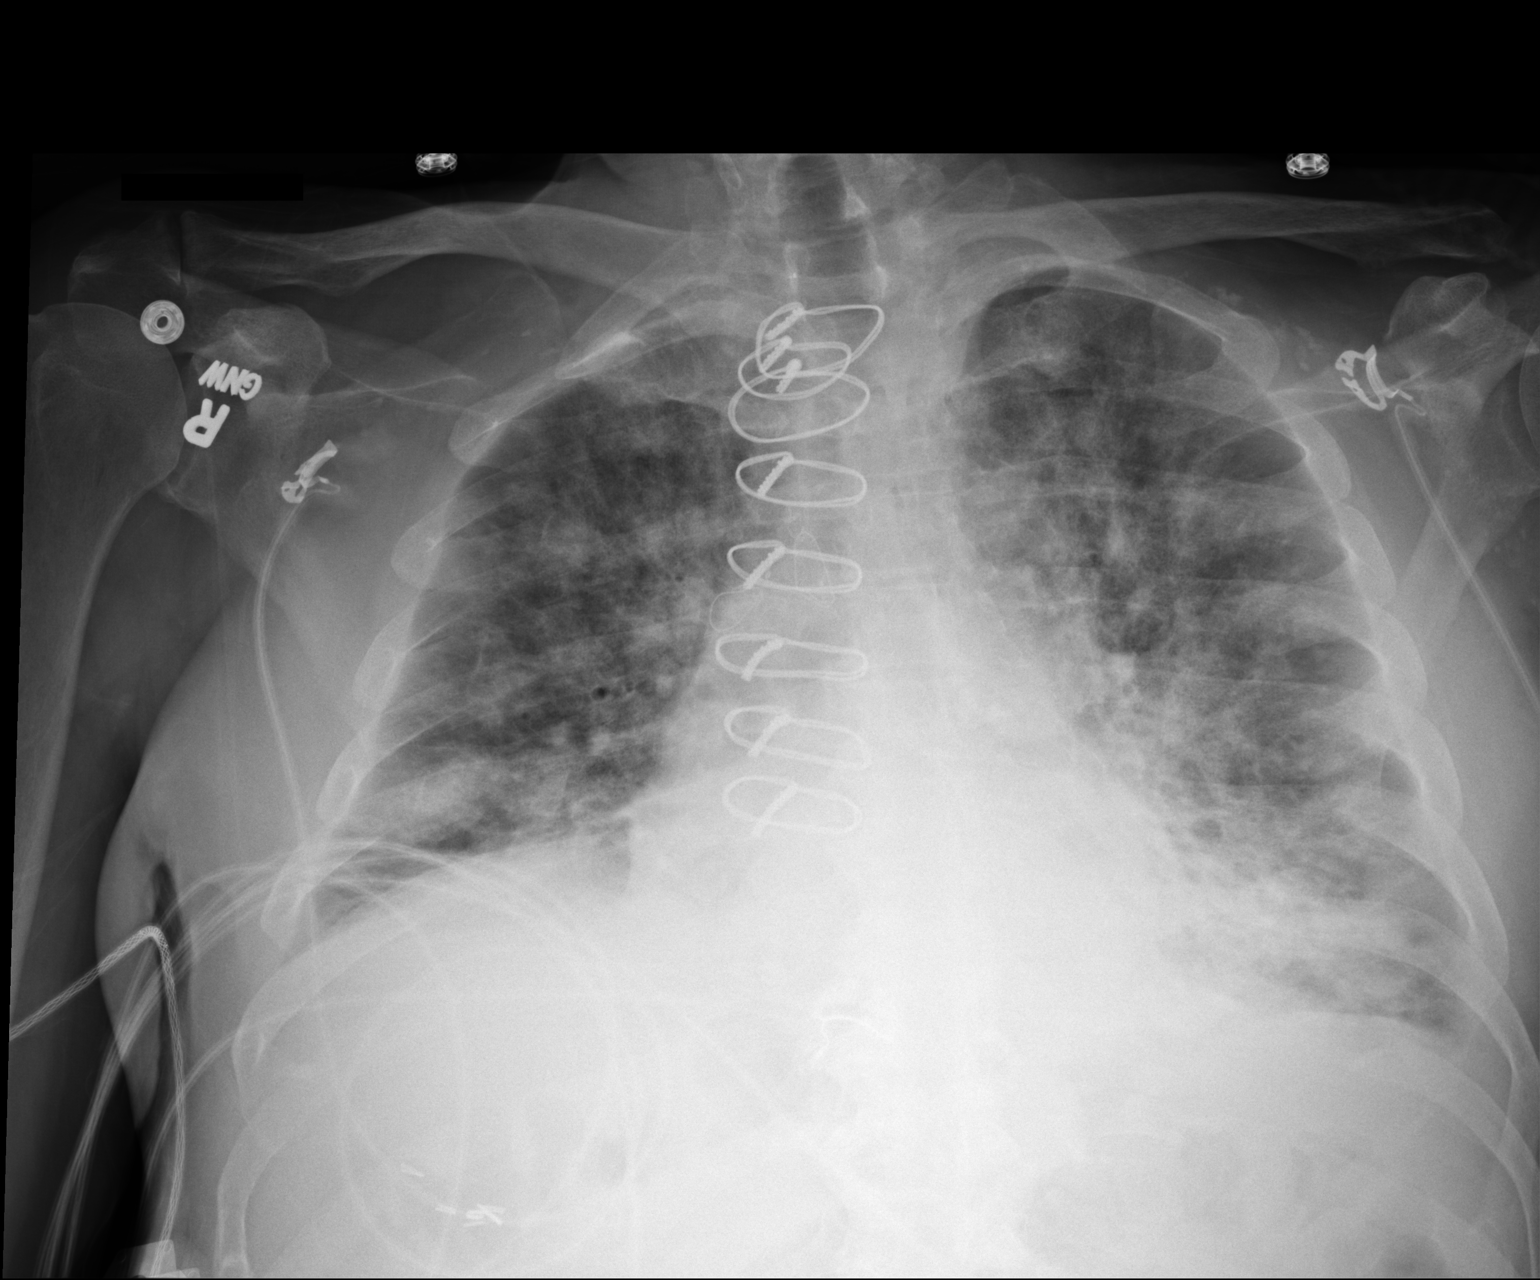

[2 of 2 positions shown; findings below may reference images not displayed]

FINDINGS: Bilateral diffuse heterogeneous airspace disease shows no
significant change since previous study. Heart size is within normal
limits. Prior median sternotomy again noted. Patient remains
partially rotated to the right.
IMPRESSION: Diffuse bilateral airspace disease, without significant change.

## 2015-11-28 IMAGING — CR DG CHEST 1V PORT
1 series · 1 of 1 positions shown · non-contrast
Comparison: 03/31/2014

CLINICAL DATA: Respiratory failure

EXAM:
PORTABLE CHEST - 1 VIEW

[AP]
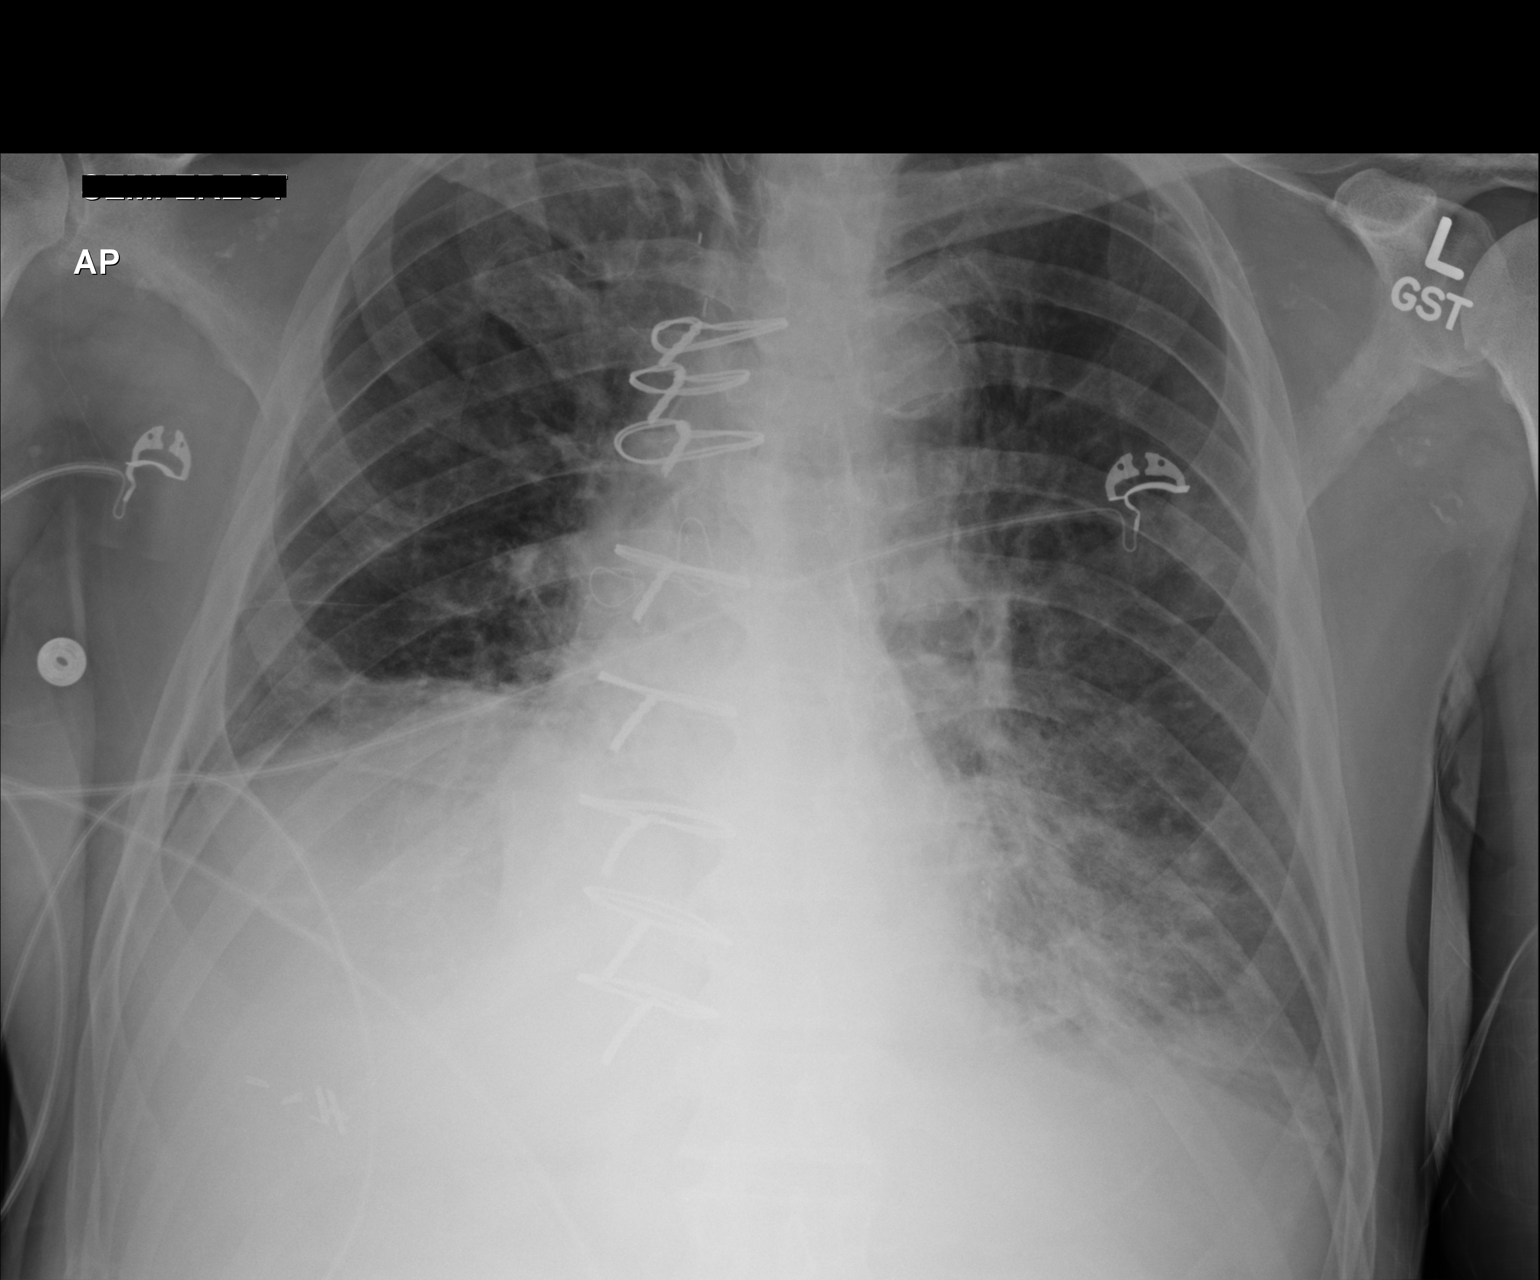

[1 of 1 positions shown; findings below may reference images not displayed]

FINDINGS: Normal heart size. Unchanged aortic tortuosity. CABG changes again
noted. Bilateral airspace disease has gradually improved since
03/27/2014. Residual opacities are mainly at the bases, especially
on the left. There is chronic elevation of the right diaphragm. No
effusion or pneumothorax.
IMPRESSION: Clearing bilateral airspace disease, with residual consolidation
mainly in the left lower lobe.

## 2015-11-29 IMAGING — CR DG CHEST 1V PORT
1 series · 1 of 1 positions shown · non-contrast
Comparison: Portable chest x-ray April 01, 2014 at [DATE] a.m.

CLINICAL DATA: ARDS, history of obesity, diabetes and coronary
artery disease

EXAM:
PORTABLE CHEST - 1 VIEW

[AP]
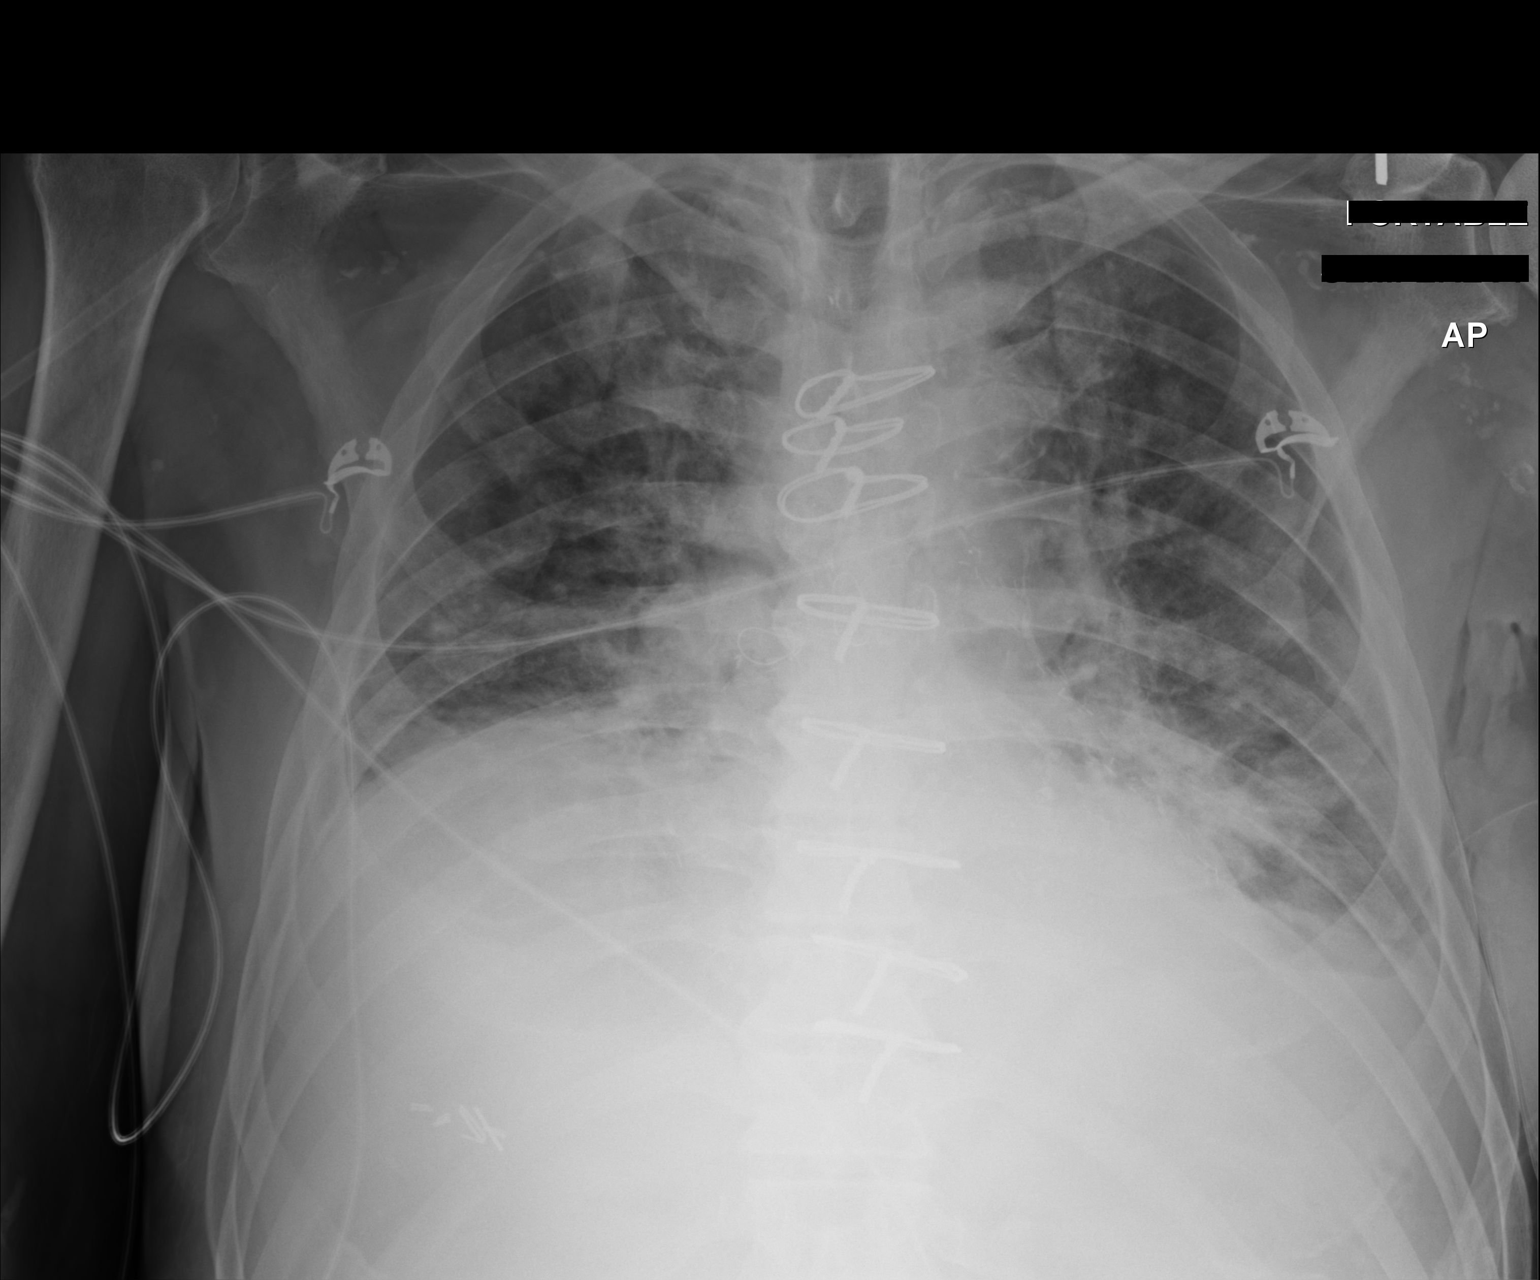

[1 of 1 positions shown; findings below may reference images not displayed]

FINDINGS: The right lung remains hypoinflated. The left lung is less well
expanded today as well. The patchy increased lung markings persist
and are slightly more conspicuous. The cardiac silhouette is not
enlarged but its margins remained indistinct. Small amounts of
pleural fluid blunt the costophrenic angles. The pulmonary
vascularity is not clearly engorged. There are 8 intact sternal
wires.
IMPRESSION: Slight interval worsening in the appearance of the pulmonary
interstitium today as compared to yesterday's study. This is in part
due to mild hypoinflation but worsening of interstitial
edema/pneumonia may be contributing to this appearance as well.

## 2015-11-30 IMAGING — CR DG CHEST 1V PORT
1 series · 1 of 1 positions shown · non-contrast
Comparison: Portable chest x-ray April 02, 2014 next annual

CLINICAL DATA: Pneumonia and shortness of breath.

EXAM:
PORTABLE CHEST - 1 VIEW

[AP]
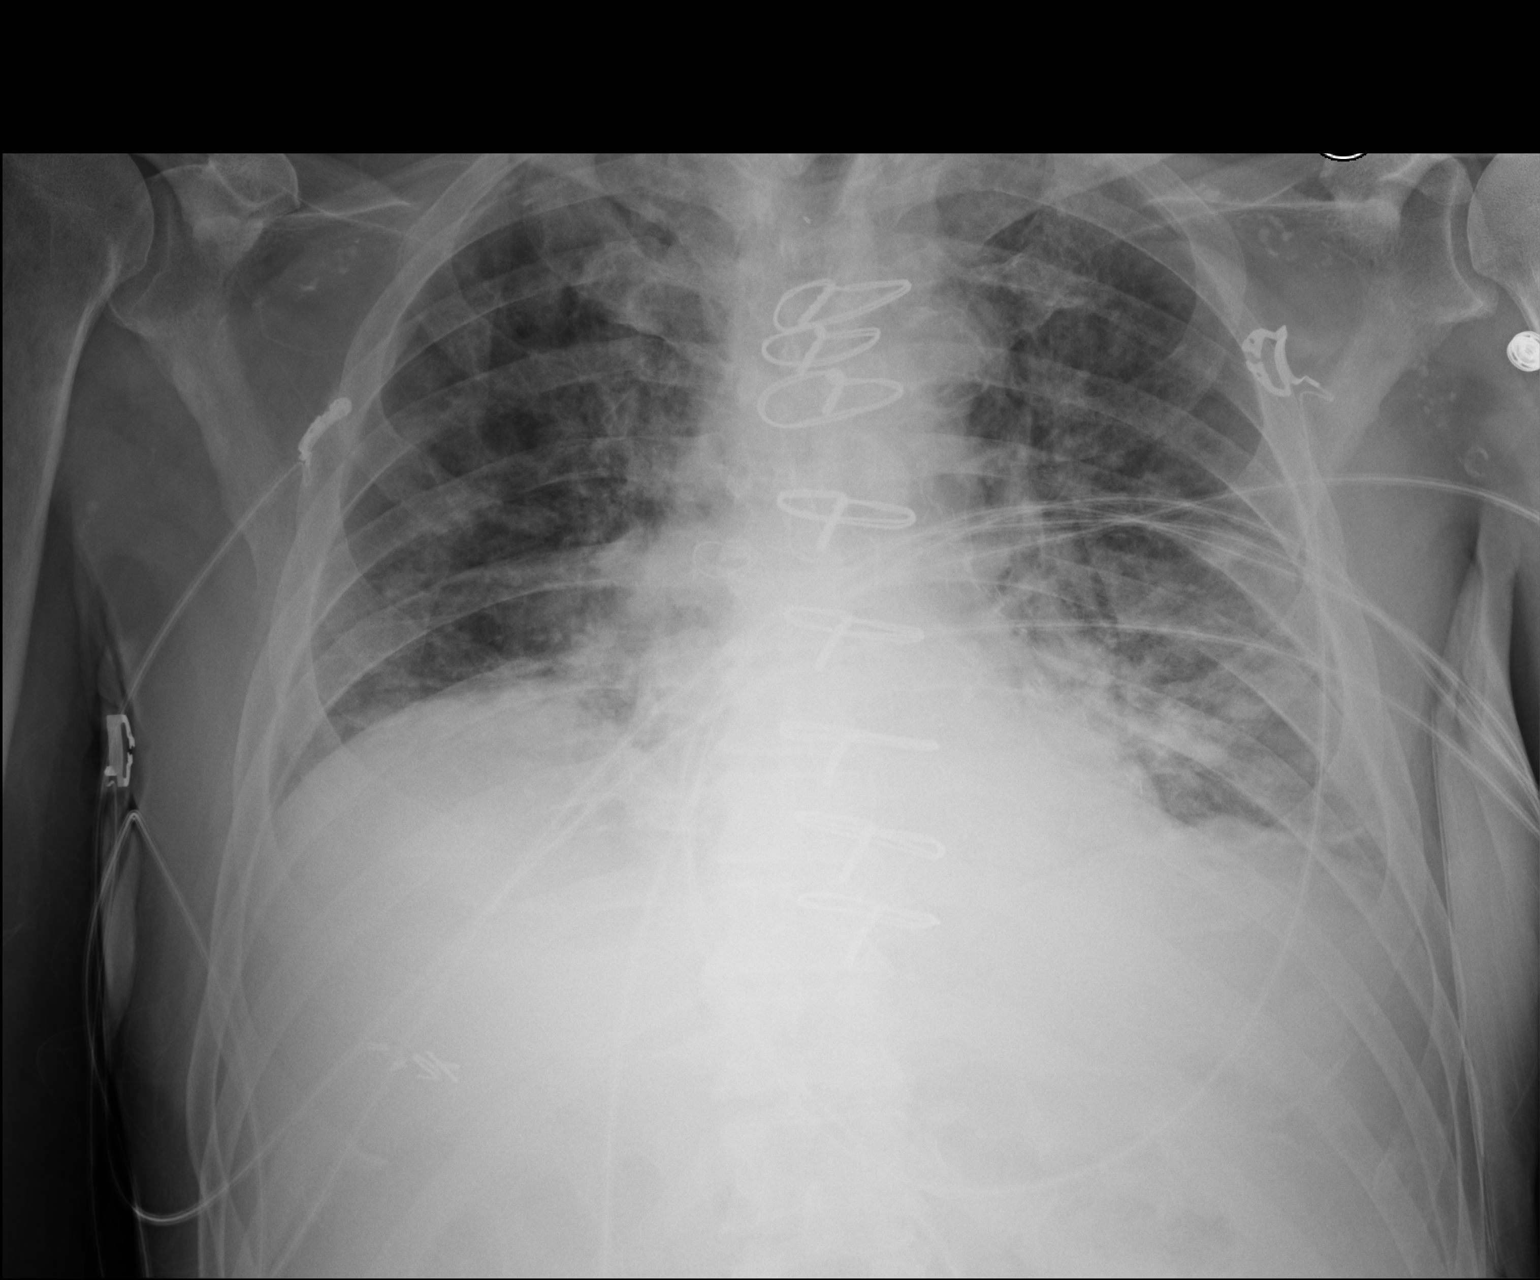

[1 of 1 positions shown; findings below may reference images not displayed]

FINDINGS: The lungs are mildly hypoinflated. The interstitial markings remain
increased. Confluent densities lie adjacent to the left heart border
and are stable. The left hemidiaphragm is slightly better
demonstrated today. The cardiac silhouette normal in size. The
pulmonary vascularity is not engorged. There are 8 intact sternal
wires from previous CABG.
IMPRESSION: There are interstitial infiltrates bilaterally in both lungs with
slight improvement noted on the right. Alveolar infiltrate in the
lingula is stable. Overall there has not been dramatic interval
change since yesterday's study.
